# Patient Record
Sex: Female | Born: 1995 | Race: Black or African American | Hispanic: No | Smoking: Former smoker
Health system: Southern US, Community
[De-identification: ages and names within clinical notes are randomized; demographics above are authoritative.]

## PROBLEM LIST (undated history)

## (undated) DIAGNOSIS — Z8041 Family history of malignant neoplasm of ovary: Secondary | ICD-10-CM

## (undated) DIAGNOSIS — G8929 Other chronic pain: Secondary | ICD-10-CM

## (undated) DIAGNOSIS — F419 Anxiety disorder, unspecified: Secondary | ICD-10-CM

## (undated) DIAGNOSIS — R87619 Unspecified abnormal cytological findings in specimens from cervix uteri: Secondary | ICD-10-CM

## (undated) DIAGNOSIS — R1011 Right upper quadrant pain: Secondary | ICD-10-CM

## (undated) DIAGNOSIS — Z808 Family history of malignant neoplasm of other organs or systems: Secondary | ICD-10-CM

## (undated) DIAGNOSIS — Z803 Family history of malignant neoplasm of breast: Secondary | ICD-10-CM

## (undated) DIAGNOSIS — R51 Headache: Secondary | ICD-10-CM

## (undated) DIAGNOSIS — F329 Major depressive disorder, single episode, unspecified: Secondary | ICD-10-CM

## (undated) DIAGNOSIS — R112 Nausea with vomiting, unspecified: Secondary | ICD-10-CM

## (undated) DIAGNOSIS — D649 Anemia, unspecified: Secondary | ICD-10-CM

## (undated) DIAGNOSIS — I10 Essential (primary) hypertension: Secondary | ICD-10-CM

## (undated) DIAGNOSIS — F32A Depression, unspecified: Secondary | ICD-10-CM

## (undated) DIAGNOSIS — Z9889 Other specified postprocedural states: Secondary | ICD-10-CM

## (undated) HISTORY — DX: Family history of malignant neoplasm of other organs or systems: Z80.8

## (undated) HISTORY — DX: Essential (primary) hypertension: I10

## (undated) HISTORY — DX: Unspecified abnormal cytological findings in specimens from cervix uteri: R87.619

## (undated) HISTORY — PX: TUMOR EXCISION: SHX421

## (undated) HISTORY — DX: Family history of malignant neoplasm of breast: Z80.3

## (undated) HISTORY — PX: WISDOM TOOTH EXTRACTION: SHX21

## (undated) HISTORY — DX: Family history of malignant neoplasm of ovary: Z80.41

---
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
## 2013-06-22 ENCOUNTER — Other Ambulatory Visit (HOSPITAL_COMMUNITY): Payer: Self-pay | Admitting: Otolaryngology

## 2013-06-22 NOTE — H&P (Signed)
[place],  Jacqueline Alvarado 17 y.o., female [id number]     Chief Complaint:   Recurrent tonsillitis, snoring, nasal obstrution  HPI: 17-year-old black female has 2 or 3 episodes per year of tonsillitis. She is now 10 days into a course of sore throat and tonsillitis with severe pain, finally slowly better. She has been on amoxicillin for the past 8 days. She does not smoke. She does have chronic allergies. She has never had a peritonsillar abscess.  She does always had some degree of nasal congestion. No facial pain. No active anterior nasal drainage. She does not recall getting treated for sinusitis very often. The last couple of days, she is having some little bit of bleeding from her throat. No breathing difficulty. No change in voice. No neck masses.  3 months recheck for preoperative visit.  She has used her standing amoxicillin at least once since we saw her last.  She tried the Flonase for one or 2 months no apparent effect.   She is accompanied by her mother today.  Mother recognizes loud snoring, but not necessarily any apneic intervals.  She feels like she rests well at night, and awakens energetic in the morning.  She has never been evaluated for possible sleep apnea.   I discussed proposed surgery namely tonsillectomy, possible adenoidectomy.  I also recommend SMR inferior turbinates bilateral.  I discussed each of these surgeries in detail including risks and complications.  Questions were answered and informed consent was obtained.  Postoperative prescriptions for oxycodone liquid and hydrocodone liquid rigid and given.  I will have her see anesthesiology at the surgical center to see if she is appropriate to do as an outpatient, or perhaps needs 23 hour overnight observation for presumptive sleep apnea.   I discussed advancement of diet and activity including return to school and work.  I discussed postoperative nasal hygiene and gave her instructions.    PMH:No past medical history on  file.  Surg Hx:No past surgical history on file.  FHx:  No family history on file. SocHx:  has no tobacco, alcohol, and drug history on file.  ALLERGIES: No Known Allergies   (Not in a hospital admission)  No results found for this or any previous visit (from the past 48 hour(s)). No results found.  ROS:Systemic: Feeling tired (fatigue).  No fever, no night sweats, and no recent weight loss. Head: Headache. Eyes: No eye symptoms. Otolaryngeal: No hearing loss  and no earache.  Tinnitus.  No purulent nasal discharge.  Nasal passage blockage (stuffiness)  and snoring.  No sneezing  and no hoarseness.  Sore throat. Cardiovascular: No chest pain or discomfort  and no palpitations. Pulmonary: No dyspnea, no cough, and no wheezing. Gastrointestinal: Dysphagia.  No heartburn.  No nausea, no abdominal pain, and no melena.  No diarrhea. Genitourinary: No dysuria. Endocrine: No muscle weakness. Musculoskeletal: No calf muscle cramps, no arthralgias, and no soft tissue swelling. Neurological: No dizziness, no fainting, no tingling, and no numbness. Psychological: No anxiety  and no depression. Skin: No rash.  BP:126/85,  HR: 78 b/min,  Height: 67.5 in, Weight: 336 lb, BMI: 51.8 kg/m2,    PHYSICAL EXAM: She is very heavyset.  Mental status is sharp.  She hears well in conversational speech.  The head is atraumatic and neck supple.  Cranial nerves intact.  Ear canals are clear with normal drums.  Anterior nose shows bulky inferior turbinates with minor septal corrugation.  Following Afrin decongestion, the airway is improved on both sides.  Oral   cavity is moist with teeth in good repair and mandible and maxilla of normal configuration.  Oropharynx shows 2-3+ tonsils with a normal soft palate.  Neck unremarkable.   Lungs: Clear to auscultation Heart: Distant, regular rate and rhythm without murmurs. Abdomen: Obese, active Extremities: Obese, normal configuration Neurologic: Grossly intact  and symmetric.    Assessment/Plan Chronic tonsillitis (474.00) (J35.01). 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  We are definitely going to take out your tonsils, and any adenoids that might be left.  I also think we should surgically reduce your nasal inferior turbinates to help with nose breathing and snoring.   If you have sleep apnea, you should stay the night overnight after the surgery.  We will have you visit with the anesthesiologist to see if they think you are safe to do at the outpatient facility.     After the surgery I want you to take either oxycodone or hydrocodone liquid pain medication.  We will remove the nasal packing one day after surgery.  I will see you back at the one week mark, and again at the two-week mark.  No strenuous activities for 2 weeks.  OxyCODONE HCl - 5 MG/5ML Oral Solution;5-10 ml po q4h prn severe pain; Qty300; R0; Rx. Hydrocodone-Acetaminophen 7.5-325 MG/15ML Oral Solution;10-20 ml po q4-6h prn pain; Qty400; R0; Rx.  Gaspar Fowle 06/22/2013, 4:36 PM     

## 2013-06-23 ENCOUNTER — Encounter (HOSPITAL_COMMUNITY)
Admission: RE | Admit: 2013-06-23 | Discharge: 2013-06-23 | Disposition: A | Discharge status: Home / Self Care | Payer: 59 | Source: Ambulatory Visit | Attending: Otolaryngology | Admitting: Otolaryngology

## 2013-06-23 ENCOUNTER — Encounter (HOSPITAL_COMMUNITY): Payer: Self-pay

## 2013-06-23 HISTORY — DX: Nausea with vomiting, unspecified: R11.2

## 2013-06-23 HISTORY — DX: Headache: R51

## 2013-06-23 HISTORY — DX: Other specified postprocedural states: Z98.890

## 2013-06-23 HISTORY — DX: Other specified postprocedural states: R11.2

## 2013-06-23 LAB — CBC
HCT: 40.4 % (ref 36.0–46.0)
Hemoglobin: 13.1 g/dL (ref 12.0–15.0)
MCH: 22.9 pg — ABNORMAL LOW (ref 26.0–34.0)
MCHC: 32.4 g/dL (ref 30.0–36.0)
RBC: 5.71 MIL/uL — ABNORMAL HIGH (ref 3.87–5.11)
RDW: 15.7 % — ABNORMAL HIGH (ref 11.5–15.5)
WBC: 9.5 10*3/uL (ref 4.0–10.5)

## 2013-06-23 MED ORDER — OXYMETAZOLINE HCL 0.05 % NA SOLN
2.0000 | NASAL | Status: DC
  Administered 2013-06-24: 2 via NASAL
  Filled 2013-06-23 (×2): qty 15

## 2013-06-23 MED ORDER — DEXAMETHASONE SODIUM PHOSPHATE 10 MG/ML IJ SOLN
10.0000 mg | Freq: Once | INTRAMUSCULAR | Status: AC
  Administered 2013-06-24: 10 mg via INTRAVENOUS
  Filled 2013-06-23: qty 1

## 2013-06-23 MED ORDER — DEXTROSE 5 % IV SOLN
3.0000 g | Freq: Once | INTRAVENOUS | Status: AC
  Administered 2013-06-24: 3 g via INTRAVENOUS
  Filled 2013-06-23: qty 3000

## 2013-06-23 NOTE — Pre-Procedure Instructions (Signed)
Jacqueline Alvarado  06/23/2013   Your procedure is scheduled on:  Friday, June 24, 2013  Report to [facility] Short Stay (use Main Entrance "A'') at 5:30 AM.  Call this number if you have problems the morning of surgery: [phone]   Remember:   Do not eat food or drink liquids after midnight.   Take these medicines the morning of surgery with A SIP OF WATER: NONE Stop taking Aspirin, vitamins and herbal medications. Do not take any NSAIDs ie: Ibuprofen, Advil, Naproxen or any medication containing Aspirin.  Do not wear jewelry, make-up or nail polish.  Do not wear lotions, powders, or perfumes. You may wear deodorant.  Do not shave 48 hours prior to surgery.   Do not bring valuables to the hospital.  [facility] is not responsible  for any belongings or valuables.               Contacts, dentures or bridgework may not be worn into surgery.  Leave suitcase in the car. After surgery it may be brought to your room.  For patients admitted to the hospital, discharge time is determined by your treatment team.               Patients discharged the day of surgery will not be allowed to drive home.  Name and phone number of your driver:   Special Instructions: Shower using CHG 2 nights before surgery and the night before surgery.  If you shower the day of surgery use CHG.  Use special wash - you have one bottle of CHG for all showers.  You should use approximately 1/3 of the bottle for each shower.   Please read over the following fact sheets that you were given: Pain Booklet, Coughing and Deep Breathing and Surgical Site Infection Prevention

## 2013-06-24 ENCOUNTER — Encounter (HOSPITAL_COMMUNITY)
Admission: RE | Disposition: A | Discharge status: Home / Self Care | Payer: Self-pay | Source: Ambulatory Visit | Attending: Otolaryngology

## 2013-06-24 ENCOUNTER — Encounter (HOSPITAL_COMMUNITY): Payer: Self-pay | Admitting: *Deleted

## 2013-06-24 ENCOUNTER — Observation Stay (HOSPITAL_COMMUNITY)
Admission: RE | Admit: 2013-06-24 | Discharge: 2013-06-25 | Disposition: A | Discharge status: Home / Self Care | Payer: 59 | Source: Ambulatory Visit | Attending: Otolaryngology | Admitting: Otolaryngology

## 2013-06-24 ENCOUNTER — Encounter (HOSPITAL_COMMUNITY): Payer: 59 | Admitting: Anesthesiology

## 2013-06-24 ENCOUNTER — Ambulatory Visit (HOSPITAL_COMMUNITY): Payer: 59 | Admitting: Anesthesiology

## 2013-06-24 DIAGNOSIS — J351 Hypertrophy of tonsils: Secondary | ICD-10-CM | POA: Diagnosis present

## 2013-06-24 DIAGNOSIS — G4733 Obstructive sleep apnea (adult) (pediatric): Secondary | ICD-10-CM | POA: Insufficient documentation

## 2013-06-24 DIAGNOSIS — R0609 Other forms of dyspnea: Secondary | ICD-10-CM | POA: Insufficient documentation

## 2013-06-24 DIAGNOSIS — J3501 Chronic tonsillitis: Principal | ICD-10-CM | POA: Insufficient documentation

## 2013-06-24 DIAGNOSIS — R0989 Other specified symptoms and signs involving the circulatory and respiratory systems: Secondary | ICD-10-CM | POA: Insufficient documentation

## 2013-06-24 DIAGNOSIS — J343 Hypertrophy of nasal turbinates: Secondary | ICD-10-CM | POA: Diagnosis present

## 2013-06-24 HISTORY — PX: NASAL SEPTOPLASTY W/ TURBINOPLASTY: SHX2070

## 2013-06-24 HISTORY — PX: TONSILLECTOMY AND ADENOIDECTOMY: SHX28

## 2013-06-24 SURGERY — TONSILLECTOMY AND ADENOIDECTOMY
Anesthesia: General | Site: Throat | Laterality: Bilateral

## 2013-06-24 MED ORDER — ONDANSETRON HCL 4 MG/2ML IJ SOLN
4.0000 mg | INTRAMUSCULAR | Status: DC | PRN
  Administered 2013-06-24 (×2): 4 mg via INTRAVENOUS
  Filled 2013-06-24 (×2): qty 2

## 2013-06-24 MED ORDER — LIDOCAINE-EPINEPHRINE (PF) 1 %-1:200000 IJ SOLN
INTRAMUSCULAR | Status: AC
  Filled 2013-06-24: qty 10

## 2013-06-24 MED ORDER — GLYCOPYRROLATE 0.2 MG/ML IJ SOLN
INTRAMUSCULAR | Status: DC | PRN
  Administered 2013-06-24: .8 mg via INTRAVENOUS

## 2013-06-24 MED ORDER — LIDOCAINE-EPINEPHRINE 0.5 %-1:200000 IJ SOLN
INTRAMUSCULAR | Status: AC
  Filled 2013-06-24: qty 1

## 2013-06-24 MED ORDER — ARTIFICIAL TEARS OP OINT
TOPICAL_OINTMENT | OPHTHALMIC | Status: DC | PRN
  Administered 2013-06-24: 1 via OPHTHALMIC

## 2013-06-24 MED ORDER — LACTATED RINGERS IV SOLN
INTRAVENOUS | Status: DC | PRN
  Administered 2013-06-24 (×2): via INTRAVENOUS

## 2013-06-24 MED ORDER — CEPHALEXIN 250 MG/5ML PO SUSR
500.0000 mg | Freq: Four times a day (QID) | ORAL | Status: DC
  Administered 2013-06-24 – 2013-06-25 (×3): 500 mg via ORAL
  Filled 2013-06-24 (×7): qty 10

## 2013-06-24 MED ORDER — LIDOCAINE HCL (CARDIAC) 20 MG/ML IV SOLN
INTRAVENOUS | Status: DC | PRN
  Administered 2013-06-24: 50 mg via INTRAVENOUS

## 2013-06-24 MED ORDER — BACITRACIN ZINC 500 UNIT/GM EX OINT
TOPICAL_OINTMENT | CUTANEOUS | Status: AC
  Filled 2013-06-24: qty 15

## 2013-06-24 MED ORDER — ACETAMINOPHEN 160 MG/5ML PO SOLN: 325.0000 mg | ORAL | Status: DC | PRN

## 2013-06-24 MED ORDER — DIPHENHYDRAMINE HCL 50 MG/ML IJ SOLN
25.0000 mg | Freq: Four times a day (QID) | INTRAMUSCULAR | Status: DC | PRN
  Administered 2013-06-24: 25 mg via INTRAVENOUS
  Filled 2013-06-24: qty 1

## 2013-06-24 MED ORDER — OXYCODONE HCL 5 MG/5ML PO SOLN
ORAL | Status: AC
  Filled 2013-06-24: qty 5

## 2013-06-24 MED ORDER — MORPHINE SULFATE 2 MG/ML IJ SOLN
1.0000 mg | INTRAMUSCULAR | Status: DC | PRN
  Administered 2013-06-24 (×4): 2 mg via INTRAVENOUS
  Filled 2013-06-24 (×3): qty 1

## 2013-06-24 MED ORDER — LIDOCAINE-EPINEPHRINE 0.5 %-1:200000 IJ SOLN
INTRAMUSCULAR | Status: DC | PRN
  Administered 2013-06-24: 16 mL

## 2013-06-24 MED ORDER — 0.9 % SODIUM CHLORIDE (POUR BTL) OPTIME
TOPICAL | Status: DC | PRN
  Administered 2013-06-24: 1000 mL

## 2013-06-24 MED ORDER — HYDROCODONE-ACETAMINOPHEN 7.5-325 MG/15ML PO SOLN
10.0000 mL | ORAL | Status: DC | PRN
  Administered 2013-06-24 (×2): 15 mL via ORAL
  Administered 2013-06-25 (×2): 20 mL via ORAL
  Filled 2013-06-24: qty 15
  Filled 2013-06-24 (×2): qty 30
  Filled 2013-06-24: qty 15

## 2013-06-24 MED ORDER — OXYCODONE-ACETAMINOPHEN 5-325 MG/5ML PO SOLN: 5.0000 mL | ORAL | Status: DC | PRN

## 2013-06-24 MED ORDER — HYDROMORPHONE HCL PF 1 MG/ML IJ SOLN
INTRAMUSCULAR | Status: AC
  Administered 2013-06-24: 0.5 mg via INTRAVENOUS
  Filled 2013-06-24: qty 2

## 2013-06-24 MED ORDER — HYDROMORPHONE HCL PF 1 MG/ML IJ SOLN
0.2500 mg | INTRAMUSCULAR | Status: DC | PRN
  Administered 2013-06-24 (×4): 0.5 mg via INTRAVENOUS

## 2013-06-24 MED ORDER — ONDANSETRON HCL 4 MG/2ML IJ SOLN
INTRAMUSCULAR | Status: DC | PRN
  Administered 2013-06-24: 4 mg via INTRAVENOUS

## 2013-06-24 MED ORDER — MIDAZOLAM HCL 5 MG/5ML IJ SOLN
INTRAMUSCULAR | Status: DC | PRN
  Administered 2013-06-24 (×2): 1 mg via INTRAVENOUS

## 2013-06-24 MED ORDER — DEXTROSE-NACL 5-0.45 % IV SOLN
INTRAVENOUS | Status: DC
  Administered 2013-06-24: 15:00:00 via INTRAVENOUS

## 2013-06-24 MED ORDER — ONDANSETRON HCL 4 MG PO TABS: 4.0000 mg | ORAL_TABLET | ORAL | Status: DC | PRN

## 2013-06-24 MED ORDER — ONDANSETRON HCL 4 MG/2ML IJ SOLN
4.0000 mg | Freq: Once | INTRAMUSCULAR | Status: AC | PRN
  Administered 2013-06-24: 4 mg via INTRAVENOUS

## 2013-06-24 MED ORDER — ONDANSETRON HCL 4 MG/2ML IJ SOLN: 4.0000 mg | Freq: Four times a day (QID) | INTRAMUSCULAR | Status: DC | PRN

## 2013-06-24 MED ORDER — HYDROCODONE-ACETAMINOPHEN 7.5-325 MG/15ML PO SOLN
ORAL | Status: AC
  Filled 2013-06-24: qty 15

## 2013-06-24 MED ORDER — PHENYLEPHRINE HCL 10 MG/ML IJ SOLN
INTRAMUSCULAR | Status: DC | PRN
  Administered 2013-06-24: 40 ug via INTRAVENOUS

## 2013-06-24 MED ORDER — PROPOFOL 10 MG/ML IV BOLUS
INTRAVENOUS | Status: DC | PRN
  Administered 2013-06-24: 180 mg via INTRAVENOUS

## 2013-06-24 MED ORDER — NEOSTIGMINE METHYLSULFATE 1 MG/ML IJ SOLN
INTRAMUSCULAR | Status: DC | PRN
  Administered 2013-06-24: 5 mg via INTRAVENOUS

## 2013-06-24 MED ORDER — FENTANYL CITRATE 0.05 MG/ML IJ SOLN
INTRAMUSCULAR | Status: DC | PRN
  Administered 2013-06-24 (×3): 50 ug via INTRAVENOUS
  Administered 2013-06-24: 100 ug via INTRAVENOUS
  Administered 2013-06-24 (×3): 50 ug via INTRAVENOUS

## 2013-06-24 MED ORDER — ONDANSETRON HCL 4 MG/2ML IJ SOLN
INTRAMUSCULAR | Status: AC
  Filled 2013-06-24: qty 2

## 2013-06-24 MED ORDER — ROCURONIUM BROMIDE 100 MG/10ML IV SOLN
INTRAVENOUS | Status: DC | PRN
  Administered 2013-06-24: 10 mg via INTRAVENOUS
  Administered 2013-06-24: 15 mg via INTRAVENOUS
  Administered 2013-06-24: 50 mg via INTRAVENOUS

## 2013-06-24 MED ORDER — MORPHINE SULFATE 2 MG/ML IJ SOLN
INTRAMUSCULAR | Status: AC
  Filled 2013-06-24: qty 1

## 2013-06-24 MED ORDER — BACITRACIN ZINC 500 UNIT/GM EX OINT
TOPICAL_OINTMENT | CUTANEOUS | Status: DC | PRN
  Administered 2013-06-24: 1 via TOPICAL

## 2013-06-24 MED ORDER — LIDOCAINE HCL 4 % MT SOLN
OROMUCOSAL | Status: DC | PRN
  Administered 2013-06-24: 4 mL via TOPICAL

## 2013-06-24 MED ORDER — OXYMETAZOLINE HCL 0.05 % NA SOLN
NASAL | Status: DC | PRN
  Administered 2013-06-24: 1 via NASAL

## 2013-06-24 SURGICAL SUPPLY — 52 items
ATTRACTOMAT 16X20 MAGNETIC DRP (DRAPES) IMPLANT
BLADE TRICUT ROTATE M4 4 5PK (BLADE) IMPLANT
CANISTER SUCTION 2500CC (MISCELLANEOUS) ×6 IMPLANT
CATH ROBINSON RED A/P 10FR (CATHETERS) ×3 IMPLANT
CLEANER TIP ELECTROSURG 2X2 (MISCELLANEOUS) ×3 IMPLANT
CLOTH BEACON ORANGE TIMEOUT ST (SAFETY) ×3 IMPLANT
COAGULATOR SUCT SWTCH 10FR 6 (ELECTROSURGICAL) ×3 IMPLANT
COTTONBALL LRG STERILE PKG (GAUZE/BANDAGES/DRESSINGS) ×3 IMPLANT
CRADLE DONUT ADULT HEAD (MISCELLANEOUS) IMPLANT
DECANTER SPIKE VIAL GLASS SM (MISCELLANEOUS) ×3 IMPLANT
DRESSING TELFA 8X3 (GAUZE/BANDAGES/DRESSINGS) ×3 IMPLANT
DRSG NASOPORE 8CM (GAUZE/BANDAGES/DRESSINGS) IMPLANT
ELECT COATED BLADE 2.86 ST (ELECTRODE) ×3 IMPLANT
ELECT REM PT RETURN 9FT ADLT (ELECTROSURGICAL) ×3
ELECT REM PT RETURN 9FT PED (ELECTROSURGICAL)
ELECTRODE REM PT RETRN 9FT PED (ELECTROSURGICAL) IMPLANT
ELECTRODE REM PT RTRN 9FT ADLT (ELECTROSURGICAL) ×2 IMPLANT
FILTER ARTHROSCOPY CONVERTOR (FILTER) IMPLANT
GAUZE PACKING FOLDED 2  STR (GAUZE/BANDAGES/DRESSINGS) ×1
GAUZE PACKING FOLDED 2 STR (GAUZE/BANDAGES/DRESSINGS) ×2 IMPLANT
GAUZE SPONGE 2X2 8PLY STRL LF (GAUZE/BANDAGES/DRESSINGS) IMPLANT
GAUZE SPONGE 4X4 16PLY XRAY LF (GAUZE/BANDAGES/DRESSINGS) ×3 IMPLANT
GEL ULTRASOUND 20GR AQUASONIC (MISCELLANEOUS) ×3 IMPLANT
GLOVE ECLIPSE 8.0 STRL XLNG CF (GLOVE) ×6 IMPLANT
GOWN PREVENTION PLUS XLARGE (GOWN DISPOSABLE) ×6 IMPLANT
GOWN STRL NON-REIN LRG LVL3 (GOWN DISPOSABLE) ×6 IMPLANT
KIT BASIN OR (CUSTOM PROCEDURE TRAY) ×6 IMPLANT
KIT ROOM TURNOVER OR (KITS) ×6 IMPLANT
NEEDLE SPNL 22GX3.5 QUINCKE BK (NEEDLE) ×3 IMPLANT
NEEDLE SPNL 25GX3.5 QUINCKE BL (NEEDLE) ×3 IMPLANT
NS IRRIG 1000ML POUR BTL (IV SOLUTION) ×6 IMPLANT
PACK SURGICAL SETUP 50X90 (CUSTOM PROCEDURE TRAY) ×3 IMPLANT
PAD ARMBOARD 7.5X6 YLW CONV (MISCELLANEOUS) ×12 IMPLANT
PATTIES SURGICAL .5 X3 (DISPOSABLE) ×3 IMPLANT
PENCIL FOOT CONTROL (ELECTRODE) ×3 IMPLANT
SHEET SIL 040 (INSTRUMENTS) ×3 IMPLANT
SPECIMEN JAR SMALL (MISCELLANEOUS) ×6 IMPLANT
SPONGE GAUZE 2X2 STER 10/PKG (GAUZE/BANDAGES/DRESSINGS)
SPONGE TONSIL 1 RF SGL (DISPOSABLE) ×3 IMPLANT
SUT CHROMIC 4 0 P 3 18 (SUTURE) ×3 IMPLANT
SUT ETHILON 3 0 PS 1 (SUTURE) ×3 IMPLANT
SUT PDS AB 4-0 P3 18 (SUTURE) ×3 IMPLANT
SUT PLAIN 4 0 ~~LOC~~ 1 (SUTURE) IMPLANT
SYR BULB 3OZ (MISCELLANEOUS) ×3 IMPLANT
SYR CONTROL 10ML LL (SYRINGE) ×3 IMPLANT
TOWEL OR 17X24 6PK STRL BLUE (TOWEL DISPOSABLE) ×6 IMPLANT
TRAY ENT MC OR (CUSTOM PROCEDURE TRAY) ×3 IMPLANT
TUBE CONNECTING 12X1/4 (SUCTIONS) ×3 IMPLANT
TUBE SALEM SUMP 14F W/ARV (TUBING) ×3 IMPLANT
TUBE SALEM SUMP 16 FR W/ARV (TUBING) IMPLANT
WATER STERILE IRR 1000ML POUR (IV SOLUTION) ×6 IMPLANT
YANKAUER SUCT BULB TIP NO VENT (SUCTIONS) ×3 IMPLANT

## 2013-06-24 NOTE — Progress Notes (Signed)
Utilization review completed.  

## 2013-06-24 NOTE — H&P (View-Only) (Signed)
Jacqueline Alvarado, [place] 17 y.o., female [id number]     Chief Complaint:   Recurrent tonsillitis, snoring, nasal obstrution  HPI: 17 year old black female has 2 or 3 episodes per year of tonsillitis. She is now 10 days into a course of sore throat and tonsillitis with severe pain, finally slowly better. She has been on amoxicillin for the past 8 days. She does not smoke. She does have chronic allergies. She has never had a peritonsillar abscess.  She does always had some degree of nasal congestion. No facial pain. No active anterior nasal drainage. She does not recall getting treated for sinusitis very often. The last couple of days, she is having some little bit of bleeding from her throat. No breathing difficulty. No change in voice. No neck masses.  3 months recheck for preoperative visit.  She has used her standing amoxicillin at least once since we saw her last.  She tried the [facility] for one or 2 months no apparent effect.   She is accompanied by her mother today.  Mother recognizes loud snoring, but not necessarily any apneic intervals.  She feels like she rests well at night, and awakens energetic in the morning.  She has never been evaluated for possible sleep apnea.   I discussed proposed surgery namely tonsillectomy, possible adenoidectomy.  I also recommend SMR inferior turbinates bilateral.  I discussed each of these surgeries in detail including risks and complications.  Questions were answered and informed consent was obtained.  Postoperative prescriptions for oxycodone liquid and hydrocodone liquid rigid and given.  I will have her see anesthesiology at the surgical center to see if she is appropriate to do as an outpatient, or perhaps needs 23 hour overnight observation for presumptive sleep apnea.   I discussed advancement of diet and activity including return to school and work.  I discussed postoperative nasal hygiene and gave her instructions.    PMH:No past medical history on  file.  Surg Hx:No past surgical history on file.  FHx:  No family history on file. SocHx:  has no tobacco, alcohol, and drug history on file.  ALLERGIES: No Known Allergies   (Not in a hospital admission)  No results found for this or any previous visit (from the past 48 hour(s)). No results found.  WUJ:WJXBJYNW: Feeling tired (fatigue).  No fever, no night sweats, and no recent weight loss. Head: Headache. Eyes: No eye symptoms. Otolaryngeal: No hearing loss  and no earache.  Tinnitus.  No purulent nasal discharge.  Nasal passage blockage (stuffiness)  and snoring.  No sneezing  and no hoarseness.  Sore throat. Cardiovascular: No chest pain or discomfort  and no palpitations. Pulmonary: No dyspnea, no cough, and no wheezing. Gastrointestinal: Dysphagia.  No heartburn.  No nausea, no abdominal pain, and no melena.  No diarrhea. Genitourinary: No dysuria. Endocrine: No muscle weakness. Musculoskeletal: No calf muscle cramps, no arthralgias, and no soft tissue swelling. Neurological: No dizziness, no fainting, no tingling, and no numbness. Psychological: No anxiety  and no depression. Skin: No rash.  BP:126/85,  HR: 78 b/min,  Height: 67.5 in, Weight: 336 lb, BMI: 51.8 kg/m2,    PHYSICAL EXAM: She is very heavyset.  Mental status is sharp.  She hears well in conversational speech.  The head is atraumatic and neck supple.  Cranial nerves intact.  Ear canals are clear with normal drums.  Anterior nose shows bulky inferior turbinates with minor septal corrugation.  Following Afrin decongestion, the airway is improved on both sides.  Oral  cavity is moist with teeth in good repair and mandible and maxilla of normal configuration.  Oropharynx shows 2-3+ tonsils with a normal soft palate.  Neck unremarkable.   Lungs: Clear to auscultation Heart: Distant, regular rate and rhythm without murmurs. Abdomen: Obese, active Extremities: Obese, normal configuration Neurologic: Grossly intact  and symmetric.    Assessment/Plan Chronic tonsillitis (474.00) (J35.01). 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  We are definitely going to take out your tonsils, and any adenoids that might be left.  I also think we should surgically reduce your nasal inferior turbinates to help with nose breathing and snoring.   If you have sleep apnea, you should stay the night overnight after the surgery.  We will have you visit with the anesthesiologist to see if they think you are safe to do at the outpatient facility.     After the surgery I want you to take either oxycodone or hydrocodone liquid pain medication.  We will remove the nasal packing one day after surgery.  I will see you back at the one week mark, and again at the two-week mark.  No strenuous activities for 2 weeks.  OxyCODONE HCl - 5 MG/5ML Oral Solution;5-10 ml po q4h prn severe pain; Qty300; R0; Rx. Hydrocodone-Acetaminophen 7.5-325 MG/15ML Oral Solution;10-20 ml po q4-6h prn pain; Qty400; R0; Rx.  Flo Shanks 06/22/2013, 4:36 PM

## 2013-06-24 NOTE — Anesthesia Preprocedure Evaluation (Addendum)
Anesthesia Evaluation  Patient identified by MRN, date of birth, ID band Patient awake    Reviewed: Allergy & Precautions, H&P , NPO status , Patient's Chart, lab work & pertinent test results  History of Anesthesia Complications (+) PONV and history of anesthetic complications  Airway Mallampati: II TM Distance: >3 FB Neck ROM: Full    Dental  (+) Teeth Intact and Dental Advisory Given   Pulmonary  breath sounds clear to auscultation        Cardiovascular Rhythm:Regular Rate:Normal     Neuro/Psych  Headaches,    GI/Hepatic   Endo/Other  Morbid obesity  Renal/GU      Musculoskeletal   Abdominal   Peds  Hematology   Anesthesia Other Findings   Reproductive/Obstetrics                          Anesthesia Physical Anesthesia Plan  ASA: III  Anesthesia Plan: General   Post-op Pain Management:    Induction: Intravenous  Airway Management Planned: Oral ETT  Additional Equipment:   Intra-op Plan:   Post-operative Plan: Extubation in OR  Informed Consent: I have reviewed the patients History and Physical, chart, labs and discussed the procedure including the risks, benefits and alternatives for the proposed anesthesia with the patient or authorized representative who has indicated his/her understanding and acceptance.   Dental advisory given  Plan Discussed with: Anesthesiologist and Surgeon  Anesthesia Plan Comments: (Chronic tonsillitis Obesity Nasal septal deviation  Plan GA with oral ETT  Kipp Brood, MD)       Anesthesia Quick Evaluation

## 2013-06-24 NOTE — Interval H&P Note (Signed)
History and Physical Interval Note:  06/24/2013 7:35 AM  Jacqueline Alvarado  has presented today for surgery, with the diagnosis of chronic tonsilitis  The various methods of treatment have been discussed with the patient and family. After consideration of risks, benefits and other options for treatment, the patient has consented to  Procedure(s): TONSILLECTOMY AND ADENOIDECTOMY (Bilateral) TURBINATE REDUCTION (Bilateral) as a surgical intervention .  The patient's history has been re-reviewed, patient re-examined, no change in status, stable for surgery.  I have re-reviewed the patient's chart and labs.  Questions were answered to the patient's satisfaction.     Flo Shanks

## 2013-06-24 NOTE — Progress Notes (Signed)
Called on-call Dr to report skipped beats; more than rare; occasional. Mostly SR, but ectopy noticed. No new orders; will continue to monitor.

## 2013-06-24 NOTE — Anesthesia Procedure Notes (Signed)
Procedure Name: Intubation Date/Time: 06/24/2013 7:51 AM Performed by: Gayla Medicus Pre-anesthesia Checklist: Patient identified, Patient being monitored, Emergency Drugs available, Timeout performed and Suction available Patient Re-evaluated:Patient Re-evaluated prior to inductionOxygen Delivery Method: Circle system utilized Preoxygenation: Pre-oxygenation with 100% oxygen Intubation Type: IV induction Ventilation: Mask ventilation without difficulty, Oral airway inserted - appropriate to patient size and Two handed mask ventilation required Laryngoscope Size: Mac and 3 Grade View: Grade I Tube type: Oral Tube size: 7.0 mm Number of attempts: 1 Airway Equipment and Method: Stylet and LTA kit utilized Placement Confirmation: ETT inserted through vocal cords under direct vision,  positive ETCO2 and breath sounds checked- equal and bilateral Secured at: 22 cm Tube secured with: Tape Dental Injury: Teeth and Oropharynx as per pre-operative assessment

## 2013-06-24 NOTE — Transfer of Care (Signed)
Immediate Anesthesia Transfer of Care Note  Patient: Jacqueline Alvarado  Procedure(s) Performed: Procedure(s): TONSILLECTOMY  (Bilateral) TURBINATE REDUCTION (Bilateral)  Patient Location: PACU  Anesthesia Type:General  Level of Consciousness: awake, alert  and oriented  Airway & Oxygen Therapy: Patient Spontanous Breathing and Patient connected to face mask oxygen  Post-op Assessment: Report given to PACU RN, Post -op Vital signs reviewed and stable and Patient moving all extremities X 4  Post vital signs: Reviewed and stable  Complications: No apparent anesthesia complications

## 2013-06-24 NOTE — Preoperative (Signed)
Beta Blockers   Reason not to administer Beta Blockers:Not Applicable 

## 2013-06-24 NOTE — Op Note (Signed)
06/24/2013  9:37 AM    Harland Dingwall, Zigmund Daniel  [id number]   Pre-Op Dx:  Chronic tonsillitis with hypertrophy. Inferior turbinate hypertrophy with obstruction. Morbid obesity. Obstructive sleep apnea. Or or  Post-op Dx: same  Proc: Bilateral SMR inferior turbinates. Tonsillectomy   Surg:  Flo Shanks T MD  Anes:  GOT  EBL:  50  ml  Comp:  none  Findings:  Relatively straight nasal septum.  Bulky inferior turbinates.  2+ tonsils with extremely difficult access owing to obesity, small and short mandible, very bulky tongue.  No residual adenoids.  Procedure:   with the patient in a comfortable supine position, having received preoperative Afrin spray, general orotracheal anesthesia was induced with some difficulty owing to her anatomy including small short mandible, bulky tongue, and obesity.  At an adequate level, the table was turned 90 and  the patient placed in a semisitting position.  The nose was examined with the findings as scribed above. A saline moistened throat pack was placed. Afrin moistened pledgets were applied along the septum and turbinates on both sides for intraoperative hemostasis. 1/2% Xylocaine with 1-200,000 epinephrine, 6 cc total was infiltrated into the inferior turbinates using a 25-gauge spinal needle. Several minutes were allowed for this to take effect.  Beginning on the right side, the anterior hood of the inferior turbinate was sharply lysed just behind the nasal valve. The medial mucosa was sharply incised and a laterally based flap was developed. The turbinate was infractured. Using angled turbinate scissors, turbinate bone and lateral mucosa were resected in a posterior downsloping fashion leaving virtually all the posterior pole and taking virtually all the anterior pole. Bony spicules were removed submucosally. The cut mucosal edges were suction coagulated the flap was laid back down and the turbinate was outfractured. This completed the right inferior  turbinate submucosal resection.  The left side was performed in identical fashion.  A double thickness of Telfa packing was fashioned and impregnated with bacitracin ointment and applied against the turbinate on both sides. Nasal trumpets were placed after first shortening them to allow some postoperative airway.  This completed the nasal procedure. Hemostasis was observed.  The patient was flattened and then placed in a true Trendelenburg. the draping was adjusted. With a #3 slotted blade in the Crowe Davis mouth gag, introduction was possible but difficult but the tongue was still completely obliterating visualization of the entire pharynx. A #4, and then #5 flat blade were also attempted with poor visualization and difficulty controlling the tongue. Finally a #4 slotted blade was positioned. With the surgical nurse holding the gag, it was possible to see most of one tonsil at a time.  A palate retractor and mirror were used to visualize the nasopharynx with no obvious residual adenoids.  1/2% Xylocaine with 1-200,000 epinephrine, 8 cc total was infiltrated into the peritonsillar planes for intraoperative hemostasis.  Beginning on the right side, the tonsil was grasped and retracted medially. The mucosa overlying the anterior and superior poles was coagulated and then cut down to the capsule of the tonsil. Working on the capsule inferiorly through separate its muscular fossa and then brought upward and finally delivered from the superior pole. Several small bleeding sites were controlled with cautery. Pesky bleeding was encountered under the cut margin at the base of tongue and finally controlled with indirect vision using suction cautery.     The mouthgag was repositioned and the left tonsillectomy was performed. Minimal bleeding was controlled with cautery.  An orogastric tube was passed  and a fairly large quantity of pale green clear secretions was evacuated. The orogastric tube was  removed.  Hemostasis was observed in the pharynx. Once again the palate retractor and mirror were used to inspect the nasopharynx and again no significant adenoids noted under conditions of better visualization.    The mouthgag was relaxed for possibly 5 minutes. Upon reexpansion, hemostasis was observed. At this point the procedure was completed. The mouthgag was relaxed and removed. There may have been minor excoriations on the edges of the anterior upper central incisors.  Otherwise dental status was intact.  Patient was returned to anesthesia, awakened, extubated, and transferred to recovery in stable condition.  Dispo:   PACU to step down unit for 23 hr overnight observation  Plan:  Ice, elevation, analgesia.  Antibiosis x 5 days.  Nasal packs and tubes out 24 hrs.  Nasal hygiene measures.    Cephus Richer MD

## 2013-06-24 NOTE — Anesthesia Postprocedure Evaluation (Signed)
  Anesthesia Post-op Note  Patient: Jacqueline Alvarado  Procedure(s) Performed: Procedure(s): TONSILLECTOMY  (Bilateral) TURBINATE REDUCTION (Bilateral)  Patient Location: PACU  Anesthesia Type:General  Level of Consciousness: awake, alert  and oriented  Airway and Oxygen Therapy: Patient Spontanous Breathing  Post-op Pain: mild  Post-op Assessment: Post-op Vital signs reviewed, Patient's Cardiovascular Status Stable, Respiratory Function Stable, Patent Airway, No signs of Nausea or vomiting and Pain level controlled  Post-op Vital Signs: stable  Complications: No apparent anesthesia complications

## 2013-06-25 NOTE — Discharge Summary (Signed)
06/25/2013  8:15 AM  Date of Admission:06/24/2013 Date of Discharge:06/25/2013  Discharge ON:GEXB, Clovis Riley, MD  Admitting MW:[facility] MD  Reason for admission/final discharge diagnosis: sleep apnea/snoring tonsillar hypertrophy, turbinate hypertrophy  Labs:see EPIC  Procedure(s) performed: tonsillectomy and turbinate reduction.  Discharge Condition:good  Discharge Exam:removed nasal trumpets and telfa sponges, oral cavity and nasal cavity hemostatic with tonsils surgically absent.  Discharge Instructions: Post-tonsillectomy diet as tolerated, Rx on chart, follow up with Dr. Lazarus Salines in 1-2 weeks. No nose blowing, no lifting >25lbs until follow up.  Hospital Course: did well post-op, took good PO, stable vitals, discharged on POD#1 after nasal trumpets and telfa removed. RN reported some asymptomatic skipped beats on telemetry.  Melvenia Beam 8:15 AM 06/25/2013

## 2013-06-25 NOTE — Progress Notes (Signed)
Discharge instructions given to pt and family -- both verbalized and taught-back understanding of follow-up appts, appropriate pain management, use of drip-pad gauze, advancing diet, activity level, s/s of complications and when to call MD. Tonsillectomy information packet given. Renette Butters, Viona Gilmore

## 2013-06-28 ENCOUNTER — Encounter (HOSPITAL_COMMUNITY): Payer: Self-pay | Admitting: Otolaryngology

## 2013-08-15 ENCOUNTER — Telehealth: Payer: Self-pay | Admitting: *Deleted

## 2013-08-15 MED ORDER — METRONIDAZOLE 500 MG PO TABS: 500.0000 mg | ORAL_TABLET | Freq: Two times a day (BID) | ORAL | Status: DC

## 2013-08-15 NOTE — Telephone Encounter (Signed)
Complains of BV  wiil rx flagyl

## 2014-10-01 ENCOUNTER — Emergency Department (HOSPITAL_COMMUNITY)
Admission: EM | Admit: 2014-10-01 | Discharge: 2014-10-01 | Disposition: A | Discharge status: Home / Self Care | Payer: BLUE CROSS/BLUE SHIELD | Attending: Emergency Medicine | Admitting: Emergency Medicine

## 2014-10-01 ENCOUNTER — Encounter (HOSPITAL_COMMUNITY): Payer: Self-pay | Admitting: Nurse Practitioner

## 2014-10-01 DIAGNOSIS — S01511A Laceration without foreign body of lip, initial encounter: Secondary | ICD-10-CM

## 2014-10-01 DIAGNOSIS — T148XXA Other injury of unspecified body region, initial encounter: Secondary | ICD-10-CM

## 2014-10-01 DIAGNOSIS — W540XXA Bitten by dog, initial encounter: Secondary | ICD-10-CM | POA: Insufficient documentation

## 2014-10-01 DIAGNOSIS — Z862 Personal history of diseases of the blood and blood-forming organs and certain disorders involving the immune mechanism: Secondary | ICD-10-CM | POA: Diagnosis not present

## 2014-10-01 DIAGNOSIS — Y9289 Other specified places as the place of occurrence of the external cause: Secondary | ICD-10-CM | POA: Insufficient documentation

## 2014-10-01 DIAGNOSIS — Z792 Long term (current) use of antibiotics: Secondary | ICD-10-CM | POA: Insufficient documentation

## 2014-10-01 DIAGNOSIS — Y9389 Activity, other specified: Secondary | ICD-10-CM | POA: Diagnosis not present

## 2014-10-01 DIAGNOSIS — Z791 Long term (current) use of non-steroidal anti-inflammatories (NSAID): Secondary | ICD-10-CM | POA: Diagnosis not present

## 2014-10-01 DIAGNOSIS — S0993XA Unspecified injury of face, initial encounter: Secondary | ICD-10-CM | POA: Diagnosis present

## 2014-10-01 DIAGNOSIS — Y998 Other external cause status: Secondary | ICD-10-CM | POA: Insufficient documentation

## 2014-10-01 MED ORDER — HYDROCODONE-ACETAMINOPHEN 5-325 MG PO TABS: 2.0000 | ORAL_TABLET | ORAL | Status: DC | PRN

## 2014-10-01 MED ORDER — LIDOCAINE-EPINEPHRINE 2 %-1:100000 IJ SOLN
20.0000 mL | Freq: Once | INTRAMUSCULAR | Status: AC
  Administered 2014-10-01: 20 mL
  Filled 2014-10-01: qty 20

## 2014-10-01 MED ORDER — BACITRACIN ZINC 500 UNIT/GM EX OINT: 1.0000 "application " | TOPICAL_OINTMENT | Freq: Three times a day (TID) | CUTANEOUS | Status: DC

## 2014-10-01 MED ORDER — AMOXICILLIN-POT CLAVULANATE 875-125 MG PO TABS: 1.0000 | ORAL_TABLET | Freq: Two times a day (BID) | ORAL | Status: DC

## 2014-10-01 MED ORDER — OXYCODONE-ACETAMINOPHEN 5-325 MG PO TABS
2.0000 | ORAL_TABLET | Freq: Once | ORAL | Status: AC
  Administered 2014-10-01: 2 via ORAL
  Filled 2014-10-01: qty 2

## 2014-10-01 MED ORDER — CHLORHEXIDINE GLUCONATE 0.12 % MT SOLN: 15.0000 mL | Freq: Two times a day (BID) | OROMUCOSAL | Status: DC

## 2014-10-01 NOTE — ED Notes (Signed)
NAD at this time. Pt is stable and leaving with her mom.

## 2014-10-01 NOTE — ED Provider Notes (Signed)
Face-to-face evaluation   History: She was bitten, by a family friend's dog. She has injuries to upper and lower lip.  Physical exam: Irregular deep lidocaine, laceration, with retraction of the flap segment through the upper lip, vermilion border and tissue beneath the nose. Laceration is greater than 4 cm, in the on approximated state. Small laceration close to the areolar border of the left lower lip; measures approximately 1.5 cm in the unapproximated state.  15:10-Consult ENT, trauma surgeon- Dr. Jeanice Limurham- he will evaluate and treat the patient in the ED, and suture her lacerations.  Medical screening examination/treatment/procedure(s) were conducted as a shared visit with non-physician practitioner(s) and myself.  I personally evaluated the patient during the encounter  Mancel BaleElliott Peytin Dechert, MD 10/02/14 1024

## 2014-10-01 NOTE — ED Provider Notes (Signed)
CSN: [id number]     Arrival date & time 10/01/14  1342 History   First MD Initiated Contact with Patient 10/01/14 1409     Chief Complaint  Patient presents with  . Animal Bite     (Consider location/radiation/quality/duration/timing/severity/associated sxs/prior Treatment) HPI Jacqueline Alvarado is a 19 y.o. female who comes in for evaluation of lip laceration following a dog bite. Patient states at 1245 she was visiting a friend when the friend's dog, a chihuahua mix, became aggressive and bit her upper lip. Dog is known and is up-to-date on vaccinations. Patient's last tetanus shot was 2013. Reports numbness and tingling to the upper lip now also reports sharp pain as a 9.5/10. Denies fevers, chills, jaw pain, N/V. No other aggravating or modifying factors.  Past Medical History  Diagnosis Date  . PONV (postoperative nausea and vomiting)     Hx: of nausea only at age 19  . Headache(784.0)   . Low iron     Hx: of   Past Surgical History  Procedure Laterality Date  . Tumor excision      Hx: of right side of neck  . Tonsillectomy and adenoidectomy Bilateral 06/24/2013    Procedure: TONSILLECTOMY ;  Surgeon: Flo ShanksKarol Wolicki, MD;  [facility];  Service: ENT;  Laterality: Bilateral;  . Nasal septoplasty w/ turbinoplasty Bilateral 06/24/2013    Procedure: TURBINATE REDUCTION;  Surgeon: Flo ShanksKarol Wolicki, MD;  [facility];  Service: ENT;  Laterality: Bilateral;   Family History  Problem Relation Age of Onset  . Cancer - Other Mother   . Hypertension Father   . Cancer - Cervical Other   . Diabetes Other   . Stroke Other    History  Substance Use Topics  . Smoking status: Never Smoker   . Smokeless tobacco: Never Used  . Alcohol Use: No   OB History    No data available     Review of Systems A 10 point review of systems was completed and was negative except for pertinent positives and negatives as mentioned in the history of present illness     Allergies  Review of  patient's allergies indicates no known allergies.  Home Medications   Prior to Admission medications   Medication Sig Start Date End Date Taking? Authorizing Provider  naproxen (NAPROSYN) 500 MG tablet Take 500 mg by mouth 2 (two) times daily. 08/18/14  Yes Historical Provider, MD  metroNIDAZOLE (FLAGYL) 500 MG tablet Take 1 tablet (500 mg total) by mouth 2 (two) times daily. Patient not taking: Reported on 10/01/2014 08/15/13   Adline PotterJennifer A Griffin, NP   BP 147/98 mmHg  Pulse 95  Temp(Src) 97.8 F (36.6 C) (Oral)  Resp 22  SpO2 99% Physical Exam  Constitutional: She is oriented to person, place, and time. She appears well-developed and well-nourished.  HENT:  Head: Normocephalic and atraumatic.  Mouth/Throat: Oropharynx is clear and moist.  Jagged laceration to upper lip that crosses vermilion border multiple times. No obvious full thickness penetration of lip. Also smaller lac to lower left lip. No trismus. Tolerating secretions. Patent airway. Please see picture.  Eyes: Conjunctivae are normal. Pupils are equal, round, and reactive to light. Right eye exhibits no discharge. Left eye exhibits no discharge. No scleral icterus.  Neck: Neck supple.  Cardiovascular: Normal rate, regular rhythm and normal heart sounds.   Pulmonary/Chest: Effort normal and breath sounds normal. No respiratory distress. She has no wheezes. She has no rales.  Abdominal: Soft. There is no tenderness.  Musculoskeletal: She exhibits no tenderness.  Neurological: She is alert and oriented to person, place, and time.  Cranial Nerves II-XII grossly intact  Skin: Skin is warm and dry. No rash noted.  Psychiatric: She has a normal mood and affect.  Nursing note and vitals reviewed.     ED Course  Procedures (including critical care time) Labs Review Labs Reviewed - No data to display  Imaging Review No results found.   EKG Interpretation None     Meds given in ED:  Medications  lidocaine-EPINEPHrine  (XYLOCAINE W/EPI) 2 %-1:100000 (with pres) injection 20 mL (not administered)  oxyCODONE-acetaminophen (PERCOCET/ROXICET) 5-325 MG per tablet 2 tablet (2 tablets Oral Given 10/01/14 1443)    New Prescriptions   No medications on file   Filed Vitals:   10/01/14 1354  BP: 147/98  Pulse: 95  Temp: 97.8 F (36.6 C)  TempSrc: Oral  Resp: 22  SpO2: 99%    MDM  Vitals stable - WNL -afebrile Pt resting comfortably in ED. Discussed case and pt presentation with Dr. Effie Shy. Consult to Oral surgery. Dr Jeanice Lim to see in ED.  Pt care signed out to Ivar Drape, PA-C for recommendations per oral surgery.  Pt stable, in good condition and is appropriate for care sign out.  Final diagnoses:  Animal bite  Lip laceration, initial encounter        Joycie Peek, PA-C 10/01/14 1704  Mancel Bale, MD 10/02/14 1024

## 2014-10-01 NOTE — ED Notes (Addendum)
Pt was bitten on lips and scratched on L arm by dog. Avulsion wound and lacerations to upper lip/lower lip through vermilion border. Minimal bleeding now. Pt states the pain is tingling and numb. Pt reports she knows the dogs owner and the dog does have rabies vaccination up to date

## 2014-10-01 NOTE — ED Provider Notes (Signed)
4:48 PM Patient signed out to me by Cartner, PA-C.  Plan:  Patient to be seen and sutured by Dr. Jeanice Limurham of oral surgery.  Dispo per Dr. Jeanice Limurham.  5:19 PM Dr. Jeanice Limurham is bedside.  6:46 PM Appreciate Dr. Jeanice Limurham for repairing complex facial laceration.  Dr. Jeanice Limurham recommends discharge with augmentin x 10 days, bacitracin TID, peridex, and pain control. Discussed the plan with the patient who understands and agrees with the plan.  Roxy Horsemanobert Raiya Stainback, PA-C 10/01/14 1848  Rolland PorterMark James, MD 10/01/14 [phone]

## 2014-10-01 NOTE — Consult Note (Signed)
Oral & Maxillofacial Surgery   Reason for Consult: Dog Bite Referring Physician: Dr. Gray BernhardtElliot Alvarado  Jacqueline Alvarado is an 19 y.o. female.  HPI: The patient is a 19 year-old [place] female that was bitten in the face by a friends Jacqueline Alvarado today at the friend's home.  Her upper and lower lips were involved so I was consulted by Dr. Effie ShyWentz.  PMHx:  Past Medical History  Diagnosis Date  . PONV (postoperative nausea and vomiting)     Hx: of nausea only at age 19  . Headache(784.0)   . Low iron     Hx: of    PSx:  Past Surgical History  Procedure Laterality Date  . Tumor excision      Hx: of right side of neck  . Tonsillectomy and adenoidectomy Bilateral 06/24/2013    Procedure: TONSILLECTOMY ;  Surgeon: Jacqueline ShanksKarol Wolicki, MD;  [facility];  Service: ENT;  Laterality: Bilateral;  . Nasal septoplasty w/ turbinoplasty Bilateral 06/24/2013    Procedure: TURBINATE REDUCTION;  Surgeon: Jacqueline ShanksKarol Wolicki, MD;  [facility];  Service: ENT;  Laterality: Bilateral;    Family Hx:  Family History  Problem Relation Age of Onset  . Cancer - Other Mother   . Hypertension Father   . Cancer - Cervical Other   . Diabetes Other   . Stroke Other     Social Hx:  reports that she has never smoked. She has never used smokeless tobacco. She reports that she does not drink alcohol or use illicit drugs.  Allergies: No Known Allergies  Medications: I have reviewed the patient's current medications.  Labs: No results found for this or any previous visit (from the past 48 hour(s)).  Radiology: No results found.  ZOX:[id number]:Pertinent items are noted in HPI.  Vital Signs: BP 102/60 mmHg  Pulse 75  Temp(Src) 97.8 F (36.6 C) (Oral)  Resp 22  SpO2 100%  Physical Exam: General appearance: alert and cooperative Head: Normocephalic Eyes: conjunctivae/corneas clear. PERRL, EOM's intact. Fundi benign. Ears: normal TM's and external ear canals both ears Nose: Nares normal. Septum midline. Mucosa  normal. No drainage or sinus tenderness. Throat: abnormal findings: edema of lips and Lip lacerations  Three lacerations:  Upper lip - large 4 cm complex, stellate laceration through skin and orbicularis muscle Upper labial mucosa - 1 cm simple laceration just to the right of the frenum attachment Lower lip - 2 cm stellate laceration through skin and orbicularis muscle   Assessment/Plan: The patient is s/p dog bite to face with a laceration of her upper and lower lip and labial mucosa. 1. Debridement of the wound and copious irrigation with Normal Saline 2. Iodine preparation of the face and the face was draped.   3. 19 mL of Lidocaine with 1:100,000 epinephrine 4. Layered closure of all three lacerations with 4-0 Vicryl, 5-0 Prolene, and 3-0 Gut suture.   5. Follow up in one week in my office  6. I recommend: Augmentin 875 bid x 10 days, pain medication, peridex, and bacitracin to the wound tid at discharge.   Alvarado,Jacqueline Dombek L  10/01/2014, 7:07 PM

## 2014-10-01 NOTE — ED Notes (Signed)
Oral surgeon still at bedside.

## 2014-10-01 NOTE — ED Notes (Signed)
Oral surgeon at bedside.

## 2014-10-01 NOTE — Discharge Instructions (Signed)

## 2015-02-20 ENCOUNTER — Other Ambulatory Visit (HOSPITAL_COMMUNITY): Payer: Self-pay | Admitting: Family Medicine

## 2015-02-20 DIAGNOSIS — R109 Unspecified abdominal pain: Secondary | ICD-10-CM

## 2015-02-20 DIAGNOSIS — R110 Nausea: Secondary | ICD-10-CM

## 2015-02-23 ENCOUNTER — Other Ambulatory Visit (HOSPITAL_COMMUNITY): Payer: Self-pay | Admitting: Family Medicine

## 2015-02-23 ENCOUNTER — Ambulatory Visit (HOSPITAL_COMMUNITY)
Admission: RE | Admit: 2015-02-23 | Discharge: 2015-02-23 | Disposition: A | Discharge status: Home / Self Care | Payer: BLUE CROSS/BLUE SHIELD | Source: Ambulatory Visit | Attending: Family Medicine | Admitting: Family Medicine

## 2015-02-23 DIAGNOSIS — R110 Nausea: Secondary | ICD-10-CM

## 2015-02-23 DIAGNOSIS — R109 Unspecified abdominal pain: Secondary | ICD-10-CM

## 2015-02-23 DIAGNOSIS — R1011 Right upper quadrant pain: Secondary | ICD-10-CM | POA: Diagnosis not present

## 2015-04-13 ENCOUNTER — Encounter (HOSPITAL_COMMUNITY): Payer: Self-pay | Admitting: Emergency Medicine

## 2015-04-13 ENCOUNTER — Emergency Department (HOSPITAL_COMMUNITY)
Admission: EM | Admit: 2015-04-13 | Discharge: 2015-04-13 | Disposition: A | Discharge status: Home / Self Care | Payer: BLUE CROSS/BLUE SHIELD | Attending: Emergency Medicine | Admitting: Emergency Medicine

## 2015-04-13 DIAGNOSIS — Z862 Personal history of diseases of the blood and blood-forming organs and certain disorders involving the immune mechanism: Secondary | ICD-10-CM | POA: Insufficient documentation

## 2015-04-13 DIAGNOSIS — Z791 Long term (current) use of non-steroidal anti-inflammatories (NSAID): Secondary | ICD-10-CM | POA: Diagnosis not present

## 2015-04-13 DIAGNOSIS — Z792 Long term (current) use of antibiotics: Secondary | ICD-10-CM | POA: Insufficient documentation

## 2015-04-13 DIAGNOSIS — K644 Residual hemorrhoidal skin tags: Secondary | ICD-10-CM | POA: Diagnosis not present

## 2015-04-13 DIAGNOSIS — K625 Hemorrhage of anus and rectum: Secondary | ICD-10-CM | POA: Diagnosis not present

## 2015-04-13 DIAGNOSIS — R195 Other fecal abnormalities: Secondary | ICD-10-CM | POA: Insufficient documentation

## 2015-04-13 LAB — CBC WITH DIFFERENTIAL/PLATELET
BASOS ABS: 0 10*3/uL (ref 0.0–0.1)
Basophils Relative: 0 %
EOS PCT: 4 %
Eosinophils Absolute: 0.4 10*3/uL (ref 0.0–0.7)
HCT: 34.7 % — ABNORMAL LOW (ref 36.0–46.0)
Hemoglobin: 11 g/dL — ABNORMAL LOW (ref 12.0–15.0)
Lymphocytes Relative: 27 %
Lymphs Abs: 2.7 10*3/uL (ref 0.7–4.0)
MCH: 22.4 pg — ABNORMAL LOW (ref 26.0–34.0)
MCHC: 31.7 g/dL (ref 30.0–36.0)
MCV: 70.7 fL — ABNORMAL LOW (ref 78.0–100.0)
Monocytes Absolute: 0.9 10*3/uL (ref 0.1–1.0)
Monocytes Relative: 9 %
Neutro Abs: 6 10*3/uL (ref 1.7–7.7)
Neutrophils Relative %: 60 %
Platelets: 310 10*3/uL (ref 150–400)
RBC: 4.91 MIL/uL (ref 3.87–5.11)
RDW: 15.9 % — AB (ref 11.5–15.5)
WBC: 10 10*3/uL (ref 4.0–10.5)

## 2015-04-13 LAB — COMPREHENSIVE METABOLIC PANEL
ALT: 18 U/L (ref 14–54)
AST: 20 U/L (ref 15–41)
Albumin: 3.4 g/dL — ABNORMAL LOW (ref 3.5–5.0)
Alkaline Phosphatase: 33 U/L — ABNORMAL LOW (ref 38–126)
Anion gap: 5 (ref 5–15)
BUN: 14 mg/dL (ref 6–20)
CO2: 24 mmol/L (ref 22–32)
CREATININE: 0.75 mg/dL (ref 0.44–1.00)
Calcium: 8 mg/dL — ABNORMAL LOW (ref 8.9–10.3)
Chloride: 109 mmol/L (ref 101–111)
GFR calc Af Amer: >60 mL/min (ref >60)
GFR calc non Af Amer: >60 mL/min (ref >60)
Glucose, Bld: 91 mg/dL (ref 65–99)
POTASSIUM: 3.8 mmol/L (ref 3.5–5.1)
SODIUM: 138 mmol/L (ref 135–145)
Total Bilirubin: 0.3 mg/dL (ref 0.3–1.2)
Total Protein: 6.8 g/dL (ref 6.5–8.1)

## 2015-04-13 LAB — LIPASE, BLOOD: Lipase: 24 U/L (ref 22–51)

## 2015-04-13 LAB — POC OCCULT BLOOD, ED: FECAL OCCULT BLD: NEGATIVE

## 2015-04-13 MED ORDER — HYDROCORTISONE ACETATE 25 MG RE SUPP: 25.0000 mg | Freq: Two times a day (BID) | RECTAL | Status: DC

## 2015-04-13 NOTE — Discharge Instructions (Signed)
Rectal Bleeding °Rectal bleeding is when blood passes out of the anus. It is usually a sign that something is wrong. It may not be serious, but it should always be evaluated. Rectal bleeding may present as bright red blood or extremely dark stools. The color may range from dark red or maroon to black (like tar). It is important that the cause of rectal bleeding be identified so treatment can be started and the problem corrected. °CAUSES  °· Hemorrhoids. These are enlarged (dilated) blood vessels or veins in the anal or rectal area. °· Fistulas. These are abnormal, burrowing channels that usually run from inside the rectum to the skin around the anus. They can bleed. °· Anal fissures. This is a tear in the tissue of the anus. Bleeding occurs with bowel movements. °· Diverticulosis. This is a condition in which pockets or sacs project from the bowel wall. Occasionally, the sacs can bleed. °· Diverticulitis. This is an infection involving diverticulosis of the colon. °· Proctitis and colitis. These are conditions in which the rectum, colon, or both, can become inflamed and pitted (ulcerated). °· Polyps and cancer. Polyps are non-cancerous (benign) growths in the colon that may bleed. Certain types of polyps turn into cancer. °· Protrusion of the rectum. Part of the rectum can project from the anus and bleed. °· Certain medicines. °· Intestinal infections. °· Blood vessel abnormalities. °HOME CARE INSTRUCTIONS °· Eat a high-fiber diet to keep your stool soft. °· Limit activity. °· Drink enough fluids to keep your urine clear or pale yellow. °· Warm baths may be useful to soothe rectal pain. °· Follow up with your caregiver as directed. °SEEK IMMEDIATE MEDICAL CARE IF: °· You develop increased bleeding. °· You have black or dark red stools. °· You vomit blood or material that looks like coffee grounds. °· You have abdominal pain or tenderness. °· You have a fever. °· You feel weak, nauseous, or you faint. °· You have  severe rectal pain or you are unable to have a bowel movement. °MAKE SURE YOU: °· Understand these instructions. °· Will watch your condition. °· Will get help right away if you are not doing well or get worse. °Document Released: 12/20/2001 Document Revised: 09/22/2011 Document Reviewed: 12/15/2010 °ExitCare® Patient Information ©2015 [employer]. This information is not intended to replace advice given to you by your health care provider. Make sure you discuss any questions you have with your health care provider. ° °

## 2015-04-13 NOTE — ED Notes (Signed)
Pt c/o bright red bloody stool x 7 since Wednesday. Right side abd pain. Some nausea. Denies vomiting.

## 2015-04-13 NOTE — ED Provider Notes (Signed)
CSN: [id number]     Arrival date & time 04/13/15  1716 History   First MD Initiated Contact with Patient 04/13/15 1726     Chief Complaint  Patient presents with  . Rectal Bleeding     (Consider location/radiation/quality/duration/timing/severity/associated sxs/prior Treatment) HPI Comments: Patient presents to the ER for evaluation of rectal bleeding. Patient reports that symptoms began 2 days ago. She reports that she has noticed bright red blood from her rectum 7 times since then. She has not had any associated fever. There is no rectal pain. She does report a history of hemorrhoids, but there is no swelling or pain in her hemorrhoid area. Patient noted. See any shortness of breath, or palpitations. She is experiencing right upper quadrant pain but this has been ongoing for months. She has seen her primary doctor for this, had an ultrasound which was negative.  Patient is a 19 y.o. female presenting with hematochezia.  Rectal Bleeding Associated symptoms: abdominal pain     Past Medical History  Diagnosis Date  . PONV (postoperative nausea and vomiting)     Hx: of nausea only at age 57  . Headache(784.0)   . Low iron     Hx: of   Past Surgical History  Procedure Laterality Date  . Tumor excision      Hx: of right side of neck  . Tonsillectomy and adenoidectomy Bilateral 06/24/2013    Procedure: TONSILLECTOMY ;  Surgeon: Flo Shanks, MD;  Location: [facility] OR;  Service: ENT;  Laterality: Bilateral;  . Nasal septoplasty w/ turbinoplasty Bilateral 06/24/2013    Procedure: TURBINATE REDUCTION;  Surgeon: Flo Shanks, MD;  Location: [facility] OR;  Service: ENT;  Laterality: Bilateral;   Family History  Problem Relation Age of Onset  . Cancer - Other Mother   . Hypertension Father   . Cancer - Cervical Other   . Diabetes Other   . Stroke Other    Social History  Substance Use Topics  . Smoking status: Never Smoker   . Smokeless tobacco: Never Used  . Alcohol Use: Yes     Comment:  occasional   OB History    No data available     Review of Systems  Gastrointestinal: Positive for abdominal pain, blood in stool and hematochezia.  All other systems reviewed and are negative.     Allergies  Review of patient's allergies indicates no known allergies.  Home Medications   Prior to Admission medications   Medication Sig Start Date End Date Taking? Authorizing Provider  amoxicillin-clavulanate (AUGMENTIN) 875-125 MG per tablet Take 1 tablet by mouth every 12 (twelve) hours. 10/01/14   Roxy Horseman, PA-C  bacitracin ointment Apply 1 application topically 3 (three) times daily. 10/01/14   Roxy Horseman, PA-C  chlorhexidine (PERIDEX) 0.12 % solution Use as directed 15 mLs in the mouth or throat 2 (two) times daily. 10/01/14   Roxy Horseman, PA-C  HYDROcodone-acetaminophen (NORCO/VICODIN) 5-325 MG per tablet Take 2 tablets by mouth every 4 (four) hours as needed. 10/01/14   Roxy Horseman, PA-C  metroNIDAZOLE (FLAGYL) 500 MG tablet Take 1 tablet (500 mg total) by mouth 2 (two) times daily. Patient not taking: Reported on 10/01/2014 08/15/13   Adline Potter, NP  naproxen (NAPROSYN) 500 MG tablet Take 500 mg by mouth 2 (two) times daily. 08/18/14   Historical Provider, MD   BP 124/87 mmHg  Pulse 76  Temp(Src) 98.7 F (37.1 C) (Oral)  Resp 16  Ht 5' 8.5" (1.74 m)  Wt 345 lb (  156.491 kg)  BMI 51.69 kg/m2  SpO2 99%  LMP 04/02/2015 Physical Exam  Constitutional: She is oriented to person, place, and time. She appears well-developed and well-nourished. No distress.  HENT:  Head: Normocephalic and atraumatic.  Right Ear: Hearing normal.  Left Ear: Hearing normal.  Nose: Nose normal.  Mouth/Throat: Oropharynx is clear and moist and mucous membranes are normal.  Eyes: Conjunctivae and EOM are normal. Pupils are equal, round, and reactive to light.  Neck: Normal range of motion. Neck supple.  Cardiovascular: Regular rhythm, S1 normal and S2 normal.  Exam reveals no  gallop and no friction rub.   No murmur heard. Pulmonary/Chest: Effort normal and breath sounds normal. No respiratory distress. She exhibits no tenderness.  Abdominal: Soft. Normal appearance and bowel sounds are normal. There is no hepatosplenomegaly. There is tenderness in the right upper quadrant. There is no rebound, no guarding, no tenderness at McBurney's point and negative Murphy's sign. No hernia.  Genitourinary: Rectal exam shows external hemorrhoid (non-swollen, non-thrombosed, no bleeding). Rectal exam shows no fissure. Guaiac negative stool.  Musculoskeletal: Normal range of motion.  Neurological: She is alert and oriented to person, place, and time. She has normal strength. No cranial nerve deficit or sensory deficit. Coordination normal. GCS eye subscore is 4. GCS verbal subscore is 5. GCS motor subscore is 6.  Skin: Skin is warm, dry and intact. No rash noted. No cyanosis.  Psychiatric: She has a normal mood and affect. Her speech is normal and behavior is normal. Thought content normal.  Nursing note and vitals reviewed.   ED Course  Procedures (including critical care time) Labs Review Labs Reviewed  CBC WITH DIFFERENTIAL/PLATELET  COMPREHENSIVE METABOLIC PANEL  LIPASE, BLOOD  POC OCCULT BLOOD, ED    Imaging Review No results found. I have personally reviewed and evaluated these images and lab results as part of my medical decision-making.   EKG Interpretation None      MDM   Final diagnoses:  None   rectal bleeding  Presents to the ER for evaluation of rectal bleeding. Symptoms have been ongoing for 2 days. Patient does report that she had a similar episode in the past, but it went away after one day and she did not seek medical attention. Rectal examination today was heme-negative. There is no evidence of ongoing bleeding. Hemoglobin is 11. White count is normal, she is afebrile. Abdominal exam revealed mild tenderness in the right upper quadrant. This has  been chronically present and evaluated by her doctor with ultrasound in the past. There is no lower abdominal or pelvic symptoms. She did not have tenderness in the lower abdomen. Rectal examination revealed an external hemorrhoid that is not bleeding. There were no masses. I believe the patient is appropriate for continued outpatient workup. She'll be treated with Anusol HC, follow-up with gastroenterology. Return to the ER if she has increased bleeding.    Gilda Crease, MD 04/13/15 [phone]

## 2015-04-20 ENCOUNTER — Encounter: Payer: Self-pay | Admitting: Gastroenterology

## 2015-05-11 ENCOUNTER — Ambulatory Visit: Payer: BLUE CROSS/BLUE SHIELD | Admitting: Gastroenterology

## 2015-05-11 ENCOUNTER — Telehealth: Payer: Self-pay | Admitting: Gastroenterology

## 2015-05-11 ENCOUNTER — Encounter: Payer: Self-pay | Admitting: Gastroenterology

## 2015-05-11 NOTE — Telephone Encounter (Signed)
PATIENT WAS A NO SHOW AND LETTER SENT  °

## 2015-05-30 ENCOUNTER — Ambulatory Visit: Payer: BLUE CROSS/BLUE SHIELD | Admitting: Gastroenterology

## 2015-09-10 ENCOUNTER — Ambulatory Visit: Payer: Self-pay | Admitting: Adult Health

## 2015-09-10 ENCOUNTER — Ambulatory Visit (INDEPENDENT_AMBULATORY_CARE_PROVIDER_SITE_OTHER): Payer: BLUE CROSS/BLUE SHIELD | Admitting: Obstetrics and Gynecology

## 2015-09-10 ENCOUNTER — Telehealth: Payer: Self-pay | Admitting: *Deleted

## 2015-09-10 ENCOUNTER — Encounter: Payer: Self-pay | Admitting: Obstetrics and Gynecology

## 2015-09-10 VITALS — BP 130/80 | Ht 68.0 in | Wt 327.0 lb

## 2015-09-10 DIAGNOSIS — Z30011 Encounter for initial prescription of contraceptive pills: Secondary | ICD-10-CM

## 2015-09-10 DIAGNOSIS — B9689 Other specified bacterial agents as the cause of diseases classified elsewhere: Secondary | ICD-10-CM | POA: Insufficient documentation

## 2015-09-10 DIAGNOSIS — N760 Acute vaginitis: Secondary | ICD-10-CM | POA: Insufficient documentation

## 2015-09-10 DIAGNOSIS — R102 Pelvic and perineal pain: Secondary | ICD-10-CM

## 2015-09-10 DIAGNOSIS — A5901 Trichomonal vulvovaginitis: Secondary | ICD-10-CM | POA: Insufficient documentation

## 2015-09-10 DIAGNOSIS — Z3009 Encounter for other general counseling and advice on contraception: Secondary | ICD-10-CM | POA: Diagnosis not present

## 2015-09-10 DIAGNOSIS — R300 Dysuria: Secondary | ICD-10-CM

## 2015-09-10 DIAGNOSIS — Z309 Encounter for contraceptive management, unspecified: Secondary | ICD-10-CM | POA: Insufficient documentation

## 2015-09-10 DIAGNOSIS — N939 Abnormal uterine and vaginal bleeding, unspecified: Secondary | ICD-10-CM

## 2015-09-10 DIAGNOSIS — A499 Bacterial infection, unspecified: Secondary | ICD-10-CM | POA: Diagnosis not present

## 2015-09-10 LAB — POCT URINALYSIS DIPSTICK
Blood, UA: NEGATIVE
Glucose, UA: NEGATIVE
Ketones, UA: NEGATIVE
LEUKOCYTES UA: NEGATIVE
NITRITE UA: NEGATIVE
PROTEIN UA: NEGATIVE

## 2015-09-10 MED ORDER — DOXYCYCLINE HYCLATE 100 MG PO TBEC: 100.0000 mg | DELAYED_RELEASE_TABLET | Freq: Two times a day (BID) | ORAL | Status: DC

## 2015-09-10 MED ORDER — METRONIDAZOLE 500 MG PO TABS: 500.0000 mg | ORAL_TABLET | Freq: Two times a day (BID) | ORAL | Status: AC

## 2015-09-10 MED ORDER — NORGESTIMATE-ETH ESTRADIOL 0.25-35 MG-MCG PO TABS: 1.0000 | ORAL_TABLET | Freq: Every day | ORAL | Status: DC

## 2015-09-10 NOTE — Progress Notes (Signed)
[facility] Visit  Patient name: Jacqueline Alvarado MRN [id number]  Date of birth: May 06, 1996  CC & HPI:  Jacqueline Alvarado is a 20 y.o. female presenting today for light vaginal bleeding and burning vaginal pain. She states her LMP was 08/20/15. Patient states her periods have been regular since starting BCP's about 8 years ago in order to regulate her periods, but she stopped using BCP's 4 years ago. She state she is currently sexually active. She states she is interested in starting contraception, but her mother is philosophically opposed to her using contraception. Patient states she last had a pap smear in August, which she states was "abnormal," and she was advised a 1-year follow-up.   ROS:  A complete 10 system review of systems was obtained and all systems are negative except as noted in the HPI and PMH.    Pertinent History Reviewed:   Reviewed: Significant for abnormal pap smear of cervix Medical         Past Medical History  Diagnosis Date  . PONV (postoperative nausea and vomiting)     Hx: of nausea only at age 49  . Headache(784.0)   . Low iron     Hx: of  . Abnormal Pap smear of cervix                               Surgical Hx:    Past Surgical History  Procedure Laterality Date  . Tumor excision      Hx: of right side of neck  . Tonsillectomy and adenoidectomy Bilateral 06/24/2013    Procedure: TONSILLECTOMY ;  Surgeon: Flo Shanks, MD;  Location: [facility] OR;  Service: ENT;  Laterality: Bilateral;  . Nasal septoplasty w/ turbinoplasty Bilateral 06/24/2013    Procedure: TURBINATE REDUCTION;  Surgeon: Flo Shanks, MD;  Location: [facility] OR;  Service: ENT;  Laterality: Bilateral;   Medications: Reviewed & Updated - see associated section                       Current outpatient prescriptions:  .  hydrocortisone (ANUSOL-HC) 2.5 % rectal cream, Place 1 application rectally as needed for hemorrhoids or itching., Disp: , Rfl:    Social History: Reviewed -  reports that she  has been smoking Cigarettes.  She has never used smokeless tobacco.  Objective Findings:  Vitals: Blood pressure 130/80, height  (1.727 m), weight 327 lb (148.326 kg), last menstrual period 08/20/2015.  Physical Examination: General appearance - alert, well appearing, and in no distress, oriented to person, place, and time and morbidly obese Mental status - alert, oriented to person, place, and time, normal mood, behavior, speech, dress, motor activity, and thought processes, affect appropriate to mood Pelvic - VULVA: normal appearing vulva with 2cm x 2cm x 2cm very hard area that is mildly tender   VAGINA: normal appearing vagina with thick, mucoid discharge with malodor  Wet Prep: Positive for trichomonas and BV   Assessment & Plan:   A:  1. Contr management 2. STd trich vaginitis 3. BV 4 std screen     P:  1. Rx sprintec 2. Rx metronidazole bid x 7d refil x 1 for partner 2. F/u in 2 weeks for POC    By signing my name below, I, Ronney Lion, attest that this documentation has been prepared under the direction and in the presence of Tilda Burrow, MD. Electronically Signed:  Ronney Lion, ED Scribe. 09/10/2015. 12:14 PM.  I personally performed the services described in this documentation, which was SCRIBED in my presence. The recorded information has been reviewed and considered accurate. It has been edited as necessary during review. Tilda Burrow, MD

## 2015-09-10 NOTE — Telephone Encounter (Signed)
Spoke with Dr. Emelda Fear and he gave a verbal order for Doxycycline  BID for 10 days with 3 refills.  Pt aware that Rx was sent to her pharmacy.

## 2015-09-10 NOTE — Telephone Encounter (Signed)
Pt states Dr. Emelda Fear told her she had an infection on her thigh and he was going to prescribed an antibiotic but was not at the pharmacy. Please advise.

## 2015-09-10 NOTE — Progress Notes (Signed)
Patient ID: Jacqueline Alvarado, female   DOB: [birth year], 20 y.o.   MRN: [id number] Pt here today for bleeding and vaginal pain. Pt states that she has had the pain for about a week. Pt states that pain is worse at night. Pt states that nothing really helps the pain. Pt states that she has had some light pink spotting for the last week. Pt denies any cramping and states that it burns some when she voids.

## 2015-09-11 LAB — GC/CHLAMYDIA PROBE AMP
Chlamydia trachomatis, NAA: NEGATIVE
NEISSERIA GONORRHOEAE BY PCR: NEGATIVE

## 2015-09-24 ENCOUNTER — Ambulatory Visit: Payer: BLUE CROSS/BLUE SHIELD | Admitting: Obstetrics and Gynecology

## 2015-09-24 ENCOUNTER — Encounter: Payer: Self-pay | Admitting: Obstetrics and Gynecology

## 2015-12-20 ENCOUNTER — Other Ambulatory Visit: Payer: Self-pay

## 2015-12-20 ENCOUNTER — Encounter: Payer: Self-pay | Admitting: Nurse Practitioner

## 2015-12-20 ENCOUNTER — Ambulatory Visit (INDEPENDENT_AMBULATORY_CARE_PROVIDER_SITE_OTHER): Payer: BLUE CROSS/BLUE SHIELD | Admitting: Nurse Practitioner

## 2015-12-20 VITALS — BP 136/76 | HR 59 | Temp 98.7°F | Ht 68.0 in | Wt 321.8 lb

## 2015-12-20 DIAGNOSIS — K649 Unspecified hemorrhoids: Secondary | ICD-10-CM | POA: Diagnosis not present

## 2015-12-20 DIAGNOSIS — R1011 Right upper quadrant pain: Secondary | ICD-10-CM | POA: Diagnosis not present

## 2015-12-20 DIAGNOSIS — K625 Hemorrhage of anus and rectum: Secondary | ICD-10-CM | POA: Diagnosis not present

## 2015-12-20 MED ORDER — PEG 3350-KCL-NA BICARB-NACL 420 G PO SOLR: 4000.0000 mL | ORAL | Status: DC

## 2015-12-20 NOTE — Progress Notes (Signed)
Primary Care Physician:  Milana Obey, MD Primary Gastroenterologist:  Dr. Darrick Penna  Chief Complaint  Patient presents with  . Rectal Bleeding  . Abdominal Pain    HPI:   Jacqueline Alvarado is a 20 y.o. female who presents on referral from he ED for evaluation of rectal bleeding. She was in the ER on 04/13/15. At that time she complained of 2 day history of rectal bleeding of at least 7 episodes. Had rectal pain. Reports history of hemorrhoids but no swelling or pain on rectal exam was noted. Chronic right upper quadrant pain which is being evaluated by primary care, had an ultrasound which was negative. Rectal exam with heme test in stool negative. Hemoglobin was 11. White count normal, afebrile. She was discharged home for further outpatient evaluation. She is given a prescription for Anusol suppositories. Follow-up appointment scheduled for 05/11/2015 the patient was a no-show.  Today she states she has had more rectal bleeding, about once a month or more, brbpr, on/in stool and on the toilet tissue. Sometimes had hemorrhoid symptoms with bleeding episodes, other times not. Still with RUQ abdominal pain. U/S has been done which showed no gallstones or CBD dilation, liver normal parenchymal echogenicity. RUQ pain is intermittent, sharp vs dull at other times. Worse with her period, sometimes worse after eating. Denies other abdominal pain, vomiting. Has persistent low-level nausea. Did have 1-2 episodes of emesis with abdominal pain last week. Has regular bowel movements but varies from 4 to 7. Worsening loose stools typically during her period. Has had more migraines, has not been evaluated by PCP yet. Denies chest pain, dyspnea, dizziness, lightheadedness, syncope, near syncope. Denies any other upper or lower GI symptoms.  Past Medical History  Diagnosis Date  . PONV (postoperative nausea and vomiting)     Hx: of nausea only at age 56  . Headache(784.0)   . Low iron     Hx: of  .  Abnormal Pap smear of cervix     Past Surgical History  Procedure Laterality Date  . Tumor excision      Hx: of right side of neck  . Tonsillectomy and adenoidectomy Bilateral 06/24/2013    Procedure: TONSILLECTOMY ;  Surgeon: Flo Shanks, MD;  Location: [facility] OR;  Service: ENT;  Laterality: Bilateral;  . Nasal septoplasty w/ turbinoplasty Bilateral 06/24/2013    Procedure: TURBINATE REDUCTION;  Surgeon: Flo Shanks, MD;  Location: [facility] OR;  Service: ENT;  Laterality: Bilateral;    Current Outpatient Prescriptions  Medication Sig Dispense Refill  . hydrocortisone (ANUSOL-HC) 2.5 % rectal cream Place 1 application rectally as needed for hemorrhoids or itching. Reported on 12/20/2015    . norgestimate-ethinyl estradiol (ORTHO-CYCLEN,SPRINTEC,PREVIFEM) 0.25-35 MG-MCG tablet Take 1 tablet by mouth daily. 1 Package 11  . polyethylene glycol-electrolytes (TRILYTE) 420 g solution Take 4,000 mLs by mouth as directed. 4000 mL 0   No current facility-administered medications for this visit.    Allergies as of 12/20/2015  . (No Known Allergies)    Family History  Problem Relation Age of Onset  . Cancer - Other Mother   . Arthritis Mother   . Hypertension Father   . COPD Father   . Cancer - Cervical Other   . Diabetes Other   . Stroke Other     Social History   Social History  . Marital Status: Single    Spouse Name: N/A  . Number of Children: N/A  . Years of Education: N/A   Occupational History  .  Not on file.   Social History Main Topics  . Smoking status: Former Smoker    Types: Cigarettes  . Smokeless tobacco: Never Used     Comment: pt smokes socially.  . Alcohol Use: 0.0 oz/week    0 Standard drinks or equivalent per week     Comment: occasional  . Drug Use: No  . Sexual Activity: Yes    Birth Control/ Protection: None, Condom   Other Topics Concern  . Not on file   Social History Narrative    Review of Systems: 10-point ROS negative except as per  HPI.    Physical Exam: BP 136/76 mmHg  Pulse 59  Temp(Src) 98.7 F (37.1 C)  Ht 5\' 8"  (1.727 m)  Wt 321 lb 12.8 oz (145.968 kg)  BMI 48.94 kg/m2  LMP 12/12/2015 General:   Alert and oriented. Pleasant and cooperative. Well-nourished and well-developed.  Head:  Normocephalic and atraumatic. Eyes:  Without icterus, sclera clear and conjunctiva pink.  Ears:  Normal auditory acuity. Cardiovascular:  S1, S2 present without murmurs appreciated. Extremities without clubbing or edema. Respiratory:  Clear to auscultation bilaterally. No wheezes, rales, or rhonchi. No distress.  Gastrointestinal:  +BS, soft, and non-distended. RUQ TTP. No HSM noted. No guarding or rebound. No masses appreciated.  Rectal:  Deferred  Musculoskalatal:  Symmetrical without gross deformities. Neurologic:  Alert and oriented x4;  grossly normal neurologically. Psych:  Alert and cooperative. Normal mood and affect. Heme/Lymph/Immune: No excessive bruising noted.    12/20/2015 3:24 PM   Disclaimer: This note was dictated with voice recognition software. Similar sounding words can inadvertently be transcribed and may not be corrected upon review.

## 2015-12-20 NOTE — Patient Instructions (Signed)
1. We will schedule your HIDA scan for you. 2. We will schedule your procedure for you. 3. Continue to use Anusol rectal cream as needed. 4. Return follow-up in 3 months.

## 2015-12-20 NOTE — Assessment & Plan Note (Signed)
Right upper quadrant pain continues. Is intermittent, usually worse after eating. RUQ U/S without gallstones or CBD dilation. At this point to wrap up gallbladder workup we will send her for a HIDA scan for functional GB disease. Return for follow-up in 3 months.

## 2015-12-20 NOTE — Assessment & Plan Note (Signed)
Patient with a history of hemorrhoids which is likely the explanation for her rectal bleeding. However, colonoscopy as noted below to further evaluate. She is interested hemorrhoid banding if appropriate. We will do her colonoscopy with a possible banding. If her hemorrhoids are not amenable to banding, we can discuss further options including topical medications versus surgical resection. Return for follow-up in 3 months.

## 2015-12-20 NOTE — Assessment & Plan Note (Signed)
Patient with intermittent yet persistent rectal bleeding. She has hemorrhoids and sometimes when she has bleeding she notes her hemorrhoids if flared, other times her hemorrhoids are not flaring and she still has bleeding. She uses Anusol for hemorrhoid flares which helped the hemorrhoid irritation/pain. At this point given her abdominal pain, rectal bleeding we'll proceed with a colonoscopy to further evaluate. Likely benign anorectal source, but cannot rule out polyp, cancer, or other more insidious pathology. Less likely IBD.  Proceed with colonoscopy +/- banding with Dr. Darrick PennaFields in the near future. The risks, benefits, and alternatives have been discussed in detail with the patient. They state understanding and desire to proceed.   Patient is not on any anticoagulants, anxiolytics, chronic pain medications, or antidepressants. Conscious sedation should be adequate for her procedure.

## 2015-12-21 ENCOUNTER — Encounter (HOSPITAL_COMMUNITY)
Admission: RE | Admit: 2015-12-21 | Discharge: 2015-12-21 | Disposition: A | Discharge status: Home / Self Care | Payer: BLUE CROSS/BLUE SHIELD | Source: Ambulatory Visit | Attending: Nurse Practitioner | Admitting: Nurse Practitioner

## 2015-12-21 DIAGNOSIS — K625 Hemorrhage of anus and rectum: Secondary | ICD-10-CM | POA: Insufficient documentation

## 2015-12-21 DIAGNOSIS — K649 Unspecified hemorrhoids: Secondary | ICD-10-CM | POA: Insufficient documentation

## 2015-12-21 DIAGNOSIS — R1011 Right upper quadrant pain: Secondary | ICD-10-CM | POA: Diagnosis present

## 2015-12-21 LAB — HCG, SERUM, QUALITATIVE: Preg, Serum: NEGATIVE

## 2015-12-21 MED ORDER — TECHNETIUM TC 99M MEBROFENIN IV KIT
5.0000 | PACK | Freq: Once | INTRAVENOUS | Status: AC | PRN
  Administered 2015-12-21: 5 via INTRAVENOUS

## 2015-12-21 NOTE — Progress Notes (Signed)
cc'ed to pcp °

## 2016-01-07 ENCOUNTER — Ambulatory Visit (HOSPITAL_COMMUNITY)
Admission: RE | Admit: 2016-01-07 | Discharge: 2016-01-07 | Disposition: A | Discharge status: Home / Self Care | Payer: BLUE CROSS/BLUE SHIELD | Source: Ambulatory Visit | Attending: Gastroenterology | Admitting: Gastroenterology

## 2016-01-07 ENCOUNTER — Encounter (HOSPITAL_COMMUNITY): Payer: Self-pay | Admitting: *Deleted

## 2016-01-07 ENCOUNTER — Encounter (HOSPITAL_COMMUNITY)
Admission: RE | Disposition: A | Discharge status: Home / Self Care | Payer: Self-pay | Source: Ambulatory Visit | Attending: Gastroenterology

## 2016-01-07 DIAGNOSIS — K529 Noninfective gastroenteritis and colitis, unspecified: Secondary | ICD-10-CM | POA: Diagnosis not present

## 2016-01-07 DIAGNOSIS — K633 Ulcer of intestine: Secondary | ICD-10-CM

## 2016-01-07 DIAGNOSIS — K649 Unspecified hemorrhoids: Secondary | ICD-10-CM | POA: Diagnosis not present

## 2016-01-07 DIAGNOSIS — K625 Hemorrhage of anus and rectum: Secondary | ICD-10-CM

## 2016-01-07 DIAGNOSIS — K921 Melena: Secondary | ICD-10-CM

## 2016-01-07 DIAGNOSIS — K648 Other hemorrhoids: Secondary | ICD-10-CM | POA: Insufficient documentation

## 2016-01-07 DIAGNOSIS — Z809 Family history of malignant neoplasm, unspecified: Secondary | ICD-10-CM | POA: Diagnosis not present

## 2016-01-07 HISTORY — PX: HEMORRHOID BANDING: SHX5850

## 2016-01-07 HISTORY — PX: COLONOSCOPY: SHX5424

## 2016-01-07 SURGERY — COLONOSCOPY
Anesthesia: Moderate Sedation

## 2016-01-07 MED ORDER — MEPERIDINE HCL 100 MG/ML IJ SOLN
INTRAMUSCULAR | Status: DC | PRN
  Administered 2016-01-07: 50 mg via INTRAVENOUS
  Administered 2016-01-07 (×2): 25 mg via INTRAVENOUS

## 2016-01-07 MED ORDER — PROMETHAZINE HCL 25 MG/ML IJ SOLN
INTRAMUSCULAR | Status: DC | PRN
Start: 2016-01-07 — End: 2016-01-07
  Administered 2016-01-07: 12.5 mg via INTRAVENOUS

## 2016-01-07 MED ORDER — MIDAZOLAM HCL 5 MG/5ML IJ SOLN
INTRAMUSCULAR | Status: AC
  Filled 2016-01-07: qty 10

## 2016-01-07 MED ORDER — SIMETHICONE 40 MG/0.6ML PO SUSP
ORAL | Status: DC | PRN
  Administered 2016-01-07: 14:00:00

## 2016-01-07 MED ORDER — MEPERIDINE HCL 100 MG/ML IJ SOLN
INTRAMUSCULAR | Status: AC
  Filled 2016-01-07: qty 2

## 2016-01-07 MED ORDER — MIDAZOLAM HCL 5 MG/5ML IJ SOLN
INTRAMUSCULAR | Status: DC | PRN
  Administered 2016-01-07 (×3): 2 mg via INTRAVENOUS

## 2016-01-07 MED ORDER — SODIUM CHLORIDE 0.9 % IV SOLN
INTRAVENOUS | Status: DC
  Administered 2016-01-07: 13:00:00 via INTRAVENOUS

## 2016-01-07 MED ORDER — PROMETHAZINE HCL 25 MG/ML IJ SOLN
INTRAMUSCULAR | Status: AC
  Filled 2016-01-07: qty 1

## 2016-01-07 NOTE — Discharge Instructions (Signed)
YOU HAVE ULCERS IN YOUR SMALL BOWEL DUE TO ibuprofenUSE. You have internal hemorrhoids. YOU DID NOT HAVE ANY POLYPS.   I PLACED three bands to treat your hemorrhoids.  DRINK WATER TO KEEP YOUR URINE LIGHT YELLOW.  FOLLOW A HIGH FIBER DIET. AVOID ITEMS THAT CAUSE BLOATING. SEE INFO BELOW.  YOUR BIOPSY RESULTS WILL BE AVAILABLE IN MY CHART JUN 28 AND MY OFFICE WILL CONTACT YOU IN 14 DAYS WITH YOUR RESULTS.   FOLLOW UP IN 3 MOS.   Colonoscopy Care After Read the instructions outlined below and refer to this sheet in the next week. These discharge instructions provide you with general information on caring for yourself after you leave the hospital. While your treatment has been planned according to the most current medical practices available, unavoidable complications occasionally occur. If you have any problems or questions after discharge, call DR. Delilah Mulgrew, [phone].  ACTIVITY  You may resume your regular activity, but move at a slower pace for the next 24 hours.   Take frequent rest periods for the next 24 hours.   Walking will help get rid of the air and reduce the bloated feeling in your belly (abdomen).   No driving for 24 hours (because of the medicine (anesthesia) used during the test).   You may shower.   Do not sign any important legal documents or operate any machinery for 24 hours (because of the anesthesia used during the test).    NUTRITION  Drink plenty of fluids.   You may resume your normal diet as instructed by your doctor.   Begin with a light meal and progress to your normal diet. Heavy or fried foods are harder to digest and may make you feel sick to your stomach (nauseated).   Avoid alcoholic beverages for 24 hours or as instructed.    MEDICATIONS  You may resume your normal medications.   WHAT YOU CAN EXPECT TODAY  Some feelings of bloating in the abdomen.   Passage of more gas than usual.   Spotting of blood in your stool or on the toilet  paper  .  IF YOU HAD POLYPS REMOVED DURING THE COLONOSCOPY:  Eat a soft diet IF YOU HAVE NAUSEA, BLOATING, ABDOMINAL PAIN, OR VOMITING.    FINDING OUT THE RESULTS OF YOUR TEST Not all test results are available during your visit. DR. Darrick PennaFIELDS WILL CALL YOU WITHIN 7 DAYS OF YOUR PROCEDUE WITH YOUR RESULTS. Do not assume everything is normal if you have not heard from DR. Kathy Wahid IN ONE WEEK, CALL HER OFFICE AT [phone].  SEEK IMMEDIATE MEDICAL ATTENTION AND CALL THE OFFICE: [phone] IF:  You have more than a spotting of blood in your stool.   Your belly is swollen (abdominal distention).   You are nauseated or vomiting.   You have a temperature over 101F.   You have abdominal pain or discomfort that is severe or gets worse throughout the day.  High-Fiber Diet A high-fiber diet changes your normal diet to include more whole grains, legumes, fruits, and vegetables. Changes in the diet involve replacing refined carbohydrates with unrefined foods. The calorie level of the diet is essentially unchanged. The Dietary Reference Intake (recommended amount) for adult males is 38 grams per day. For adult females, it is 25 grams per day. Pregnant and lactating women should consume 28 grams of fiber per day. Fiber is the intact part of a plant that is not broken down during digestion. Functional fiber is fiber that has been isolated from the  plant to provide a beneficial effect in the body. PURPOSE  Increase stool bulk.   Ease and regulate bowel movements.   Lower cholesterol.  REDUCE RISK OF COLON CANCER  INDICATIONS THAT YOU NEED MORE FIBER  Constipation and hemorrhoids.   Uncomplicated diverticulosis (intestine condition) and irritable bowel syndrome.   Weight management.   As a protective measure against hardening of the arteries (atherosclerosis), diabetes, and cancer.   GUIDELINES FOR INCREASING FIBER IN THE DIET  Start adding fiber to the diet slowly. A gradual increase  of about 5 more grams (2 slices of whole-wheat bread, 2 servings of most fruits or vegetables, or 1 bowl of high-fiber cereal) per day is best. Too rapid an increase in fiber may result in constipation, flatulence, and bloating.   Drink enough water and fluids to keep your urine clear or pale yellow. Water, juice, or caffeine-free drinks are recommended. Not drinking enough fluid may cause constipation.   Eat a variety of high-fiber foods rather than one type of fiber.   Try to increase your intake of fiber through using high-fiber foods rather than fiber pills or supplements that contain small amounts of fiber.   The goal is to change the types of food eaten. Do not supplement your present diet with high-fiber foods, but replace foods in your present diet.   INCLUDE A VARIETY OF FIBER SOURCES  Replace refined and processed grains with whole grains, canned fruits with fresh fruits, and incorporate other fiber sources. White rice, white breads, and most bakery goods contain little or no fiber.   Brown whole-grain rice, buckwheat oats, and many fruits and vegetables are all good sources of fiber. These include: broccoli, Brussels sprouts, cabbage, cauliflower, beets, sweet potatoes, white potatoes (skin on), carrots, tomatoes, eggplant, squash, berries, fresh fruits, and dried fruits.   Cereals appear to be the richest source of fiber. Cereal fiber is found in whole grains and bran. Bran is the fiber-rich outer coat of cereal grain, which is largely removed in refining. In whole-grain cereals, the bran remains. In breakfast cereals, the largest amount of fiber is found in those with "bran" in their names. The fiber content is sometimes indicated on the label.   You may need to include additional fruits and vegetables each day.   In baking, for 1 cup white flour, you may use the following substitutions:   1 cup whole-wheat flour minus 2 tablespoons.   1/2 cup white flour plus 1/2 cup whole-wheat  flour.   Hemorrhoids Hemorrhoids are dilated (enlarged) veins around the rectum. Sometimes clots will form in the veins. This makes them swollen and painful. These are called thrombosed hemorrhoids. Causes of hemorrhoids include:  Constipation.   Straining to have a bowel movement.   HEAVY LIFTING  HOME CARE INSTRUCTIONS  Eat a well balanced diet and drink 6 to 8 glasses of water every day to avoid constipation. You may also use a bulk laxative.   Avoid straining to have bowel movements.   Keep anal area dry and clean.   Do not use a donut shaped pillow or sit on the toilet for long periods. This increases blood pooling and pain.   Move your bowels when your body has the urge; this will require less straining and will decrease pain and pressure.    HEMORRHOIDAL BANDING COMPLICATIONS:  COMMON: 1. MINOR PAIN  UNCOMMON: 1. ABSCESS 2. BAND FALLS OFF 3. PROLAPSE OF HEMORRHOIDS AND PAIN 4. ULCER BLEEDING  A. USUALLY SELF-LIMITED: MAY LAST 3-5 DAYS  B. MAY REQUIRE INTERVENTION: 1-2 WEEKS AFTER INTERACTIONS 5. NECROTIZING PELVIC SEPSIS  A. SYMPTOMS: FEVER, PAIN, DIFFICULTY URINATING

## 2016-01-07 NOTE — H&P (Signed)
  Primary Care Physician:  Milana ObeyStephen D Knowlton, MD Primary Gastroenterologist:  Dr. Darrick PennaFields  Pre-Procedure History & Physical: HPI:  Jacqueline Alvarado is a 20 y.o. female here for  RECTAL BLEEDING/pain.  Past Medical History  Diagnosis Date  . PONV (postoperative nausea and vomiting)     Hx: of nausea only at age 20  . Headache(784.0)   . Low iron     Hx: of  . Abnormal Pap smear of cervix     Past Surgical History  Procedure Laterality Date  . Tumor excision      Hx: of right side of neck  . Tonsillectomy and adenoidectomy Bilateral 06/24/2013    Procedure: TONSILLECTOMY ;  Surgeon: Flo ShanksKarol Wolicki, MD;  [facility];  Service: ENT;  Laterality: Bilateral;  . Nasal septoplasty w/ turbinoplasty Bilateral 06/24/2013    Procedure: TURBINATE REDUCTION;  Surgeon: Flo ShanksKarol Wolicki, MD;  [facility];  Service: ENT;  Laterality: Bilateral;    Prior to Admission medications   Medication Sig Start Date End Date Taking? Authorizing Provider  norgestimate-ethinyl estradiol (ORTHO-CYCLEN,SPRINTEC,PREVIFEM) 0.25-35 MG-MCG tablet Take 1 tablet by mouth daily. 09/10/15  Yes Tilda BurrowJohn V Ferguson, MD  polyethylene glycol-electrolytes (TRILYTE) 420 g solution Take 4,000 mLs by mouth as directed. 12/20/15  Yes West BaliSandi L Meron Bocchino, MD  hydrocortisone (ANUSOL-HC) 2.5 % rectal cream Place 1 application rectally as needed for hemorrhoids or itching. Reported on 12/20/2015    Historical Provider, MD    Allergies as of 12/20/2015  . (No Known Allergies)    Family History  Problem Relation Age of Onset  . Cancer - Other Mother   . Arthritis Mother   . Hypertension Father   . COPD Father   . Cancer - Cervical Other   . Diabetes Other   . Stroke Other     Social History   Social History  . Marital Status: Single    Spouse Name: N/A  . Number of Children: N/A  . Years of Education: N/A   Occupational History  . Not on file.   Social History Main Topics  . Smoking status: Light Tobacco Smoker    Types:  Cigarettes  . Smokeless tobacco: Never Used     Comment: pt smokes socially.  . Alcohol Use: 0.0 oz/week    0 Standard drinks or equivalent per week     Comment: occasional  . Drug Use: Yes    Special: Marijuana  . Sexual Activity: Yes    Birth Control/ Protection: None, Condom   Other Topics Concern  . Not on file   Social History Narrative    Review of Systems: See HPI, otherwise negative ROS   Physical Exam: BP 145/100 mmHg  Pulse 60  Temp(Src) 98.4 F (36.9 C) (Oral)  Resp 15  Ht 5\' 8"  (1.727 m)  Wt 321 lb (145.605 kg)  BMI 48.82 kg/m2  SpO2 100%  LMP 12/06/2015 General:   Alert,  pleasant and cooperative in NAD Head:  Normocephalic and atraumatic. Neck:  Supple; Lungs:  Clear throughout to auscultation.    Heart:  Regular rate and rhythm. Abdomen:  Soft, nontender and nondistended. Normal bowel sounds, without guarding, and without rebound.   Neurologic:  Alert and  oriented x4;  grossly normal neurologically.  Impression/Plan:     RECTAL BLEEDING  PLAN:  1. TCS/? Hemorrhoid banding TODAY

## 2016-01-07 NOTE — Op Note (Signed)
[facility] Patient Name: Jacqueline Alvarado Procedure Date: 01/07/2016 1:47 PM MRN: [id number] Date of Birth: 19-Apr-1996 Attending MD: Jonette Eva , MD CSN: [id number] Age: 20 Admit Type: Outpatient Procedure:                Colonoscopy WITH HEMORRHOID BANDING AND ILEAL COLD                            BIOPSY Indications:              Hematochezia Providers:                Jonette Eva, MD, Edrick Kins, RN, Calton Dach,                            Technician Referring MD:             Katharine Look, MD Medicines:                Promethazine 12.5 mg IV, Meperidine 100 mg IV,                            Midazolam 6 mg IV Complications:            No immediate complications. Estimated Blood Loss:     Estimated blood loss was minimal. Procedure:                Pre-Anesthesia Assessment:                           - Prior to the procedure, a History and Physical                            was performed, and patient medications and                            allergies were reviewed. The patient's tolerance of                            previous anesthesia was also reviewed. The risks                            and benefits of the procedure and the sedation                            options and risks were discussed with the patient.                            All questions were answered, and informed consent                            was obtained. Prior Anticoagulants: The patient has                            taken no previous anticoagulant or antiplatelet  agents. ASA Grade Assessment: II - A patient with                            mild systemic disease. After reviewing the risks                            and benefits, the patient was deemed in                            satisfactory condition to undergo the procedure.                           After obtaining informed consent, the colonoscope                            was passed under direct vision.  Throughout the                            procedure, the patient's blood pressure, pulse, and                            oxygen saturations were monitored continuously. The                            Colonoscope was introduced through the anus and                            advanced to the 10 cm into the ileum. The                            colonoscopy was technically difficult and complex                            due to significant looping. Successful completion                            of the procedure was aided by COLOWRAP. The                            terminal ileum, ileocecal valve, appendiceal                            orifice, and rectum were photographed. The quality                            of the bowel preparation was excellent. Scope In: 2:08:47 PM Scope Out: 2:30:10 PM Scope Withdrawal Time: 0 hours 18 minutes 57 seconds  Total Procedure Duration: 0 hours 21 minutes 23 seconds  Findings:      Non-bleeding internal hemorrhoids were found. The hemorrhoids were       large. Three bands were successfully placed. A slow ooze remained at the       end of the procedure. Estimated blood loss was minimal.      The distal ileum contained multiple ulcers.  No bleeding was present. No       stigmata of recent bleeding were seen. Biopsies were taken with a cold       forceps for histology.      The exam was otherwise without abnormality. Impression:               - RECTAL BLEEDING DUE TO due to Non-bleeding                            internal hemorrhoids.                           - Multiple ulcers in the distal ileum. Biopsied.                           - The COLON examination was otherwise normal. Moderate Sedation:      Moderate (conscious) sedation was administered by the endoscopy nurse       and supervised by the endoscopist. The following parameters were       monitored: oxygen saturation, heart rate, blood pressure, and response       to care. Total physician  intraservice time was 42 minutes. Recommendation:           - High fiber diet.                           - Continue present medications.                           - Await pathology results.                           - Return to my office in 4 months.                           DRINK WATER TO KEEP YOUR URINE LIGHT YELLOW.                           FOLLOW A HIGH FIBER DIET. AVOID ITEMS THAT CAUSE                            BLOATING.                           - Patient has a contact number available for                            emergencies. The signs and symptoms of potential                            delayed complications were discussed with the                            patient. Return to normal activities tomorrow.                            Written discharge instructions were provided to the  patient.                           - No repeat colonoscopy due to age. Procedure Code(s):        --- Professional ---                           [id number], Colonoscopy, flexible; with band ligation(s)                            (eg, hemorrhoids)                           45380, Colonoscopy, flexible; with biopsy, single                            or multiple                           99152, Moderate sedation services provided by the                            same physician or other qualified health care                            professional performing the diagnostic or                            therapeutic service that the sedation supports,                            requiring the presence of an independent trained                            observer to assist in the monitoring of the                            patient's level of consciousness and physiological                            status; initial 15 minutes of intraservice time,                            patient age 80 years or older                           [phone], Moderate sedation services; each additional                             15 minutes intraservice time                           99153, Moderate sedation services; each additional                            15 minutes intraservice time Diagnosis Code(s):        ---  Professional ---                           K64.8, Other hemorrhoids                           K63.3, Ulcer of intestine                           K92.1, Melena (includes Hematochezia) CPT copyright 2016 [employer]. All rights reserved. The codes documented in this report are preliminary and upon coder review may  be revised to meet current compliance requirements. Jonette Eva, MD Jonette Eva, MD 01/07/2016 9:39:50 PM This report has been signed electronically. Number of Addenda: 0

## 2016-01-07 NOTE — Progress Notes (Signed)
REVIEWED-NO ADDITIONAL RECOMMENDATIONS. 

## 2016-01-09 NOTE — Progress Notes (Signed)
Postoperative phone call to patient.  She "has to sit on toilet a few minutes before her urine starts to flow". Denies pain , fever.  She will drink plenty of fluids.  If painful urination, cannot pass urine, or fever, she will call MD.

## 2016-01-11 ENCOUNTER — Encounter (HOSPITAL_COMMUNITY): Payer: Self-pay | Admitting: Gastroenterology

## 2016-01-29 ENCOUNTER — Telehealth: Payer: Self-pay | Admitting: Gastroenterology

## 2016-01-29 NOTE — Telephone Encounter (Signed)
PLEASE CALL PT. THE ULCERS IN HER SMALL BOWEL IS DUE TO ibuprofen USE.    SHE SHOULD AVOID IBUPROFEN AND NAPROXEN. SHE MAY ASK HER PCP FOR A MED IN A DIFFERENT CLASS.  DRINK WATER TO KEEP YOUR URINE LIGHT YELLOW.  FOLLOW A HIGH FIBER DIET. AVOID ITEMS THAT CAUSE BLOATING.   FOLLOW UP IN 3 MOS E30 NSAID ILEITIS/RECTAL BLEEDING.

## 2016-01-30 NOTE — Telephone Encounter (Signed)
PATIENT SCHEDULED FOR FOLLOW UP 

## 2016-01-30 NOTE — Telephone Encounter (Signed)
Pt is aware of results. 

## 2016-02-19 ENCOUNTER — Telehealth: Payer: Self-pay | Admitting: Obstetrics and Gynecology

## 2016-02-19 MED ORDER — NORGESTIMATE-ETH ESTRADIOL 0.25-35 MG-MCG PO TABS: 1.0000 | ORAL_TABLET | Freq: Every day | ORAL | 11 refills | Status: DC

## 2016-02-19 NOTE — Telephone Encounter (Signed)
Pt aware that Rx has been sent to her pharmacy [facility] in [place].

## 2016-03-26 ENCOUNTER — Ambulatory Visit: Payer: BLUE CROSS/BLUE SHIELD | Admitting: Nurse Practitioner

## 2016-10-21 ENCOUNTER — Encounter (HOSPITAL_COMMUNITY): Payer: Self-pay | Admitting: Cardiology

## 2016-10-21 ENCOUNTER — Emergency Department (HOSPITAL_COMMUNITY)
Admission: EM | Admit: 2016-10-21 | Discharge: 2016-10-22 | Disposition: A | Discharge status: Home / Self Care | Payer: BLUE CROSS/BLUE SHIELD | Attending: Emergency Medicine | Admitting: Emergency Medicine

## 2016-10-21 DIAGNOSIS — F17210 Nicotine dependence, cigarettes, uncomplicated: Secondary | ICD-10-CM | POA: Diagnosis not present

## 2016-10-21 DIAGNOSIS — E860 Dehydration: Secondary | ICD-10-CM | POA: Insufficient documentation

## 2016-10-21 DIAGNOSIS — Z79899 Other long term (current) drug therapy: Secondary | ICD-10-CM | POA: Insufficient documentation

## 2016-10-21 DIAGNOSIS — L03311 Cellulitis of abdominal wall: Secondary | ICD-10-CM

## 2016-10-21 DIAGNOSIS — R197 Diarrhea, unspecified: Secondary | ICD-10-CM

## 2016-10-21 DIAGNOSIS — R112 Nausea with vomiting, unspecified: Secondary | ICD-10-CM | POA: Diagnosis present

## 2016-10-21 LAB — COMPREHENSIVE METABOLIC PANEL
ALT: 14 U/L (ref 14–54)
AST: 20 U/L (ref 15–41)
Albumin: 3.7 g/dL (ref 3.5–5.0)
Alkaline Phosphatase: 28 U/L — ABNORMAL LOW (ref 38–126)
Anion gap: 9 (ref 5–15)
BUN: 7 mg/dL (ref 6–20)
CHLORIDE: 101 mmol/L (ref 101–111)
CO2: 24 mmol/L (ref 22–32)
Calcium: 9 mg/dL (ref 8.9–10.3)
Creatinine, Ser: 0.76 mg/dL (ref 0.44–1.00)
Glucose, Bld: 89 mg/dL (ref 65–99)
POTASSIUM: 3.3 mmol/L — AB (ref 3.5–5.1)
Sodium: 134 mmol/L — ABNORMAL LOW (ref 135–145)
TOTAL PROTEIN: 7.8 g/dL (ref 6.5–8.1)
Total Bilirubin: 0.7 mg/dL (ref 0.3–1.2)

## 2016-10-21 LAB — URINALYSIS, ROUTINE W REFLEX MICROSCOPIC
BILIRUBIN URINE: NEGATIVE
Bacteria, UA: NONE SEEN
Glucose, UA: NEGATIVE mg/dL
HGB URINE DIPSTICK: NEGATIVE
KETONES UR: 5 mg/dL — AB
NITRITE: NEGATIVE
PH: 7 (ref 5.0–8.0)
Protein, ur: NEGATIVE mg/dL
Specific Gravity, Urine: 1.018 (ref 1.005–1.030)

## 2016-10-21 LAB — CBC
HEMATOCRIT: 38.2 % (ref 36.0–46.0)
Hemoglobin: 12.3 g/dL (ref 12.0–15.0)
MCH: 22.8 pg — ABNORMAL LOW (ref 26.0–34.0)
MCHC: 32.2 g/dL (ref 30.0–36.0)
MCV: 70.7 fL — AB (ref 78.0–100.0)
PLATELETS: 369 10*3/uL (ref 150–400)
RBC: 5.4 MIL/uL — AB (ref 3.87–5.11)
RDW: 14.9 % (ref 11.5–15.5)
WBC: 21.8 10*3/uL — AB (ref 4.0–10.5)

## 2016-10-21 LAB — LIPASE, BLOOD: LIPASE: 16 U/L (ref 11–51)

## 2016-10-21 NOTE — ED Triage Notes (Signed)
Abscess to epigastric area since Friday.  Area drained since Sunday.  Now has epigastric pain.  Vomiting and fever today.

## 2016-10-21 NOTE — ED Provider Notes (Signed)
[facility] Provider Note   CSN: [id number] Arrival date & time: 10/21/16  1730    By signing my name below, I, Valentino Saxon, attest that this documentation has been prepared under the direction and in the presence of Devoria Albe, MD. Electronically Signed: Valentino Saxon, ED Scribe. 10/21/16. 1:09 AM.   Time Seen: 00:03  History   Chief Complaint No chief complaint on file.  The history is provided by the patient. No language interpreter was used.    HPI Comments: Jacqueline Alvarado is a 21 y.o. female who presents to the Emergency Department complaining of a moderate, gradually worsening area of pain and swelling to the epigastric region of her abdomen onset April 8. Pt states pain was exacerbated with palpation and direct pressure. Pt reports the area looked like a little pimple and had drainage and is now followed by an area of increased area of redness, that occurred two days ago. Pt reports associated episodic vomiting accompanied by nausea and diarrhea that began today. She notes having ~7 episodes of vomiting with an "egg-yolk" appearance. Pt also notes having ~4 episodes of watery yellow diarrhea. She reports decreased urine output.  Pt has temp at home of 100.6. Pt's temperature in the ED today was 100.3. 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 No alleviating factors noted. Pt denies fever, chills. She states her sister gets boils/abscesses. Pt is a current marijuana smoker. She reports smoking four days throughout the week.   PCP- Dr. Gareth Morgan   Past Medical History:  Diagnosis Date  . Abnormal Pap smear of cervix   . Headache(784.0)   . Low iron    Hx: of  . PONV (postoperative nausea and vomiting)    Hx: of nausea only at age 51    Patient Active Problem List   Diagnosis Date Noted  . Hemorrhoid   . Rectal bleeding 12/20/2015  . RUQ pain 12/20/2015  . Hemorrhoids 12/20/2015    . Contraceptive management 09/10/2015  . Trichomonal vaginitis 09/10/2015  . BV (bacterial vaginosis) 09/10/2015  . Hypertrophy of nasal turbinates 06/24/2013  . Tonsillar hypertrophy 06/24/2013  . Obstructive sleep apnea (adult) (pediatric) 06/24/2013  . Morbid obesity (HCC) 06/24/2013    Past Surgical History:  Procedure Laterality Date  . COLONOSCOPY N/A 01/07/2016   Procedure: COLONOSCOPY;  Surgeon: West Bali, MD;  Location: [facility];  Service: Endoscopy;  Laterality: N/A;  12:15  . HEMORRHOID BANDING N/A 01/07/2016   Procedure: HEMORRHOID BANDING;  Surgeon: West Bali, MD;  Location: [facility];  Service: Endoscopy;  Laterality: N/A;  . NASAL SEPTOPLASTY W/ TURBINOPLASTY Bilateral 06/24/2013   Procedure: TURBINATE REDUCTION;  Surgeon: Flo Shanks, MD;  Location: [facility];  Service: ENT;  Laterality: Bilateral;  . TONSILLECTOMY AND ADENOIDECTOMY Bilateral 06/24/2013   Procedure: TONSILLECTOMY ;  Surgeon: Flo Shanks, MD;  Location[facility];  Service: ENT;  Laterality: Bilateral;  . TUMOR EXCISION     Hx: of right side of neck  . WISDOM TOOTH EXTRACTION      OB History    No data available       Home Medications    Prior to Admission medications   Medication Sig Start Date End Date Taking? Authorizing Provider  clonazePAM (KLONOPIN) 1 MG tablet Take 1 mg by mouth daily as needed for anxiety.  10/01/16  Yes Historical Provider, MD  escitalopram (LEXAPRO) 20 MG tablet Take  20 mg by mouth daily.  10/01/16  Yes Historical Provider, MD  norgestimate-ethinyl estradiol (MONONESSA) 0.25-35 MG-MCG tablet Take 1 tablet by mouth daily.   Yes Historical Provider, MD  ondansetron (ZOFRAN) 4 MG tablet Take 1 tablet (4 mg total) by mouth every 8 (eight) hours as needed. 10/22/16   Devoria Albe, MD  sulfamethoxazole-trimethoprim (BACTRIM DS,SEPTRA DS) 800-160 MG tablet Take 1 tablet by mouth 2 (two) times daily. 10/22/16   Devoria Albe, MD    Family History Family History  Problem  Relation Age of Onset  . Cancer - Other Mother   . Arthritis Mother   . Hypertension Father   . COPD Father   . Cancer - Cervical Other   . Diabetes Other   . Stroke Other     Social History Social History  Substance Use Topics  . Smoking status: Light Tobacco Smoker    Types: Cigarettes  . Smokeless tobacco: Never Used     Comment: pt smokes socially.  . Alcohol use 0.0 oz/week     Comment: occasional  employed Denies smoking cigarettes Smokes THC   Allergies   Patient has no known allergies.   Review of Systems Review of Systems  Constitutional: Negative for chills and fever.  Gastrointestinal: Positive for diarrhea and vomiting.  Skin: Positive for color change (abdominal area).  All other systems reviewed and are negative.    Physical Exam Updated Vital Signs BP (S) (!) 143/105 (BP Location: Right Arm)   Pulse 94   Temp 100.3 F (37.9 C) (Oral)   Resp 18   Ht 5' 7.5" (1.715 m)   Wt (!) 343 lb (155.6 kg)   SpO2 96%   BMI 52.93 kg/m   Physical Exam  Constitutional: She is oriented to person, place, and time. She appears well-developed and well-nourished.  Non-toxic appearance. She does not appear ill. No distress.  obese  HENT:  Head: Normocephalic and atraumatic.  Right Ear: External ear normal.  Left Ear: External ear normal.  Nose: Nose normal. No mucosal edema or rhinorrhea.  Mouth/Throat: Oropharynx is clear and moist. Mucous membranes are dry. No dental abscesses or uvula swelling.  Eyes: Conjunctivae and EOM are normal. Pupils are equal, round, and reactive to light.  Neck: Normal range of motion and full passive range of motion without pain. Neck supple.  Cardiovascular: Normal rate, regular rhythm and normal heart sounds.  Exam reveals no gallop and no friction rub.   No murmur heard. Pulmonary/Chest: Effort normal and breath sounds normal. No respiratory distress. She has no wheezes. She has no rhonchi. She has no rales. She exhibits no  tenderness and no crepitus.  Abdominal: Soft. Normal appearance and bowel sounds are normal. She exhibits no distension. There is no tenderness. There is no rebound and no guarding.  She's only tender over the redened area of upper abdomen.   Musculoskeletal: Normal range of motion. She exhibits no edema or tenderness.  Moves all extremities well.   Neurological: She is alert and oriented to person, place, and time. She has normal strength. No cranial nerve deficit.  Skin: Skin is warm, dry and intact. No rash noted. No erythema. No pallor.  Area of redness and warmth. 15 x 6cm   Psychiatric: She has a normal mood and affect. Her speech is normal and behavior is normal. Her mood appears not anxious.  Nursing note and vitals reviewed.         ED Treatments / Results   DIAGNOSTIC STUDIES: Oxygen Saturation  is 96% on RA, normal by my interpretation.    Labs (all labs ordered are listed, but only abnormal results are displayed) Results for orders placed or performed during the hospital encounter of 10/21/16  Lipase, blood  Result Value Ref Range   Lipase 16 11 - 51 U/L  Comprehensive metabolic panel  Result Value Ref Range   Sodium 134 (L) 135 - 145 mmol/L   Potassium 3.3 (L) 3.5 - 5.1 mmol/L   Chloride 101 101 - 111 mmol/L   CO2 24 22 - 32 mmol/L   Glucose, Bld 89 65 - 99 mg/dL   BUN 7 6 - 20 mg/dL   Creatinine, Ser 4.54 0.44 - 1.00 mg/dL   Calcium 9.0 8.9 - 09.8 mg/dL   Total Protein 7.8 6.5 - 8.1 g/dL   Albumin 3.7 3.5 - 5.0 g/dL   AST 20 15 - 41 U/L   ALT 14 14 - 54 U/L   Alkaline Phosphatase 28 (L) 38 - 126 U/L   Total Bilirubin 0.7 0.3 - 1.2 mg/dL   GFR calc non Af Amer >60 >60 mL/min   GFR calc Af Amer >60 >60 mL/min   Anion gap 9 5 - 15  CBC  Result Value Ref Range   WBC 21.8 (H) 4.0 - 10.5 K/uL   RBC 5.40 (H) 3.87 - 5.11 MIL/uL   Hemoglobin 12.3 12.0 - 15.0 g/dL   HCT 11.9 14.7 - 82.9 %   MCV 70.7 (L) 78.0 - 100.0 fL   MCH 22.8 (L) 26.0 - 34.0 pg   MCHC  32.2 30.0 - 36.0 g/dL   RDW 56.2 13.0 - 86.5 %   Platelets 369 150 - 400 K/uL  Urinalysis, Routine w reflex microscopic  Result Value Ref Range   Color, Urine YELLOW YELLOW   APPearance HAZY (A) CLEAR   Specific Gravity, Urine 1.018 1.005 - 1.030   pH 7.0 5.0 - 8.0   Glucose, UA NEGATIVE NEGATIVE mg/dL   Hgb urine dipstick NEGATIVE NEGATIVE   Bilirubin Urine NEGATIVE NEGATIVE   Ketones, ur 5 (A) NEGATIVE mg/dL   Protein, ur NEGATIVE NEGATIVE mg/dL   Nitrite NEGATIVE NEGATIVE   Leukocytes, UA TRACE (A) NEGATIVE   RBC / HPF 0-5 0 - 5 RBC/hpf   WBC, UA 0-5 0 - 5 WBC/hpf   Bacteria, UA NONE SEEN NONE SEEN   Squamous Epithelial / LPF 0-5 (A) NONE SEEN   Mucous PRESENT    Laboratory interpretation all normal except Leukocytosis and mild hypokalemia    EKG  EKG Interpretation None       Radiology No results found.  Procedures Procedures (including critical care time)  EMERGENCY DEPARTMENT US SOFT TISSUE INTERPRETATION "Study: Limited Soft Tissue Ultrasound"  INDICATIONS: Pain and Soft tissue infection Multiple views of the body part were obtained in real-time with a multi-frequency linear probe  PERFORMED BY: Myself IMAGES ARCHIVED?: Yes SIDE:Midline BODY PART:Abdominal wall INTERPRETATION:  No abcess noted + cellulitis noted    Medications Ordered in ED Medications  cefTRIAXone (ROCEPHIN) 1 g in dextrose 5 % 50 mL IVPB (0 g Intravenous Stopped 10/22/16 0214)  sodium chloride 0.9 % bolus 1,000 mL (1,000 mLs Intravenous New Bag/Given 10/22/16 0356)  sodium chloride 0.9 % bolus 1,000 mL (0 mLs Intravenous Stopped 10/22/16 0353)  ondansetron (ZOFRAN) injection 4 mg (4 mg Intravenous Given 10/22/16 0353)  acetaminophen (TYLENOL) tablet 1,000 mg (1,000 mg Oral Given 10/22/16 0411)     Initial Impression / Assessment and Plan / ED Course  I have reviewed the triage vital signs and the nursing notes.  Pertinent labs & imaging results that were available during my care  of the patient were reviewed by me and considered in my medical decision making (see chart for details).  COORDINATION OF CARE: 11:04 PM Discussed treatment plan with pt at bedside and pt agreed to plan.   Patient was given IV fluids and IV nausea medication. She was given IV Rocephin for her presumed cellulitis.  When I rechecked the patient at 4 AM she reports she's feeling better, she's had no more diarrhea or vomiting however she's not had a urinary output. She was given another liter of IV fluids.  Recheck it 5:30 AM patient is laying flat on her abdomen in no distress. Bedside ultrasound was done to look for abscess however there is no abscess seen.  Patient's IVs were slowed because she keeps moving her arms or IV doesn't run. After her third liter she was ready for discharge. She has been drinking oral fluids without any difficulty.  Final Clinical Impressions(s) / ED Diagnoses   Final diagnoses:  Nausea vomiting and diarrhea  Cellulitis, abdominal wall  Dehydration    New Prescriptions New Prescriptions   ONDANSETRON (ZOFRAN) 4 MG TABLET    Take 1 tablet (4 mg total) by mouth every 8 (eight) hours as needed.   SULFAMETHOXAZOLE-TRIMETHOPRIM (BACTRIM DS,SEPTRA DS) 800-160 MG TABLET    Take 1 tablet by mouth 2 (two) times daily.    Plan discharge  Devoria Albe, MD, Concha Pyo, MD 10/22/16 [phone]

## 2016-10-22 MED ORDER — ONDANSETRON HCL 4 MG/2ML IJ SOLN
4.0000 mg | Freq: Once | INTRAMUSCULAR | Status: AC
  Administered 2016-10-22: 4 mg via INTRAVENOUS

## 2016-10-22 MED ORDER — SODIUM CHLORIDE 0.9 % IV BOLUS (SEPSIS)
1000.0000 mL | Freq: Once | INTRAVENOUS | Status: AC
  Administered 2016-10-22: 1000 mL via INTRAVENOUS

## 2016-10-22 MED ORDER — SULFAMETHOXAZOLE-TRIMETHOPRIM 800-160 MG PO TABS: 1.0000 | ORAL_TABLET | Freq: Two times a day (BID) | ORAL | 0 refills | Status: DC

## 2016-10-22 MED ORDER — ONDANSETRON HCL 4 MG/2ML IJ SOLN
INTRAMUSCULAR | Status: AC
  Filled 2016-10-22: qty 2

## 2016-10-22 MED ORDER — ACETAMINOPHEN 500 MG PO TABS
1000.0000 mg | ORAL_TABLET | Freq: Once | ORAL | Status: AC
Start: 2016-10-22 — End: 2016-10-22
  Administered 2016-10-22: 1000 mg via ORAL
  Filled 2016-10-22: qty 2

## 2016-10-22 MED ORDER — DEXTROSE 5 % IV SOLN
1.0000 g | Freq: Once | INTRAVENOUS | Status: AC
  Administered 2016-10-22: 1 g via INTRAVENOUS
  Filled 2016-10-22: qty 10

## 2016-10-22 MED ORDER — ONDANSETRON HCL 4 MG PO TABS: 4.0000 mg | ORAL_TABLET | Freq: Three times a day (TID) | ORAL | 0 refills | Status: DC | PRN

## 2016-10-22 NOTE — Discharge Instructions (Signed)
Drink plenty of fluids (clear liquids) then start a bland diet later this morning such as toast, crackers, jello, [employer] chicken noodle soup. Use the zofran for nausea or vomiting. Take imodium OTC for diarrhea. Avoid milk products until the diarrhea is gone. Take the antibiotics until gone. Use heat/ice on your abdomen for comfort. Take ibuprfoen 600 mg + acetaminophen 1000 mg 4 times a day for pain.   Recheck if the area on your abdomen continues to get bigger or seems worse.

## 2016-11-04 ENCOUNTER — Ambulatory Visit (INDEPENDENT_AMBULATORY_CARE_PROVIDER_SITE_OTHER): Payer: BLUE CROSS/BLUE SHIELD | Admitting: General Surgery

## 2016-11-04 ENCOUNTER — Encounter: Payer: Self-pay | Admitting: General Surgery

## 2016-11-04 VITALS — BP 162/97 | HR 76 | Temp 97.7°F | Resp 18 | Ht 68.0 in | Wt 339.0 lb

## 2016-11-04 DIAGNOSIS — L02219 Cutaneous abscess of trunk, unspecified: Secondary | ICD-10-CM | POA: Diagnosis not present

## 2016-11-04 DIAGNOSIS — L03319 Cellulitis of trunk, unspecified: Secondary | ICD-10-CM | POA: Diagnosis not present

## 2016-11-04 NOTE — Patient Instructions (Signed)
Apply triple antibiotic twice a day to wound.  Keep clean with soap and water daily.

## 2016-11-04 NOTE — Progress Notes (Signed)
Jacqueline Alvarado; [id number]; 1996-01-04   HPI Patient is a 21 year old black female who was referred to my care by Dr. Sudie Bailey for evaluation treatment of an abscess on the abdominal wall. She underwent incision and drainage in his office and has been on antibiotics since. She presents today for wound care evaluation. She states this is the first time she has had an abscess on her abdominal wall. She denies any fever or chills. She does have a 4 out of 10 pain at the site. Past Medical History:  Diagnosis Date  . Abnormal Pap smear of cervix   . Headache(784.0)   . Low iron    Hx: of  . PONV (postoperative nausea and vomiting)    Hx: of nausea only at age 40    Past Surgical History:  Procedure Laterality Date  . COLONOSCOPY N/A 01/07/2016   Procedure: COLONOSCOPY;  Surgeon: West Bali, MD;  Location: [facility];  Service: Endoscopy;  Laterality: N/A;  12:15  . HEMORRHOID BANDING N/A 01/07/2016   Procedure: HEMORRHOID BANDING;  Surgeon: West Bali, MD;  Location: [facility];  Service: Endoscopy;  Laterality: N/A;  . NASAL SEPTOPLASTY W/ TURBINOPLASTY Bilateral 06/24/2013   Procedure: TURBINATE REDUCTION;  Surgeon: Flo Shanks, MD;  Location: [facility] OR;  Service: ENT;  Laterality: Bilateral;  . TONSILLECTOMY AND ADENOIDECTOMY Bilateral 06/24/2013   Procedure: TONSILLECTOMY ;  Surgeon: Flo Shanks, MD;  Location: [facility] OR;  Service: ENT;  Laterality: Bilateral;  . TUMOR EXCISION     Hx: of right side of neck  . WISDOM TOOTH EXTRACTION      Family History  Problem Relation Age of Onset  . Cancer - Other Mother   . Arthritis Mother   . Hypertension Father   . COPD Father   . Cancer - Cervical Other   . Diabetes Other   . Stroke Other     Current Outpatient Prescriptions on File Prior to Visit  Medication Sig Dispense Refill  . clonazePAM (KLONOPIN) 1 MG tablet Take 1 mg by mouth daily as needed for anxiety.     Marland Kitchen escitalopram (LEXAPRO) 20 MG tablet Take 20 mg by mouth  daily.     . norgestimate-ethinyl estradiol (MONONESSA) 0.25-35 MG-MCG tablet Take 1 tablet by mouth daily.    . ondansetron (ZOFRAN) 4 MG tablet Take 1 tablet (4 mg total) by mouth every 8 (eight) hours as needed. 6 tablet 0  . sulfamethoxazole-trimethoprim (BACTRIM DS,SEPTRA DS) 800-160 MG tablet Take 1 tablet by mouth 2 (two) times daily. 20 tablet 0   No current facility-administered medications on file prior to visit.     No Known Allergies  History  Alcohol Use  . 0.0 oz/week    Comment: occasional    History  Smoking Status  . Light Tobacco Smoker  . Types: Cigarettes  Smokeless Tobacco  . Never Used    Comment: pt smokes socially.    Review of Systems  Constitutional: Positive for malaise/fatigue.  HENT: Positive for sinus pain.   Eyes: Negative.   Respiratory: Negative.   Cardiovascular: Negative.   Gastrointestinal: Positive for nausea.  Genitourinary: Negative.   Musculoskeletal: Positive for back pain.  Skin: Positive for rash.  Neurological: Negative.   Endo/Heme/Allergies: Negative.   Psychiatric/Behavioral: Negative.     Objective   Vitals:   11/04/16 1034  BP: (!) 162/97  Pulse: 76  Resp: 18  Temp: 97.7 F (36.5 C)    Physical Exam  Constitutional: She is oriented to  person, place, and time and well-developed, well-nourished, and in no distress.  HENT:  Head: Normocephalic and atraumatic.  Neck: Normal range of motion. Neck supple.  Cardiovascular: Normal rate, regular rhythm and normal heart sounds.   No murmur heard. Pulmonary/Chest: Effort normal and breath sounds normal. She has no wheezes.  Abdominal: Soft. She exhibits no distension. There is tenderness.  Small indurated well-healing wound in the upper mid abdomen at the skin level. No purulent drainage noted. No erythema noted. There is induration in a linear pattern emanating from the wound. It is slightly tender to touch.  Neurological: She is alert and oriented to person, place,  and time.  Skin: Skin is warm and dry.  Vitals reviewed.   Assessment  Abscess, abdominal wall, resolving Plan   Keep wound clean daily with soap and water. Apply triple antibiotic ointment to wound twice a day. We'll reevaluate wound in 1 week. No need for further surgical intervention at this time.

## 2016-11-11 ENCOUNTER — Ambulatory Visit: Payer: BLUE CROSS/BLUE SHIELD | Admitting: General Surgery

## 2016-11-13 ENCOUNTER — Ambulatory Visit (INDEPENDENT_AMBULATORY_CARE_PROVIDER_SITE_OTHER): Payer: BLUE CROSS/BLUE SHIELD | Admitting: General Surgery

## 2016-11-13 ENCOUNTER — Encounter: Payer: Self-pay | Admitting: General Surgery

## 2016-11-13 VITALS — BP 144/77 | HR 66 | Temp 97.8°F | Resp 18 | Ht 68.0 in | Wt 341.0 lb

## 2016-11-13 DIAGNOSIS — L02219 Cutaneous abscess of trunk, unspecified: Secondary | ICD-10-CM | POA: Diagnosis not present

## 2016-11-13 DIAGNOSIS — L03319 Cellulitis of trunk, unspecified: Secondary | ICD-10-CM

## 2016-11-13 NOTE — Progress Notes (Signed)
Subjective:     Jacqueline Alvarado  Has had no drainage since I last saw her. No fevers. She has finished her antibiotic course. Objective:    BP (!) 144/77   Pulse 66   Temp 97.8 F (36.6 C)   Resp 18   Ht 5\' 8"  (1.727 m)   Wt (!) 341 lb (154.7 kg)   BMI 51.85 kg/m   General:  alert, cooperative and no distress  Abdominal wound approximately 4 mm in diameter. No drainage noted. She still has induration without fluctuance in the subcutaneous tissue and a linear-like fashion. It is nontender.     Assessment:    Abscess with cellulitis, abdominal wall, resolved    Plan:  Patient states she has a history of recurrence of this wound. I told her that if she desired, this could be excised. She once to think about it and will call with her decision.

## 2016-11-18 NOTE — Patient Instructions (Signed)
Jacqueline Alvarado  11/18/2016     @PREFPERIOPPHARMACY @   Your procedure is scheduled on  11/26/2016  Report to [facility] at  715  A.M.  Call this number if you have problems the morning of surgery:  [phone]   Remember:  Do not eat food or drink liquids after midnight.  Take these medicines the morning of surgery with A SIP OF WATER  Klonopin,lexapro, zofran.   Do not wear jewelry, make-up or nail polish.  Do not wear lotions, powders, or perfumes, or deoderant.  Do not shave 48 hours prior to surgery.  Men may shave face and neck.  Do not bring valuables to the hospital.  [facility] is not responsible for any belongings or valuables.  Contacts, dentures or bridgework may not be worn into surgery.  Leave your suitcase in the car.  After surgery it may be brought to your room.  For patients admitted to the hospital, discharge time will be determined by your treatment team.  Patients discharged the day of surgery will not be allowed to drive home.   Name and phone number of your driver:   family Special instructions:  None  Please read over the following fact sheets that you were given. Anesthesia Post-op Instructions and Care and Recovery After Surgery      Excision of Skin Lesions Excision of a skin lesion refers to the removal of a section of skin by making small cuts (incisions) in the skin. This procedure may be done to remove a cancerous (malignant) or noncancerous (benign) growth on the skin. It is typically done to treat or prevent cancer or infection. It may also be done to improve cosmetic appearance. The procedure may be done to remove:  Cancerous growths, such as basal cell carcinoma, squamous cell carcinoma, or melanoma.  Noncancerous growths, such as a cyst or lipoma.  Growths, such as moles or skin tags, which may be removed for cosmetic reasons. Various excision or surgical techniques may be used depending on your condition, the location of the  lesion, and your overall health. Tell a health care provider about:  Any allergies you have.  All medicines you are taking, including vitamins, herbs, eye drops, creams, and over-the-counter medicines.  Any problems you or family members have had with anesthetic medicines.  Any blood disorders you have.  Any surgeries you have had.  Any medical conditions you have.  Whether you are pregnant or may be pregnant. What are the risks? Generally, this is a safe procedure. However, problems may occur, including:  Bleeding.  Infection.  Scarring.  Recurrence of the cyst, lipoma, or cancer.  Changes in skin sensation or appearance, such as discoloration or swelling.  Reaction to the anesthetics.  Allergic reaction to surgical materials or ointments.  Damage to nerves, blood vessels, muscles, or other structures.  Continued pain. What happens before the procedure?  Ask your health care provider about:  Changing or stopping your regular medicines. This is especially important if you are taking diabetes medicines or blood thinners.  Taking medicines such as aspirin and ibuprofen. These medicines can thin your blood. Do not take these medicines before your procedure if your health care provider instructs you not to.  You may be asked to take certain medicines.  You may be asked to stop smoking.  You may have an exam or testing.  Plan to have someone take you home after the procedure.  Plan to have someone  help you with activities during recovery. What happens during the procedure?  To reduce your risk of infection:  Your health care team will wash or sanitize their hands.  Your skin will be washed with soap.  You will be given a medicine to numb the area (local anesthetic).  One of the following excision techniques will be performed.  At the end of any of these procedures, antibiotic ointment will be applied as needed. Each of the following techniques may vary  among health care providers and hospitals. Complete Surgical Excision  The area of skin that needs to be removed will be marked with a pen. Using a small scalpel or scissors, the surgeon will gently cut around and under the lesion until it is completely removed. The lesion will be placed in a fluid and sent to the lab for examination. If necessary, bleeding will be controlled with a device that delivers heat (electrocautery). The edges of the wound may be stitched (sutured) together, and a bandage (dressing) will be applied. This procedure may be performed to treat a cancerous growth or a noncancerous cyst or lesion. Excision of a Cyst  The surgeon will make an incision on the cyst. The entire cyst will be removed through the incision. The incision may be closed with sutures. Shave Excision  During shave excision, the surgeon will use a small blade or an electrically heated loop instrument to shave off the lesion. This may be done to remove a mole or a skin tag. The wound will usually be left to heal on its own without sutures. Punch Excision  During punch excision, the surgeon will use a small tool that is like a cookie cutter or a hole punch to cut a circle shape out of the skin. The outer edges of the skin will be sutured together. This may be done to remove a mole or a scar or to perform a biopsy of the lesion. Mohs Micrographic Surgery  During Mohs micrographic surgery, layers of the lesion will be removed with a scalpel or a loop instrument and will be examined right away under a microscope. Layers will be removed until all of the abnormal or cancerous tissue has been removed. This procedure is minimally invasive, and it ensures the best cosmetic outcome. It involves the removal of as little normal tissue as possible. Mohs is usually done to treat skin cancer, such as basal cell carcinoma or squamous cell carcinoma, particularly on the face and ears. Depending on the size of the surgical wound, it  may be sutured closed. What happens after the procedure?  Return to your normal activities as told by your health care provider.  Talk with your health care provider to discuss any test results, treatment options, and if necessary, the need for more tests. This information is not intended to replace advice given to you by your health care provider. Make sure you discuss any questions you have with your health care provider. Document Released: 09/24/2009 Document Revised: 12/06/2015 Document Reviewed: 08/16/2014 Elsevier Interactive Patient Education  2017 [employer] Excision of Skin Lesions, Care After Refer to this sheet in the next few weeks. These instructions provide you with information about caring for yourself after your procedure. Your health care provider may also give you more specific instructions. Your treatment has been planned according to current medical practices, but problems sometimes occur. Call your health care provider if you have any problems or questions after your procedure. What can I expect after the procedure? After your  procedure, it is common to have pain or discomfort at the excision site. Follow these instructions at home:  Take over-the-counter and prescription medicines only as told by your health care provider.  Follow instructions from your health care provider about:  How to take care of your excision site. You should keep the site clean, dry, and protected for at least 48 hours.  When and how you should change your bandage (dressing).  When you should remove your dressing.  Removing whatever was used to close your excision site.  Check the excision area every day for signs of infection. Watch for:  Redness, swelling, or pain.  Fluid, blood, or pus.  For bleeding, apply gentle but firm pressure to the area using a folded towel for 20 minutes.  Avoid high-impact exercise and activities until the stitches (sutures) are removed or the area  heals.  Follow instructions from your health care provider about how to minimize scarring. Avoid sun exposure until the area has healed. Scarring should lessen over time.  Keep all follow-up visits as told by your health care provider. This is important. Contact a health care provider if:  You have a fever.  You have redness, swelling, or pain at the excision site.  You have fluid, blood, or pus coming from the excision site.  You have ongoing bleeding at the excision site.  You have pain that does not improve in 2-3 days after your procedure.  You notice skin irregularities or changes in sensation. This information is not intended to replace advice given to you by your health care provider. Make sure you discuss any questions you have with your health care provider. Document Released: 11/14/2014 Document Revised: 12/06/2015 Document Reviewed: 08/16/2014 Elsevier Interactive Patient Education  2017 [employer]  General Anesthesia, Adult General anesthesia is the use of medicines to make a person "go to sleep" (be unconscious) for a medical procedure. General anesthesia is often recommended when a procedure:  Is long.  Requires you to be still or in an unusual position.  Is major and can cause you to lose blood.  Is impossible to do without general anesthesia. The medicines used for general anesthesia are called general anesthetics. In addition to making you sleep, the medicines:  Prevent pain.  Control your blood pressure.  Relax your muscles. Tell a health care provider about:  Any allergies you have.  All medicines you are taking, including vitamins, herbs, eye drops, creams, and over-the-counter medicines.  Any problems you or family members have had with anesthetic medicines.  Types of anesthetics you have had in the past.  Any bleeding disorders you have.  Any surgeries you have had.  Any medical conditions you have.  Any history of heart or lung  conditions, such as heart failure, sleep apnea, or chronic obstructive pulmonary disease (COPD).  Whether you are pregnant or may be pregnant.  Whether you use tobacco, alcohol, marijuana, or street drugs.  Any history of Financial planner.  Any history of depression or anxiety. What are the risks? Generally, this is a safe procedure. However, problems may occur, including:  Allergic reaction to anesthetics.  Lung and heart problems.  Inhaling food or liquids from your stomach into your lungs (aspiration).  Injury to nerves.  Waking up during your procedure and being unable to move (rare).  Extreme agitation or a state of mental confusion (delirium) when you wake up from the anesthetic.  Air in the bloodstream, which can lead to stroke. These problems are more likely  to develop if you are having a major surgery or if you have an advanced medical condition. You can prevent some of these complications by answering all of your health care provider's questions thoroughly and by following all pre-procedure instructions. General anesthesia can cause side effects, including:  Nausea or vomiting  A sore throat from the breathing tube.  Feeling cold or shivery.  Feeling tired, washed out, or achy.  Sleepiness or drowsiness.  Confusion or agitation. What happens before the procedure? Staying hydrated  Follow instructions from your health care provider about hydration, which may include:  Up to 2 hours before the procedure - you may continue to drink clear liquids, such as water, clear fruit juice, black coffee, and plain tea. Eating and drinking restrictions  Follow instructions from your health care provider about eating and drinking, which may include:  8 hours before the procedure - stop eating heavy meals or foods such as meat, fried foods, or fatty foods.  6 hours before the procedure - stop eating light meals or foods, such as toast or cereal.  6 hours before the  procedure - stop drinking milk or drinks that contain milk.  2 hours before the procedure - stop drinking clear liquids. Medicines   Ask your health care provider about:  Changing or stopping your regular medicines. This is especially important if you are taking diabetes medicines or blood thinners.  Taking medicines such as aspirin and ibuprofen. These medicines can thin your blood. Do not take these medicines before your procedure if your health care provider instructs you not to.  Taking new dietary supplements or medicines. Do not take these during the week before your procedure unless your health care provider approves them.  If you are told to take a medicine or to continue taking a medicine on the day of the procedure, take the medicine with sips of water. General instructions    Ask if you will be going home the same day, the following day, or after a longer hospital stay.  Plan to have someone take you home.  Plan to have someone stay with you for the first 24 hours after you leave the hospital or clinic.  For 3-6 weeks before the procedure, try not to use any tobacco products, such as cigarettes, chewing tobacco, and e-cigarettes.  You may brush your teeth on the morning of the procedure, but make sure to spit out the toothpaste. What happens during the procedure?  You will be given anesthetics through a mask and through an IV tube in one of your veins.  You may receive medicine to help you relax (sedative).  As soon as you are asleep, a breathing tube may be used to help you breathe.  An anesthesia specialist will stay with you throughout the procedure. He or she will help keep you comfortable and safe by continuing to give you medicines and adjusting the amount of medicine that you get. He or she will also watch your blood pressure, pulse, and oxygen levels to make sure that the anesthetics do not cause any problems.  If a breathing tube was used to help you breathe,  it will be removed before you wake up. The procedure may vary among health care providers and hospitals. What happens after the procedure?  You will wake up, often slowly, after the procedure is complete, usually in a recovery area.  Your blood pressure, heart rate, breathing rate, and blood oxygen level will be monitored until the medicines you were given  have worn off.  You may be given medicine to help you calm down if you feel anxious or agitated.  If you will be going home the same day, your health care provider may check to make sure you can stand, drink, and urinate.  Your health care providers will treat your pain and side effects before you go home.  Do not drive for 24 hours if you received a sedative.  You may:  Feel nauseous and vomit.  Have a sore throat.  Have mental slowness.  Feel cold or shivery.  Feel sleepy.  Feel tired.  Feel sore or achy, even in parts of your body where you did not have surgery. This information is not intended to replace advice given to you by your health care provider. Make sure you discuss any questions you have with your health care provider. Document Released: 10/07/2007 Document Revised: 12/11/2015 Document Reviewed: 06/14/2015 Elsevier Interactive Patient Education  2017 Elsevier Inc. General Anesthesia, Adult, Care After These instructions provide you with information about caring for yourself after your procedure. Your health care provider may also give you more specific instructions. Your treatment has been planned according to current medical practices, but problems sometimes occur. Call your health care provider if you have any problems or questions after your procedure. What can I expect after the procedure? After the procedure, it is common to have:  Vomiting.  A sore throat.  Mental slowness. It is common to feel:  Nauseous.  Cold or shivery.  Sleepy.  Tired.  Sore or achy, even in parts of your body where  you did not have surgery. Follow these instructions at home: For at least 24 hours after the procedure:   Do not:  Participate in activities where you could fall or become injured.  Drive.  Use heavy machinery.  Drink alcohol.  Take sleeping pills or medicines that cause drowsiness.  Make important decisions or sign legal documents.  Take care of children on your own.  Rest. Eating and drinking   If you vomit, drink water, juice, or soup when you can drink without vomiting.  Drink enough fluid to keep your urine clear or pale yellow.  Make sure you have little or no nausea before eating solid foods.  Follow the diet recommended by your health care provider. General instructions   Have a responsible adult stay with you until you are awake and alert.  Return to your normal activities as told by your health care provider. Ask your health care provider what activities are safe for you.  Take over-the-counter and prescription medicines only as told by your health care provider.  If you smoke, do not smoke without supervision.  Keep all follow-up visits as told by your health care provider. This is important. Contact a health care provider if:  You continue to have nausea or vomiting at home, and medicines are not helpful.  You cannot drink fluids or start eating again.  You cannot urinate after 8-12 hours.  You develop a skin rash.  You have fever.  You have increasing redness at the site of your procedure. Get help right away if:  You have difficulty breathing.  You have chest pain.  You have unexpected bleeding.  You feel that you are having a life-threatening or urgent problem. This information is not intended to replace advice given to you by your health care provider. Make sure you discuss any questions you have with your health care provider. Document Released: 10/06/2000 Document Revised: 12/03/2015  Document Reviewed: 06/14/2015 Elsevier Interactive  Patient Education  2017 [employer].

## 2016-11-21 ENCOUNTER — Encounter (HOSPITAL_COMMUNITY)
Admission: RE | Admit: 2016-11-21 | Discharge: 2016-11-21 | Disposition: A | Discharge status: Home / Self Care | Payer: BLUE CROSS/BLUE SHIELD | Source: Ambulatory Visit | Attending: General Surgery | Admitting: General Surgery

## 2016-11-21 ENCOUNTER — Encounter (HOSPITAL_COMMUNITY): Payer: Self-pay

## 2016-11-21 DIAGNOSIS — K649 Unspecified hemorrhoids: Secondary | ICD-10-CM | POA: Diagnosis not present

## 2016-11-21 DIAGNOSIS — Z01812 Encounter for preprocedural laboratory examination: Secondary | ICD-10-CM | POA: Diagnosis not present

## 2016-11-21 DIAGNOSIS — R1011 Right upper quadrant pain: Secondary | ICD-10-CM | POA: Diagnosis not present

## 2016-11-21 DIAGNOSIS — B9689 Other specified bacterial agents as the cause of diseases classified elsewhere: Secondary | ICD-10-CM | POA: Insufficient documentation

## 2016-11-21 DIAGNOSIS — J351 Hypertrophy of tonsils: Secondary | ICD-10-CM | POA: Diagnosis not present

## 2016-11-21 DIAGNOSIS — A5901 Trichomonal vulvovaginitis: Secondary | ICD-10-CM | POA: Diagnosis not present

## 2016-11-21 DIAGNOSIS — G4733 Obstructive sleep apnea (adult) (pediatric): Secondary | ICD-10-CM | POA: Diagnosis not present

## 2016-11-21 DIAGNOSIS — J343 Hypertrophy of nasal turbinates: Secondary | ICD-10-CM | POA: Diagnosis not present

## 2016-11-21 DIAGNOSIS — N760 Acute vaginitis: Secondary | ICD-10-CM | POA: Insufficient documentation

## 2016-11-21 DIAGNOSIS — K625 Hemorrhage of anus and rectum: Secondary | ICD-10-CM | POA: Diagnosis not present

## 2016-11-21 HISTORY — DX: Major depressive disorder, single episode, unspecified: F32.9

## 2016-11-21 HISTORY — DX: Anxiety disorder, unspecified: F41.9

## 2016-11-21 HISTORY — DX: Anemia, unspecified: D64.9

## 2016-11-21 HISTORY — DX: Depression, unspecified: F32.A

## 2016-11-21 LAB — BASIC METABOLIC PANEL
Anion gap: 6 (ref 5–15)
BUN: 20 mg/dL (ref 6–20)
CALCIUM: 8.3 mg/dL — AB (ref 8.9–10.3)
CO2: 23 mmol/L (ref 22–32)
CREATININE: 0.77 mg/dL (ref 0.44–1.00)
Chloride: 108 mmol/L (ref 101–111)
Glucose, Bld: 106 mg/dL — ABNORMAL HIGH (ref 65–99)
Potassium: 3.7 mmol/L (ref 3.5–5.1)
SODIUM: 137 mmol/L (ref 135–145)

## 2016-11-21 LAB — CBC WITH DIFFERENTIAL/PLATELET
BASOS ABS: 0.1 10*3/uL (ref 0.0–0.1)
BASOS PCT: 1 %
EOS ABS: 0.6 10*3/uL (ref 0.0–0.7)
Eosinophils Relative: 7 %
HCT: 33.9 % — ABNORMAL LOW (ref 36.0–46.0)
HEMOGLOBIN: 10.8 g/dL — AB (ref 12.0–15.0)
Lymphocytes Relative: 33 %
Lymphs Abs: 3 10*3/uL (ref 0.7–4.0)
MCH: 22.9 pg — ABNORMAL LOW (ref 26.0–34.0)
MCHC: 31.9 g/dL (ref 30.0–36.0)
MCV: 71.8 fL — ABNORMAL LOW (ref 78.0–100.0)
Monocytes Absolute: 0.9 10*3/uL (ref 0.1–1.0)
Monocytes Relative: 10 %
NEUTROS PCT: 49 %
Neutro Abs: 4.7 10*3/uL (ref 1.7–7.7)
Platelets: 263 10*3/uL (ref 150–400)
RBC: 4.72 MIL/uL (ref 3.87–5.11)
RDW: 16 % — ABNORMAL HIGH (ref 11.5–15.5)
WBC: 9.3 10*3/uL (ref 4.0–10.5)

## 2016-11-21 LAB — HCG, SERUM, QUALITATIVE: Preg, Serum: NEGATIVE

## 2016-11-21 NOTE — Progress Notes (Signed)
   11/21/16 0810  OBSTRUCTIVE SLEEP APNEA  Have you ever been diagnosed with sleep apnea through a sleep study? No  Do you snore loudly (loud enough to be heard through closed doors)?  1  Do you often feel tired, fatigued, or sleepy during the daytime (such as falling asleep during driving or talking to someone)? 1  Has anyone observed you stop breathing during your sleep? 1  Do you have, or are you being treated for high blood pressure? 0  BMI more than 35 kg/m2? 1  Age > 50 (1-yes) 0  Neck circumference greater than:Female 16 inches or larger, Female 17inches or larger? 1  Female Gender (Yes=1) 0  Obstructive Sleep Apnea Score 5  Score 5 or greater  Results sent to PCP

## 2016-11-21 NOTE — H&P (Signed)
Jacqueline Alvarado; [id number]; 01-05-1996   HPI Patient is a 21 year old black female who was referred to my care by Dr. Sudie Bailey for evaluation treatment of an abscess on the abdominal wall. She underwent incision and drainage in his office and has been on antibiotics since. She presents today for wound care evaluation. She states this is the first time she has had an abscess on her abdominal wall. She denies any fever or chills. She does have a 4 out of 10 pain at the site.     Past Medical History:  Diagnosis Date  . Abnormal Pap smear of cervix   . Headache(784.0)   . Low iron    Hx: of  . PONV (postoperative nausea and vomiting)    Hx: of nausea only at age 8         Past Surgical History:  Procedure Laterality Date  . COLONOSCOPY N/A 01/07/2016   Procedure: COLONOSCOPY;  Surgeon: West Bali, MD;  Location: [facility];  Service: Endoscopy;  Laterality: N/A;  12:15  . HEMORRHOID BANDING N/A 01/07/2016   Procedure: HEMORRHOID BANDING;  Surgeon: West Bali, MD;  Location: [facility];  Service: Endoscopy;  Laterality: N/A;  . NASAL SEPTOPLASTY W/ TURBINOPLASTY Bilateral 06/24/2013   Procedure: TURBINATE REDUCTION;  Surgeon: Flo Shanks, MD;  Location: [facility] OR;  Service: ENT;  Laterality: Bilateral;  . TONSILLECTOMY AND ADENOIDECTOMY Bilateral 06/24/2013   Procedure: TONSILLECTOMY ;  Surgeon: Flo Shanks, MD;  Location: [facility] OR;  Service: ENT;  Laterality: Bilateral;  . TUMOR EXCISION     Hx: of right side of neck  . WISDOM TOOTH EXTRACTION           Family History  Problem Relation Age of Onset  . Cancer - Other Mother   . Arthritis Mother   . Hypertension Father   . COPD Father   . Cancer - Cervical Other   . Diabetes Other   . Stroke Other           Current Outpatient Prescriptions on File Prior to Visit  Medication Sig Dispense Refill  . clonazePAM (KLONOPIN) 1 MG tablet Take 1 mg by mouth daily as needed for anxiety.     Marland Kitchen  escitalopram (LEXAPRO) 20 MG tablet Take 20 mg by mouth daily.     . norgestimate-ethinyl estradiol (MONONESSA) 0.25-35 MG-MCG tablet Take 1 tablet by mouth daily.    . ondansetron (ZOFRAN) 4 MG tablet Take 1 tablet (4 mg total) by mouth every 8 (eight) hours as needed. 6 tablet 0  . sulfamethoxazole-trimethoprim (BACTRIM DS,SEPTRA DS) 800-160 MG tablet Take 1 tablet by mouth 2 (two) times daily. 20 tablet 0   No current facility-administered medications on file prior to visit.     No Known Allergies      History  Alcohol Use  . 0.0 oz/week    Comment: occasional    History  Smoking Status  . Light Tobacco Smoker  . Types: Cigarettes  Smokeless Tobacco  . Never Used    Comment: pt smokes socially.    Review of Systems  Constitutional: Positive for malaise/fatigue.  HENT: Positive for sinus pain.   Eyes: Negative.   Respiratory: Negative.   Cardiovascular: Negative.   Gastrointestinal: Positive for nausea.  Genitourinary: Negative.   Musculoskeletal: Positive for back pain.  Skin: Positive for rash.  Neurological: Negative.   Endo/Heme/Allergies: Negative.   Psychiatric/Behavioral: Negative.     Objective      Vitals:   11/04/16 1034  BP: (!) 162/97  Pulse: 76  Resp: 18  Temp: 97.7 F (36.5 C)    Physical Exam  Constitutional: She is oriented to person, place, and time and well-developed, well-nourished, and in no distress.  HENT:  Head: Normocephalic and atraumatic.  Neck: Normal range of motion. Neck supple.  Cardiovascular: Normal rate, regular rhythm and normal heart sounds.   No murmur heard. Pulmonary/Chest: Effort normal and breath sounds normal. She has no wheezes.  Abdominal: Soft. She exhibits no distension. There is tenderness.  Small indurated well-healing wound in the upper mid abdomen at the skin level. No purulent drainage noted. No erythema noted. There is induration in a linear pattern emanating from the wound. It is  slightly tender to touch.  Neurological: She is alert and oriented to person, place, and time.  Skin: Skin is warm and dry.  Vitals reviewed.   Assessment  Abscess, abdominal wall, resolving Plan   Scheduled for excision of cicatrix, abdominal wall.  Risks and benefits of procedure were fully explained to the patient, who gives informed consent.

## 2016-11-24 NOTE — Pre-Procedure Instructions (Signed)
Hgb and Hct 10.8/ 33.9, shown to Dr Lovell SheehanJenkins. No orders given.

## 2016-11-26 ENCOUNTER — Encounter (HOSPITAL_COMMUNITY)
Admission: RE | Disposition: A | Discharge status: Home / Self Care | Payer: Self-pay | Source: Ambulatory Visit | Attending: General Surgery

## 2016-11-26 ENCOUNTER — Ambulatory Visit (HOSPITAL_COMMUNITY): Payer: BLUE CROSS/BLUE SHIELD | Admitting: Anesthesiology

## 2016-11-26 ENCOUNTER — Ambulatory Visit (HOSPITAL_COMMUNITY)
Admission: RE | Admit: 2016-11-26 | Discharge: 2016-11-26 | Disposition: A | Discharge status: Home / Self Care | Payer: BLUE CROSS/BLUE SHIELD | Source: Ambulatory Visit | Attending: General Surgery | Admitting: General Surgery

## 2016-11-26 ENCOUNTER — Encounter (HOSPITAL_COMMUNITY): Payer: Self-pay | Admitting: *Deleted

## 2016-11-26 DIAGNOSIS — Z793 Long term (current) use of hormonal contraceptives: Secondary | ICD-10-CM | POA: Insufficient documentation

## 2016-11-26 DIAGNOSIS — L905 Scar conditions and fibrosis of skin: Secondary | ICD-10-CM

## 2016-11-26 DIAGNOSIS — Z6841 Body Mass Index (BMI) 40.0 and over, adult: Secondary | ICD-10-CM | POA: Insufficient documentation

## 2016-11-26 DIAGNOSIS — F17210 Nicotine dependence, cigarettes, uncomplicated: Secondary | ICD-10-CM | POA: Diagnosis not present

## 2016-11-26 DIAGNOSIS — L03311 Cellulitis of abdominal wall: Secondary | ICD-10-CM | POA: Diagnosis not present

## 2016-11-26 DIAGNOSIS — F418 Other specified anxiety disorders: Secondary | ICD-10-CM | POA: Diagnosis not present

## 2016-11-26 DIAGNOSIS — Z79899 Other long term (current) drug therapy: Secondary | ICD-10-CM | POA: Diagnosis not present

## 2016-11-26 HISTORY — PX: MASS EXCISION: SHX2000

## 2016-11-26 SURGERY — EXCISION MASS
Anesthesia: General

## 2016-11-26 MED ORDER — PROPOFOL 10 MG/ML IV BOLUS
INTRAVENOUS | Status: AC
  Filled 2016-11-26: qty 20

## 2016-11-26 MED ORDER — GLYCOPYRROLATE 0.2 MG/ML IJ SOLN
0.2000 mg | Freq: Once | INTRAMUSCULAR | Status: AC
  Administered 2016-11-26: 0.2 mg via INTRAVENOUS
  Filled 2016-11-26: qty 1

## 2016-11-26 MED ORDER — ONDANSETRON HCL 4 MG/2ML IJ SOLN
4.0000 mg | Freq: Once | INTRAMUSCULAR | Status: AC
  Administered 2016-11-26: 4 mg via INTRAVENOUS

## 2016-11-26 MED ORDER — FENTANYL CITRATE (PF) 100 MCG/2ML IJ SOLN
INTRAMUSCULAR | Status: DC | PRN
  Administered 2016-11-26 (×2): 50 ug via INTRAVENOUS

## 2016-11-26 MED ORDER — ROCURONIUM BROMIDE 50 MG/5ML IV SOLN
INTRAVENOUS | Status: AC
  Filled 2016-11-26: qty 1

## 2016-11-26 MED ORDER — CEFAZOLIN SODIUM-DEXTROSE 1-4 GM/50ML-% IV SOLN
1.0000 g | Freq: Once | INTRAVENOUS | Status: DC
  Filled 2016-11-26: qty 50

## 2016-11-26 MED ORDER — MIDAZOLAM HCL 2 MG/2ML IJ SOLN
1.0000 mg | INTRAMUSCULAR | Status: AC
  Administered 2016-11-26 (×2): 2 mg via INTRAVENOUS
  Filled 2016-11-26 (×2): qty 2

## 2016-11-26 MED ORDER — KETOROLAC TROMETHAMINE 30 MG/ML IJ SOLN
INTRAMUSCULAR | Status: AC
  Filled 2016-11-26: qty 1

## 2016-11-26 MED ORDER — HYDROCODONE-ACETAMINOPHEN 5-325 MG PO TABS: 1.0000 | ORAL_TABLET | ORAL | 0 refills | Status: DC | PRN

## 2016-11-26 MED ORDER — BUPIVACAINE LIPOSOME 1.3 % IJ SUSP
INTRAMUSCULAR | Status: AC
  Filled 2016-11-26: qty 20

## 2016-11-26 MED ORDER — KETOROLAC TROMETHAMINE 30 MG/ML IJ SOLN
30.0000 mg | Freq: Once | INTRAMUSCULAR | Status: AC
  Administered 2016-11-26: 30 mg via INTRAVENOUS

## 2016-11-26 MED ORDER — MIDAZOLAM HCL 5 MG/5ML IJ SOLN
INTRAMUSCULAR | Status: DC | PRN
  Administered 2016-11-26: 2 mg via INTRAVENOUS

## 2016-11-26 MED ORDER — FENTANYL CITRATE (PF) 100 MCG/2ML IJ SOLN
INTRAMUSCULAR | Status: AC
  Filled 2016-11-26: qty 2

## 2016-11-26 MED ORDER — BUPIVACAINE HCL (PF) 0.5 % IJ SOLN
INTRAMUSCULAR | Status: AC
  Filled 2016-11-26: qty 30

## 2016-11-26 MED ORDER — ONDANSETRON HCL 4 MG/2ML IJ SOLN
4.0000 mg | Freq: Once | INTRAMUSCULAR | Status: AC
  Administered 2016-11-26: 4 mg via INTRAVENOUS
  Filled 2016-11-26: qty 2

## 2016-11-26 MED ORDER — 0.9 % SODIUM CHLORIDE (POUR BTL) OPTIME
TOPICAL | Status: DC | PRN
  Administered 2016-11-26: 200 mL

## 2016-11-26 MED ORDER — CEFAZOLIN SODIUM-DEXTROSE 2-4 GM/100ML-% IV SOLN
2.0000 g | Freq: Once | INTRAVENOUS | Status: AC
  Administered 2016-11-26: 3 g via INTRAVENOUS
  Filled 2016-11-26: qty 100

## 2016-11-26 MED ORDER — ROCURONIUM BROMIDE 100 MG/10ML IV SOLN
INTRAVENOUS | Status: DC | PRN
  Administered 2016-11-26: 5 mg via INTRAVENOUS
  Administered 2016-11-26: 15 mg via INTRAVENOUS

## 2016-11-26 MED ORDER — POVIDONE-IODINE 10 % EX OINT
TOPICAL_OINTMENT | CUTANEOUS | Status: AC
  Filled 2016-11-26: qty 1

## 2016-11-26 MED ORDER — ONDANSETRON HCL 4 MG PO TABS: 4.0000 mg | ORAL_TABLET | Freq: Three times a day (TID) | ORAL | 0 refills | Status: DC | PRN

## 2016-11-26 MED ORDER — CHLORHEXIDINE GLUCONATE CLOTH 2 % EX PADS: 6.0000 | MEDICATED_PAD | Freq: Once | CUTANEOUS | Status: DC

## 2016-11-26 MED ORDER — NEOSTIGMINE METHYLSULFATE 10 MG/10ML IV SOLN
INTRAVENOUS | Status: AC
  Filled 2016-11-26: qty 1

## 2016-11-26 MED ORDER — MIDAZOLAM HCL 2 MG/2ML IJ SOLN
INTRAMUSCULAR | Status: AC
  Filled 2016-11-26: qty 2

## 2016-11-26 MED ORDER — PROPOFOL 10 MG/ML IV BOLUS
INTRAVENOUS | Status: DC | PRN
  Administered 2016-11-26: 50 mg via INTRAVENOUS
  Administered 2016-11-26: 200 mg via INTRAVENOUS
  Administered 2016-11-26: 50 mg via INTRAVENOUS

## 2016-11-26 MED ORDER — BUPIVACAINE LIPOSOME 1.3 % IJ SUSP
INTRAMUSCULAR | Status: DC | PRN
  Administered 2016-11-26: 20 mL

## 2016-11-26 MED ORDER — LIDOCAINE-EPINEPHRINE (PF) 1 %-1:200000 IJ SOLN
INTRAMUSCULAR | Status: AC
  Filled 2016-11-26: qty 30

## 2016-11-26 MED ORDER — POVIDONE-IODINE 10 % OINT PACKET
TOPICAL_OINTMENT | CUTANEOUS | Status: DC | PRN
  Administered 2016-11-26: 1 via TOPICAL

## 2016-11-26 MED ORDER — CEFAZOLIN SODIUM 10 G IJ SOLR: 3.0000 g | INTRAMUSCULAR | Status: DC

## 2016-11-26 MED ORDER — LIDOCAINE HCL 1 % IJ SOLN
INTRAMUSCULAR | Status: DC | PRN
  Administered 2016-11-26: 35 mg via INTRADERMAL

## 2016-11-26 MED ORDER — ONDANSETRON HCL 4 MG/2ML IJ SOLN
INTRAMUSCULAR | Status: AC
  Filled 2016-11-26: qty 2

## 2016-11-26 MED ORDER — GLYCOPYRROLATE 0.2 MG/ML IJ SOLN
INTRAMUSCULAR | Status: AC
  Filled 2016-11-26: qty 3

## 2016-11-26 MED ORDER — FENTANYL CITRATE (PF) 100 MCG/2ML IJ SOLN
25.0000 ug | INTRAMUSCULAR | Status: DC | PRN
  Administered 2016-11-26: 25 ug via INTRAVENOUS
  Administered 2016-11-26: 50 ug via INTRAVENOUS

## 2016-11-26 MED ORDER — NEOSTIGMINE METHYLSULFATE 10 MG/10ML IV SOLN
INTRAVENOUS | Status: DC | PRN
  Administered 2016-11-26 (×2): 2 mg via INTRAVENOUS

## 2016-11-26 MED ORDER — SUCCINYLCHOLINE CHLORIDE 20 MG/ML IJ SOLN
INTRAMUSCULAR | Status: DC | PRN
  Administered 2016-11-26: 180 mg via INTRAVENOUS

## 2016-11-26 MED ORDER — SUCCINYLCHOLINE CHLORIDE 20 MG/ML IJ SOLN
INTRAMUSCULAR | Status: AC
  Filled 2016-11-26: qty 1

## 2016-11-26 MED ORDER — LACTATED RINGERS IV SOLN
INTRAVENOUS | Status: DC
  Administered 2016-11-26: 08:00:00 via INTRAVENOUS

## 2016-11-26 MED ORDER — LIDOCAINE HCL (PF) 1 % IJ SOLN
INTRAMUSCULAR | Status: AC
  Filled 2016-11-26: qty 5

## 2016-11-26 MED ORDER — GLYCOPYRROLATE 0.2 MG/ML IJ SOLN
INTRAMUSCULAR | Status: DC | PRN
  Administered 2016-11-26: 0.6 mg via INTRAVENOUS

## 2016-11-26 MED ORDER — FENTANYL CITRATE (PF) 100 MCG/2ML IJ SOLN
INTRAMUSCULAR | Status: AC
  Filled 2016-11-26: qty 4

## 2016-11-26 SURGICAL SUPPLY — 39 items
BAG HAMPER (MISCELLANEOUS) ×3 IMPLANT
BLADE SURG SZ11 CARB STEEL (BLADE) IMPLANT
CHLORAPREP W/TINT 10.5 ML (MISCELLANEOUS) ×3 IMPLANT
CLOTH BEACON ORANGE TIMEOUT ST (SAFETY) ×3 IMPLANT
COVER LIGHT HANDLE STERIS (MISCELLANEOUS) ×6 IMPLANT
DECANTER SPIKE VIAL GLASS SM (MISCELLANEOUS) IMPLANT
DERMABOND ADVANCED (GAUZE/BANDAGES/DRESSINGS)
DERMABOND ADVANCED .7 DNX12 (GAUZE/BANDAGES/DRESSINGS) IMPLANT
DRAPE EENT ADH APERT 31X51 STR (DRAPES) IMPLANT
ELECT NEEDLE TIP 2.8 STRL (NEEDLE) IMPLANT
ELECT REM PT RETURN 9FT ADLT (ELECTROSURGICAL) ×3
ELECTRODE REM PT RTRN 9FT ADLT (ELECTROSURGICAL) ×1 IMPLANT
FORMALIN 10 PREFIL 120ML (MISCELLANEOUS) ×3 IMPLANT
GAUZE SPONGE 4X4 12PLY STRL LF (GAUZE/BANDAGES/DRESSINGS) ×3 IMPLANT
GLOVE BIOGEL PI IND STRL 7.0 (GLOVE) ×1 IMPLANT
GLOVE BIOGEL PI INDICATOR 7.0 (GLOVE) ×2
GLOVE SURG SS PI 7.5 STRL IVOR (GLOVE) ×3 IMPLANT
GOWN STRL REUS W/ TWL XL LVL3 (GOWN DISPOSABLE) ×1 IMPLANT
GOWN STRL REUS W/TWL LRG LVL3 (GOWN DISPOSABLE) ×3 IMPLANT
GOWN STRL REUS W/TWL XL LVL3 (GOWN DISPOSABLE) ×2
KIT ROOM TURNOVER APOR (KITS) ×3 IMPLANT
MANIFOLD NEPTUNE II (INSTRUMENTS) ×3 IMPLANT
NEEDLE 22X1 1/2 OR ONLY (MISCELLANEOUS)
NEEDLE 22X1.5 STRL (OR ONLY) (MISCELLANEOUS) IMPLANT
NEEDLE HYPO 25X1 1.5 SAFETY (NEEDLE) ×3 IMPLANT
NS IRRIG 1000ML POUR BTL (IV SOLUTION) ×3 IMPLANT
PACK MINOR (CUSTOM PROCEDURE TRAY) ×3 IMPLANT
PAD ARMBOARD 7.5X6 YLW CONV (MISCELLANEOUS) ×3 IMPLANT
SET BASIN LINEN APH (SET/KITS/TRAYS/PACK) ×3 IMPLANT
STAPLER VISISTAT (STAPLE) ×3 IMPLANT
SUT ETHILON 3 0 FSL (SUTURE) IMPLANT
SUT PROLENE 4 0 PS 2 18 (SUTURE) IMPLANT
SUT VIC AB 2-0 CT1 27 (SUTURE) ×3
SUT VIC AB 2-0 CT1 TAPERPNT 27 (SUTURE) ×1 IMPLANT
SUT VIC AB 3-0 SH 27 (SUTURE)
SUT VIC AB 3-0 SH 27X BRD (SUTURE) IMPLANT
SUT VIC AB 4-0 PS2 27 (SUTURE) IMPLANT
SYR CONTROL 10ML LL (SYRINGE) ×3 IMPLANT
TAPE CLOTH SURG 4X10 WHT LF (GAUZE/BANDAGES/DRESSINGS) ×3 IMPLANT

## 2016-11-26 NOTE — Transfer of Care (Signed)
Immediate Anesthesia Transfer of Care Note  Patient: Jacqueline Alvarado  Procedure(s) Performed: Procedure(s) with comments: EXCISION OF ABDOMINAL CICATRIX 4CM (N/A) - p knows to arrive at 7:50  Patient Location: PACU  Anesthesia Type:General  Level of Consciousness: awake and patient cooperative  Airway & Oxygen Therapy: Patient Spontanous Breathing and Patient connected to face mask oxygen  Post-op Assessment: Report given to RN, Post -op Vital signs reviewed and stable and Patient moving all extremities  Post vital signs: Reviewed and stable  Last Vitals:  Vitals:   11/26/16 0801 11/26/16 0804  BP:  (!) 104/59  Pulse: 65   Resp: 18   Temp: 36.8 C     Last Pain:  Vitals:   11/26/16 0801  TempSrc: Oral         Complications: No apparent anesthesia complications

## 2016-11-26 NOTE — Anesthesia Preprocedure Evaluation (Addendum)
Anesthesia Evaluation  Patient identified by MRN, date of birth, ID band Patient awake    Reviewed: Allergy & Precautions, H&P , NPO status , Patient's Chart, lab work & pertinent test results  History of Anesthesia Complications (+) PONV and history of anesthetic complications  Airway Mallampati: II  TM Distance: >3 FB Neck ROM: Full    Dental  (+) Teeth Intact, Dental Advisory Given   Pulmonary sleep apnea (not diagnosed yet) , former smoker,    breath sounds clear to auscultation       Cardiovascular negative cardio ROS   Rhythm:Regular Rate:Normal     Neuro/Psych  Headaches, PSYCHIATRIC DISORDERS Anxiety Depression    GI/Hepatic negative GI ROS,   Endo/Other  Morbid obesity  Renal/GU      Musculoskeletal   Abdominal (+) + obese,   Peds  Hematology  (+) anemia ,   Anesthesia Other Findings N&V this morning. Neg beta HCG.  Reproductive/Obstetrics                             Anesthesia Physical Anesthesia Plan  ASA: II  Anesthesia Plan: General   Post-op Pain Management:    Induction: Intravenous, Rapid sequence and Cricoid pressure planned  Airway Management Planned: Oral ETT  Additional Equipment:   Intra-op Plan:   Post-operative Plan: Extubation in OR  Informed Consent: I have reviewed the patients History and Physical, chart, labs and discussed the procedure including the risks, benefits and alternatives for the proposed anesthesia with the patient or authorized representative who has indicated his/her understanding and acceptance.     Plan Discussed with:   Anesthesia Plan Comments:        Anesthesia Quick Evaluation

## 2016-11-26 NOTE — Addendum Note (Signed)
Addendum  created 11/26/16 1043 by Despina HiddenIdacavage, Katelynd Blauvelt J, CRNA   Charge Capture section accepted, Sign clinical note

## 2016-11-26 NOTE — Op Note (Addendum)
Patient:  Jacqueline Alvarado  DOB:  10/10/1995  MRN:  [id number]   Preop Diagnosis:  Cicatrix, abdominal wall  Postop Diagnosis:  Same  Procedure:  Excision of cicatrix, abdominal wall, 8 cm  Surgeon:  Franky MachoMark Ahri Olson, M.D.  Anes:  Gen. endotracheal  Indications:  Patient is a 21 year old morbidly obese black female who presents with recurrent cellulitis and granulomatous formation in the upper mid abdominal wall. The patient now presents for excision of the cicatrix. The risks and benefits of the procedure including bleeding, infection, and recurrence of the scarring were fully explained to the patient, who gave informed consent.  Procedure note:  The patient was placed in the supine position. After induction of general endotracheal anesthesia, the abdomen was prepped and draped using the usual sterile technique with DuraPrep. Surgical site confirmation was performed.  An elliptical incision was made around the granulomatous tissue in the upper abdomen. Full-thickness skin and subcutaneous tissue down to the abdominal wall was excised. The total excision amount was 8 x 3 x 5 cm. The was disposed of. No purulent fluid was noted. The wound was irrigated with normal saline. The subcutaneous layer was reapproximated using a 2-0 Vicryl interrupted suture. Exparel was instilled into the surrounding wound. The skin was closed using staples. Betadine ointment and dry sterile dressings were applied.  All tape and needle counts were correct at the end of the procedure. The patient was extubated in the operating room and transferred to PACU in stable condition.  Complications:  None  EBL:  Minimal  Specimen:  Skin and subcutaneous tissue

## 2016-11-26 NOTE — Discharge Instructions (Signed)
May take dressing off in two days and shower.  Keep clean with soap and water.

## 2016-11-26 NOTE — Anesthesia Procedure Notes (Signed)
Procedure Name: Intubation Date/Time: 11/26/2016 9:49 AM Performed by: Charmaine Downs Pre-anesthesia Checklist: Patient identified, Patient being monitored, Timeout performed, Emergency Drugs available and Suction available Patient Re-evaluated:Patient Re-evaluated prior to inductionOxygen Delivery Method: Circle System Utilized Preoxygenation: Pre-oxygenation with 100% oxygen Intubation Type: IV induction, Rapid sequence and Cricoid Pressure applied Ventilation: Mask ventilation without difficulty and Oral airway inserted - appropriate to patient size Laryngoscope Size: Mac and 3 Grade View: Grade II Tube type: Oral Tube size: 7.0 mm Number of attempts: 1 Airway Equipment and Method: stylet Placement Confirmation: ETT inserted through vocal cords under direct vision,  positive ETCO2 and breath sounds checked- equal and bilateral Secured at: 22 cm Tube secured with: Tape Dental Injury: Teeth and Oropharynx as per pre-operative assessment

## 2016-11-26 NOTE — Interval H&P Note (Signed)
History and Physical Interval Note:  11/26/2016 8:56 AM  Jacqueline Alvarado  has presented today for surgery, with the diagnosis of cicatrix of skin  The various methods of treatment have been discussed with the patient and family. After consideration of risks, benefits and other options for treatment, the patient has consented to  Procedure(s) with comments: EXCISION OF ABDOMINAL CICATRIX 4CM (N/A) - p knows to arrive at 7:50 as a surgical intervention .  The patient's history has been reviewed, patient examined, no change in status, stable for surgery.  I have reviewed the patient's chart and labs.  Questions were answered to the patient's satisfaction.     Franky MachoMark Stepen Prins

## 2016-11-26 NOTE — Anesthesia Postprocedure Evaluation (Signed)
Anesthesia Post Note  Patient: Jacqueline Alvarado  Procedure(s) Performed: Procedure(s) (LRB): EXCISION OF ABDOMINAL CICATRIX 4CM (N/A)  Patient location during evaluation: PACU Anesthesia Type: General Level of consciousness: awake and patient cooperative Pain management: pain level controlled Vital Signs Assessment: post-procedure vital signs reviewed and stable Respiratory status: spontaneous breathing, nonlabored ventilation and respiratory function stable Cardiovascular status: blood pressure returned to baseline Postop Assessment: no signs of nausea or vomiting Anesthetic complications: no     Last Vitals:  Vitals:   11/26/16 0801 11/26/16 0804  BP:  (!) 104/59  Pulse: 65   Resp: 18   Temp: 36.8 C     Last Pain:  Vitals:   11/26/16 0801  TempSrc: Oral                 Giovanie Lefebre J

## 2016-11-28 ENCOUNTER — Encounter (HOSPITAL_COMMUNITY): Payer: Self-pay | Admitting: General Surgery

## 2016-12-11 ENCOUNTER — Ambulatory Visit (INDEPENDENT_AMBULATORY_CARE_PROVIDER_SITE_OTHER): Payer: Self-pay | Admitting: General Surgery

## 2016-12-11 ENCOUNTER — Encounter: Payer: Self-pay | Admitting: General Surgery

## 2016-12-11 ENCOUNTER — Ambulatory Visit: Payer: BLUE CROSS/BLUE SHIELD | Admitting: General Surgery

## 2016-12-11 VITALS — BP 164/85 | HR 80 | Temp 98.4°F | Resp 18 | Ht 68.0 in | Wt 338.0 lb

## 2016-12-11 DIAGNOSIS — Z09 Encounter for follow-up examination after completed treatment for conditions other than malignant neoplasm: Secondary | ICD-10-CM

## 2016-12-11 NOTE — Progress Notes (Signed)
Subjective:     Jacqueline Alvarado  Status post excision of cicatrix of the abdominal wall. Doing well. Some yellowish drainage noted from lateral aspect of the wound. No fevers noted. Objective:    BP (!) 164/85   Pulse 80   Temp 98.4 F (36.9 C)   Resp 18   Ht 5\' 8"  (1.727 m)   Wt (!) 338 lb (153.3 kg)   BMI 51.39 kg/m   General:  alert, cooperative and no distress  Abdomen soft, incision healing well. Small amount of serous drainage from one of the staple sites. Staples removed, Steri-Strips applied. No erythema or induration noted.     Assessment:    Doing well postoperatively.    Plan:  Keep wound clean and dry. May return to work on 12/15/2016 without restrictions.

## 2017-01-16 ENCOUNTER — Other Ambulatory Visit (HOSPITAL_COMMUNITY): Payer: Self-pay | Admitting: Family Medicine

## 2017-01-16 ENCOUNTER — Ambulatory Visit (HOSPITAL_COMMUNITY)
Admission: RE | Admit: 2017-01-16 | Discharge: 2017-01-16 | Disposition: A | Discharge status: Home / Self Care | Payer: BLUE CROSS/BLUE SHIELD | Source: Ambulatory Visit | Attending: Family Medicine | Admitting: Family Medicine

## 2017-01-16 DIAGNOSIS — M79672 Pain in left foot: Secondary | ICD-10-CM

## 2017-01-28 ENCOUNTER — Encounter: Payer: Self-pay | Admitting: Gastroenterology

## 2017-01-29 ENCOUNTER — Emergency Department (HOSPITAL_COMMUNITY): Payer: BLUE CROSS/BLUE SHIELD

## 2017-01-29 ENCOUNTER — Inpatient Hospital Stay (HOSPITAL_COMMUNITY)
Admission: EM | Admit: 2017-01-29 | Discharge: 2017-02-03 | DRG: 418 | Disposition: A | Discharge status: Home / Self Care | Payer: BLUE CROSS/BLUE SHIELD | Attending: General Surgery | Admitting: General Surgery

## 2017-01-29 ENCOUNTER — Encounter (HOSPITAL_COMMUNITY): Payer: Self-pay | Admitting: Emergency Medicine

## 2017-01-29 DIAGNOSIS — K279 Peptic ulcer, site unspecified, unspecified as acute or chronic, without hemorrhage or perforation: Secondary | ICD-10-CM | POA: Diagnosis present

## 2017-01-29 DIAGNOSIS — R109 Unspecified abdominal pain: Secondary | ICD-10-CM | POA: Insufficient documentation

## 2017-01-29 DIAGNOSIS — N135 Crossing vessel and stricture of ureter without hydronephrosis: Secondary | ICD-10-CM | POA: Insufficient documentation

## 2017-01-29 DIAGNOSIS — Z6841 Body Mass Index (BMI) 40.0 and over, adult: Secondary | ICD-10-CM

## 2017-01-29 DIAGNOSIS — K8000 Calculus of gallbladder with acute cholecystitis without obstruction: Secondary | ICD-10-CM | POA: Diagnosis not present

## 2017-01-29 DIAGNOSIS — R1011 Right upper quadrant pain: Secondary | ICD-10-CM | POA: Diagnosis not present

## 2017-01-29 DIAGNOSIS — K8050 Calculus of bile duct without cholangitis or cholecystitis without obstruction: Secondary | ICD-10-CM

## 2017-01-29 DIAGNOSIS — Z79899 Other long term (current) drug therapy: Secondary | ICD-10-CM

## 2017-01-29 DIAGNOSIS — K8020 Calculus of gallbladder without cholecystitis without obstruction: Secondary | ICD-10-CM | POA: Diagnosis not present

## 2017-01-29 DIAGNOSIS — K810 Acute cholecystitis: Secondary | ICD-10-CM | POA: Diagnosis present

## 2017-01-29 DIAGNOSIS — Z87891 Personal history of nicotine dependence: Secondary | ICD-10-CM

## 2017-01-29 DIAGNOSIS — K821 Hydrops of gallbladder: Secondary | ICD-10-CM | POA: Diagnosis present

## 2017-01-29 DIAGNOSIS — K625 Hemorrhage of anus and rectum: Secondary | ICD-10-CM | POA: Diagnosis present

## 2017-01-29 HISTORY — DX: Other chronic pain: G89.29

## 2017-01-29 HISTORY — DX: Right upper quadrant pain: R10.11

## 2017-01-29 LAB — URINALYSIS, ROUTINE W REFLEX MICROSCOPIC
Bilirubin Urine: NEGATIVE
Glucose, UA: NEGATIVE mg/dL
Hgb urine dipstick: NEGATIVE
KETONES UR: NEGATIVE mg/dL
LEUKOCYTES UA: NEGATIVE
NITRITE: NEGATIVE
PROTEIN: NEGATIVE mg/dL
Specific Gravity, Urine: 1.015 (ref 1.005–1.030)
pH: 8 (ref 5.0–8.0)

## 2017-01-29 LAB — COMPREHENSIVE METABOLIC PANEL
ALBUMIN: 3.9 g/dL (ref 3.5–5.0)
ALK PHOS: 36 U/L — AB (ref 38–126)
ALT: 21 U/L (ref 14–54)
ANION GAP: 11 (ref 5–15)
AST: 26 U/L (ref 15–41)
BILIRUBIN TOTAL: 0.9 mg/dL (ref 0.3–1.2)
BUN: 10 mg/dL (ref 6–20)
CALCIUM: 9 mg/dL (ref 8.9–10.3)
CO2: 21 mmol/L — ABNORMAL LOW (ref 22–32)
Chloride: 103 mmol/L (ref 101–111)
Creatinine, Ser: 0.71 mg/dL (ref 0.44–1.00)
GFR calc non Af Amer: >60 mL/min (ref >60)
GLUCOSE: 96 mg/dL (ref 65–99)
POTASSIUM: 3.8 mmol/L (ref 3.5–5.1)
SODIUM: 135 mmol/L (ref 135–145)
TOTAL PROTEIN: 7.7 g/dL (ref 6.5–8.1)

## 2017-01-29 LAB — CBC WITH DIFFERENTIAL/PLATELET
BASOS ABS: 0 10*3/uL (ref 0.0–0.1)
BASOS PCT: 0 %
Eosinophils Absolute: 0.2 10*3/uL (ref 0.0–0.7)
Eosinophils Relative: 1 %
HEMATOCRIT: 40.3 % (ref 36.0–46.0)
HEMOGLOBIN: 13.1 g/dL (ref 12.0–15.0)
LYMPHS PCT: 24 %
Lymphs Abs: 3.4 10*3/uL (ref 0.7–4.0)
MCH: 23 pg — ABNORMAL LOW (ref 26.0–34.0)
MCHC: 32.5 g/dL (ref 30.0–36.0)
MCV: 70.7 fL — AB (ref 78.0–100.0)
MONO ABS: 1.3 10*3/uL — AB (ref 0.1–1.0)
Monocytes Relative: 9 %
NEUTROS ABS: 9.3 10*3/uL — AB (ref 1.7–7.7)
NEUTROS PCT: 66 %
Platelets: 281 10*3/uL (ref 150–400)
RBC: 5.7 MIL/uL — AB (ref 3.87–5.11)
RDW: 15.7 % — AB (ref 11.5–15.5)
WBC: 14.1 10*3/uL — AB (ref 4.0–10.5)

## 2017-01-29 LAB — LIPASE, BLOOD: Lipase: 20 U/L (ref 11–51)

## 2017-01-29 LAB — PREGNANCY, URINE: Preg Test, Ur: NEGATIVE

## 2017-01-29 MED ORDER — IOPAMIDOL (ISOVUE-300) INJECTION 61%
125.0000 mL | Freq: Once | INTRAVENOUS | Status: AC | PRN
  Administered 2017-01-29: 125 mL via INTRAVENOUS

## 2017-01-29 MED ORDER — ACETAMINOPHEN 650 MG RE SUPP: 650.0000 mg | Freq: Four times a day (QID) | RECTAL | Status: DC | PRN

## 2017-01-29 MED ORDER — ESCITALOPRAM OXALATE 10 MG PO TABS
20.0000 mg | ORAL_TABLET | Freq: Every day | ORAL | Status: DC
  Administered 2017-01-31 – 2017-02-03 (×3): 20 mg via ORAL
  Filled 2017-01-29 (×4): qty 2

## 2017-01-29 MED ORDER — MORPHINE SULFATE (PF) 4 MG/ML IV SOLN
4.0000 mg | INTRAVENOUS | Status: AC | PRN
  Administered 2017-01-29 (×2): 4 mg via INTRAVENOUS
  Filled 2017-01-29 (×2): qty 1

## 2017-01-29 MED ORDER — ONDANSETRON HCL 4 MG/2ML IJ SOLN
4.0000 mg | Freq: Four times a day (QID) | INTRAMUSCULAR | Status: DC | PRN
  Administered 2017-01-31 – 2017-02-01 (×5): 4 mg via INTRAVENOUS
  Filled 2017-01-29 (×5): qty 2

## 2017-01-29 MED ORDER — DICYCLOMINE HCL 10 MG/ML IM SOLN
20.0000 mg | Freq: Once | INTRAMUSCULAR | Status: AC
  Administered 2017-01-29: 20 mg via INTRAMUSCULAR
  Filled 2017-01-29: qty 2

## 2017-01-29 MED ORDER — MORPHINE SULFATE (PF) 2 MG/ML IV SOLN
2.0000 mg | INTRAVENOUS | Status: DC | PRN
  Administered 2017-01-29 – 2017-01-30 (×7): 2 mg via INTRAVENOUS
  Filled 2017-01-29 (×7): qty 1

## 2017-01-29 MED ORDER — ONDANSETRON HCL 4 MG PO TABS: 4.0000 mg | ORAL_TABLET | Freq: Four times a day (QID) | ORAL | Status: DC | PRN

## 2017-01-29 MED ORDER — NORGESTIMATE-ETH ESTRADIOL 0.25-35 MG-MCG PO TABS: 1.0000 | ORAL_TABLET | Freq: Every day | ORAL | Status: DC

## 2017-01-29 MED ORDER — SODIUM CHLORIDE 0.9 % IV SOLN
INTRAVENOUS | Status: DC
  Administered 2017-01-29 – 2017-02-02 (×8): via INTRAVENOUS

## 2017-01-29 MED ORDER — FENTANYL CITRATE (PF) 100 MCG/2ML IJ SOLN
50.0000 ug | INTRAMUSCULAR | Status: AC | PRN
  Administered 2017-01-29 (×2): 50 ug via INTRAVENOUS
  Filled 2017-01-29 (×2): qty 2

## 2017-01-29 MED ORDER — ONDANSETRON HCL 4 MG/2ML IJ SOLN
4.0000 mg | INTRAMUSCULAR | Status: AC | PRN
  Administered 2017-01-29 (×2): 4 mg via INTRAVENOUS
  Filled 2017-01-29 (×2): qty 2

## 2017-01-29 MED ORDER — ACETAMINOPHEN 325 MG PO TABS: 650.0000 mg | ORAL_TABLET | Freq: Four times a day (QID) | ORAL | Status: DC | PRN

## 2017-01-29 MED ORDER — CLONAZEPAM 0.5 MG PO TABS
1.0000 mg | ORAL_TABLET | Freq: Two times a day (BID) | ORAL | Status: DC | PRN
  Administered 2017-01-29 – 2017-02-03 (×7): 1 mg via ORAL
  Filled 2017-01-29 (×7): qty 2

## 2017-01-29 NOTE — H&P (Signed)
History and Physical    Jacqueline Alvarado ZOX:[id number] DOB: 12/10/1995 DOA: 01/29/2017  PCP: Gareth MorganKnowlton, Steve, MD Consultants:  Scheduled to see Dr. Darrick PennaFields Aug 30, has seen her once in the past Patient coming from: Home - lives with parents, sister, and niece' NOK: parents, [phone]  Chief Complaint: RUQ abdominal pain  HPI: Jacqueline FeltDeja S Ellis is a 21 y.o. female with medical history significant of depression/anxiety, headaches, and chronic RUQ pain presenting with worsening pain.  1 year+ history of abdominal pain in the RUQ, worsening since last July.  Intermittent at first but has become more persistent as well as more painful.  Had an US, CT, colonoscopy all without conclusive findings.  When the pain is present, she also notices distention.  The last 4 days, it has been nonstop,associated with back pain radiation, N/V.  BMs have been watery for the last 6-8 months, never solid.  No stools the last 2 days, the day before she had very loose stools.  Her BMs generally occur right after eating.  Low-grade fever to 99.9 last night.  No sick contacts, no dietary indiscretion that may have led to this.  Feels like there is a small balloon in her stomach.  With deep breaths or moving around it is very hard to get comfortable.  Chronic headaches.  Max weight 358, currently 339 with dieting.  She doesn't always eat like she should due to the pain.  She has tried Tylenol, Ibuprofen, hydrocodone, PPI - all without relief.  Small peptic ulcer in lower intestine by colonoscopy, per patient report; she was placed on protonix.  Abdominal US in 8/16 was normal.  HIDA scan in 6/17 was a normal hepatobiliary scan with normal gallbladder EF.  Colonoscopy report from 01/07/16 showed rectal bleeding from internal hemorrhoids s/p banding; and multiple ulcers in the distal ileum; biopsies showed acute ileitis.  ED Course: CT with new gallstones.  Dr. Jena Gaussourk and Dr. Lovell SheehanJenkins consulted to see patient.  CT also with retroperitoneal  fat stranding and apparent inflammation.  Dr. Janyth ContesZhou reviewed CT, thinks unlikely lymphoma, will f/u as an outpatient.  Review of Systems: As per HPI; otherwise review of systems reviewed and negative.   Ambulatory Status:  Ambulates without assistance  Past Medical History:  Diagnosis Date  . Abdominal pain, chronic, right upper quadrant   . Abnormal Pap smear of cervix   . Anemia    iron deficiency  . Anxiety   . Depression   . Headache(784.0)   . PONV (postoperative nausea and vomiting)    Hx: of nausea only at age 21    Past Surgical History:  Procedure Laterality Date  . COLONOSCOPY N/A 01/07/2016   Procedure: COLONOSCOPY;  Surgeon: West BaliSandi L Fields, MD;  Location: [facility];  Service: Endoscopy;  Laterality: N/A;  12:15  . HEMORRHOID BANDING N/A 01/07/2016   Procedure: HEMORRHOID BANDING;  Surgeon: West BaliSandi L Fields, MD;  Location: [facility];  Service: Endoscopy;  Laterality: N/A;  . MASS EXCISION N/A 11/26/2016   Procedure: EXCISION OF ABDOMINAL CICATRIX 4CM;  Surgeon: Franky MachoJenkins, Mark, MD;  Location: [facility];  Service: General;  Laterality: N/A;  p knows to arrive at 7:50  . NASAL SEPTOPLASTY W/ TURBINOPLASTY Bilateral 06/24/2013   Procedure: TURBINATE REDUCTION;  Surgeon: Flo ShanksKarol Wolicki, MD;  [facility];  Service: ENT;  Laterality: Bilateral;  . TONSILLECTOMY AND ADENOIDECTOMY Bilateral 06/24/2013   Procedure: TONSILLECTOMY ;  Surgeon: Flo ShanksKarol Wolicki, MD;  [facility];  Service: ENT;  Laterality: Bilateral;  .  TUMOR EXCISION     Hx: of right side of neck  . WISDOM TOOTH EXTRACTION      Social History   Social History  . Marital status: Single    Spouse name: N/A  . Number of children: N/A  . 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 Smoking status: Former Smoker    Years: 1.00    Types: Cigarettes    Quit date: 10/22/2016  . Smokeless tobacco: Never Used     Comment: smoked only  1-2 cig weekly for 1 year  . Alcohol use 0.0 oz/week     Comment: occasional  . Drug use: Yes    Frequency: 7.0 times per week    Types: Marijuana     Comment: 3x/week  . Sexual activity: Yes    Birth control/ protection: None, Condom   Other Topics Concern  . Not on file   Social History Narrative  . No narrative on file    No Known Allergies  Family History  Problem Relation Age of Onset  . Cancer - Other Mother   . Arthritis Mother   . Hypertension Father   . COPD Father   . Cancer - Cervical Other   . Diabetes Other   . Stroke Other     Prior to Admission medications   Medication Sig Start Date End Date Taking? Authorizing Provider  acetaminophen (TYLENOL) 500 MG tablet Take 1,000 mg by mouth every 6 (six) hours as needed for mild pain or headache.   Yes [provider]  clonazePAM (KLONOPIN) 1 MG tablet Take 1 mg by mouth 2 (two) times daily as needed for anxiety.  10/01/16  Yes [provider]  escitalopram (LEXAPRO) 20 MG tablet Take 20 mg by mouth daily.  10/01/16  Yes [provider]  HYDROcodone-acetaminophen (NORCO) 5-325 MG tablet Take 1-2 tablets by mouth every 4 (four) hours as needed for moderate pain. 11/26/16  Yes Franky Macho, MD  ibuprofen (ADVIL,MOTRIN) 200 MG tablet Take 400-600 mg by mouth every 6 (six) hours as needed for headache or mild pain.   Yes [provider]  norgestimate-ethinyl estradiol (MONONESSA) 0.25-35 MG-MCG tablet Take 1 tablet by mouth daily.   Yes [provider]  pantoprazole (PROTONIX) 40 MG tablet Take 1 tablet by mouth daily. 01/23/17  Yes [provider]  ondansetron (ZOFRAN) 4 MG tablet Take 1 tablet (4 mg total) by mouth every 8 (eight) hours as needed. Patient not taking: Reported on 01/29/2017 11/26/16   Franky Macho, MD    Physical Exam: Vitals:   01/29/17 1400 01/29/17 1430 01/29/17 1500 01/29/17 1530  BP: (!) 143/92 131/63 128/84 137/87  Pulse: 68 (!) 47 (!) 55 (!) 54   Resp:      Temp:      TempSrc:      SpO2: 100% 100% 100% 99%  Weight:      Height:         General:  Appears calm and comfortable and is NAD, super-morbidly bese Eyes:  PERRL, EOMI, normal lids, iris ENT:  grossly normal hearing, lips & tongue, mmm Neck:  no LAD, masses or thyromegaly Cardiovascular:  RRR, no m/r/g. No LE edema.  Respiratory:  CTA bilaterally, no w/r/r. Normal respiratory effort. Abdomen:  soft, ntnd, NABS; abdominal exam is quite limited due to her pannus Skin:  no rash or induration seen on limited exam Musculoskeletal:  grossly normal tone BUE/BLE, good ROM, no bony abnormality Psychiatric:  grossly normal mood and affect, speech fluent and appropriate, AOx3 Neurologic:  CN 2-12 grossly intact, moves all extremities in coordinated fashion, sensation intact  Labs on Admission: I have personally reviewed following labs and imaging studies  CBC:  Recent Labs Lab 01/29/17 1124  WBC 14.1*  NEUTROABS 9.3*  HGB 13.1  HCT 40.3  MCV 70.7*  PLT 281   Basic Metabolic Panel:  Recent Labs Lab 01/29/17 1124  NA 135  K 3.8  CL 103  CO2 21*  GLUCOSE 96  BUN 10  CREATININE 0.71  CALCIUM 9.0   GFR: Estimated Creatinine Clearance: 173.7 mL/min (by C-G formula based on SCr of 0.71 mg/dL). Liver Function Tests:  Recent Labs Lab 01/29/17 1124  AST 26  ALT 21  ALKPHOS 36*  BILITOT 0.9  PROT 7.7  ALBUMIN 3.9    Recent Labs Lab 01/29/17 1124  LIPASE 20   No results for input(s): AMMONIA in the last 168 hours. Coagulation Profile: No results for input(s): INR, PROTIME in the last 168 hours. Cardiac Enzymes: No results for input(s): CKTOTAL, CKMB, CKMBINDEX, TROPONINI in the last 168 hours. BNP (last 3 results) No results for input(s): PROBNP in the last 8760 hours. HbA1C: No results for input(s): HGBA1C in the last 72 hours. CBG: No results for input(s): GLUCAP in the last 168 hours. Lipid Profile: No results for input(s): CHOL, HDL,  LDLCALC, TRIG, CHOLHDL, LDLDIRECT in the last 72 hours. Thyroid Function Tests: No results for input(s): TSH, T4TOTAL, FREET4, T3FREE, THYROIDAB in the last 72 hours. Anemia Panel: No results for input(s): VITAMINB12, FOLATE, FERRITIN, TIBC, IRON, RETICCTPCT in the last 72 hours. Urine analysis:    Component Value Date/Time   COLORURINE YELLOW 01/29/2017 1051   APPEARANCEUR CLEAR 01/29/2017 1051   LABSPEC 1.015 01/29/2017 1051   PHURINE 8.0 01/29/2017 1051   GLUCOSEU NEGATIVE 01/29/2017 1051   HGBUR NEGATIVE 01/29/2017 1051   BILIRUBINUR NEGATIVE 01/29/2017 1051   KETONESUR NEGATIVE 01/29/2017 1051   PROTEINUR NEGATIVE 01/29/2017 1051   NITRITE NEGATIVE 01/29/2017 1051   LEUKOCYTESUR NEGATIVE 01/29/2017 1051    Creatinine Clearance: Estimated Creatinine Clearance: 173.7 mL/min (by C-G formula based on SCr of 0.71 mg/dL).  Sepsis Labs: @LABRCNTIP (procalcitonin:4,lacticidven:4) )No results found for this or any previous visit (from the past 240 hour(s)).   Radiological Exams on Admission: Ct Abdomen Pelvis W Contrast  Addendum Date: 01/29/2017   ADDENDUM REPORT: 01/29/2017 14:10 ADDENDUM: Prior chest CT from October 17, 2005 does not extend inferiorly through the area of mesenteric thickening. No mesenteric inflammation is noted at that time along the superior aspect of the area of current inflammation. Mild prominence of a retroperitoneal lymph node on the left seen on the 2007 study appears essentially stable at this time. Other mildly prominent lymph nodes are seen slightly inferior to the caudal extent of imaging from the 2007 study. Electronically Signed   By: Bretta Bang III M.D.   On: 01/29/2017 14:10   Result Date: 01/29/2017 CLINICAL DATA:  Right upper quadrant pain, chronic EXAM: CT ABDOMEN AND PELVIS WITH CONTRAST TECHNIQUE: Multidetector CT imaging of the abdomen and pelvis was performed using the standard protocol following bolus administration of intravenous contrast.  CONTRAST:  ISOVUE-300 IOPAMIDOL (ISOVUE-300) INJECTION 61% COMPARISON:  None. FINDINGS: Lower chest: There is mild atelectatic change in superior segment of the right low there are areas of apparent scarring in the anterior abdominal wall. There is a small Er lobe.  Visualized lung bases are otherwise clear. Hepatobiliary: Liver measures 20.0 cm in length. No focal liver lesions are evident. Gallbladder is somewhat distended with cholelithiasis in the dependent portion. No gallbladder wall thickening. No biliary duct dilatation. Pancreas: No pancreatic mass or inflammatory focus. Spleen: No splenic lesions evident. Adrenals/Urinary Tract: Adrenals appear normal bilaterally. Kidneys bilaterally show no evident mass or hydronephrosis on either side. No renal or ureteral calculus evident on either side. Urinary bladder is midline with wall thickness within normal limits. Stomach/Bowel: There is no appreciable bowel wall or mesenteric thickening. No bowel obstruction. No free air or portal venous air. Vascular/Lymphatic: There is no abdominal aortic aneurysm. Major mesenteric arterial vessels appear patent. There is thickening of the mesenteric in the retroperitoneum at the level of the inferior vena cava inferior to the liver with thickening of the retroperitoneum surrounding the inferior vena cava and extending to the level of the aorta at the mid kidney level. Several small lymph nodes are noted in this area. The inferior vena cava does not appear thrombosed. Beyond the lymph nodes seen in the areas of mesenteric thickening in the retroperitoneum at the level of the inferior vena cava, there is no adenopathy appreciable in the abdomen or pelvis. Reproductive: Uterus is anteverted. No pelvic mass or pelvic fluid collection. Other: Appendix appears unremarkable. No abscess or ascites evident in the abdomen or pelvis. There is a small ventral hernia containing only fat. There are areas of scarring in the anterior  abdominal wall. No abdominal wall fluid collection evident. Musculoskeletal: There are no blastic or lytic bone lesions. No intramuscular lesions are evident. IMPRESSION: 1. Mesenteric and fat thickening and stranding in the retroperitoneum centered at the level of the infrahepatic inferior vena cava but extending posteriorly and to the left to surround a portion of the aorta. Inferiorly, inflammation terminates at the approximate level of the junction of the common femoral veins with the inferior vena cava. There is no thickening in either perinephric fascia region. There are several mildly prominent lymph nodes in this area. 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 It should be noted that atypical lymphoma could present in this manner. A retroperitoneal sarcoma, while rare, could also present in this manner. There is no appreciable thrombus in the inferior vena cava. Inflammation does not appear to arise from the inferior vena cava itself. It may be reasonable to consider nuclear medicine PET study to assess degree of metabolic activity in this area. 2. Cholelithiasis. Gallbladder mildly distended. No gallbladder wall thickening. 3.  No bowel obstruction.  No abscess.  Appendix appears normal. 4.  Scarring anterior abdominal wall.  No fluid in this area. Electronically Signed: By: Bretta Bang III M.D. On: 01/29/2017 13:53    EKG: Not done  Assessment/Plan Principal Problem:   RUQ pain Active Problems:   Morbid obesity (HCC)    Pertinent labs: Normal LFTs WBC 14.1 Negative pregnancy test Negative UA  -Patient with long-standing (>1 year) h/o colicky abdominal pain with loose stools shortly after eating -She fits the demographics for gallbladder disease (although she is younger) -Previous evaluation did not demonstrate gallbladder disease but current imaging does show cholelithiasis without frank cholecystitis  or LFT elevation -Will observe and obtain HIDA scan -If the HIDA scan is abnormal, which seems likely at this point, the patient would likely benefit from cholecystectomy to resolve her symptoms. -GI consulted by the ER, Dr. Jena Gauss to see. -Surgery consulted by the ER,  Dr. Lovell Sheehan to see patient. -NPO after midnight for HIDA and/or cholecystectomy. -The procedure would like be complicated by her morbid obesity and so inpatient procedure is likely a better option for her.  Of note, patient also with non-specific retroperitoneal findings including possible LAD.  This was discussed with Dr. Janyth Contes, who recommends outpatient f/u.  This is likely incidental to the patient's current presentation.  DVT prophylaxis:  SCDs Code Status: Full  Family Communication: Mother present for most of the evaluation  Disposition Plan:  Home once clinically improved Consults called: GI, surgery  Admission status: It is my clinical opinion that referral for OBSERVATION is reasonable and necessary in this patient based on the above information provided. The aforementioned taken together are felt to place the patient at high risk for further clinical deterioration. However it is anticipated that the patient may be medically stable for discharge from the hospital within 24 to 48 hours.   Jonah Blue MD [facility]  If 7PM-7AM, please contact night-coverage [email or web address] Password [facility]  01/29/2017, 4:33 PM

## 2017-01-29 NOTE — ED Notes (Signed)
Pt requesting more pain mediaction

## 2017-01-29 NOTE — ED Notes (Signed)
Dr Kendell Baneourke in to see pt

## 2017-01-29 NOTE — Consult Note (Signed)
Referring Provider: Jonah Blue, MD Primary Care Physician:  Gareth Morgan, MD Primary Gastroenterologist:  Jonette Eva, MD  Reason for Consultation:  ruq pain  HPI: Jacqueline Alvarado is a 21 y.o. female presenting to the ED with 4 days of unrelenting right upper quadrant pain associated with nausea and vomiting. Patient states she's had similar symptoms off and on for quite some time, more persistent the last 3-4 weeks. She's had daily symptoms of right upper quadrant pain but would seem to go away during the day and come back in the evening. Worse with meals. Vomiting just recently started. Feels bloated. Pain radiating into the right back. Denies heartburn. Has had some belching. For the last 6 day months she's had persistent loose stools/diarrhea. 4 episodes daily. Single episode of rectal bleeding with it. No nocturnal diarrhea. She denies NSAID and aspirin use given history of terminal ileal ulcers on colonoscopy in 2017 felt to be related to NSAIDs. She denies melena.  In the ER her white blood cell count 14,100. LFTs unremarkable. Lipase normal. Pregnancy test negative. Urinalysis unremarkable.  CT abdomen and pelvis with contrast performed today showed somewhat distended gallbladder with cholelithiasis in the dependent portion, no gallbladder wall thickening or biliary obstruction. Pancreas appeared normal. She has mesenteric and fat thickening and stranding in the retroperitoneum centered at the level of the infraheptic inferior vena cava but extending posteriorly and to the left to surround a portion of the aorta. Inferiorly inflammation terminates at the approximate level of the junction of the common femoral veins with the inferior vena cava. Several mildly prominent lymph nodes in this area. 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 When compared to chest CT from April 2007 images did not extend inferiorly  through the area of mesenteric thickening. There was no mesenteric inflammation noted along the superior aspect of the area of current inflammation. There was mild prominence of retroperitoneal lymph nodes on the left seen in the 2007 study essentially stable. study  Patient recently had excision of abdominal cicatrix 11/26/16.    June 2017 HIDA scan was normal. Abdominal ultrasound August 2016 unremarkable.  Prior to Admission medications   Medication Sig Start Date End Date Taking? Authorizing Provider  acetaminophen (TYLENOL) 500 MG tablet Take 1,000 mg by mouth every 6 (six) hours as needed for mild pain or headache.   Yes [provider]  clonazePAM (KLONOPIN) 1 MG tablet Take 1 mg by mouth 2 (two) times daily as needed for anxiety.  10/01/16  Yes [provider]  escitalopram (LEXAPRO) 20 MG tablet Take 20 mg by mouth daily.  10/01/16  Yes [provider]  HYDROcodone-acetaminophen (NORCO) 5-325 MG tablet Take 1-2 tablets by mouth every 4 (four) hours as needed for moderate pain. 11/26/16  Yes Franky Macho, MD  ibuprofen (ADVIL,MOTRIN) 200 MG tablet Take 400-600 mg by mouth every 6 (six) hours as needed for headache or mild pain.   Yes [provider]  norgestimate-ethinyl estradiol (MONONESSA) 0.25-35 MG-MCG tablet Take 1 tablet by mouth daily.   Yes [provider]  pantoprazole (PROTONIX) 40 MG tablet Take 1 tablet by mouth daily. 01/23/17  Yes [provider]  ondansetron (ZOFRAN) 4 MG tablet Take 1 tablet (4 mg total) by mouth every 8 (eight) hours as needed. Patient not taking: Reported on 01/29/2017 11/26/16   Franky Macho, MD    No current facility-administered medications for this encounter.    Current Outpatient Prescriptions  Medication Sig  Dispense Refill  . acetaminophen (TYLENOL) 500 MG tablet Take 1,000 mg by mouth every 6 (six) hours as needed for mild pain or headache.    . clonazePAM (KLONOPIN) 1 MG tablet Take 1 mg by  mouth 2 (two) times daily as needed for anxiety.     Marland Kitchen escitalopram (LEXAPRO) 20 MG tablet Take 20 mg by mouth daily.     Marland Kitchen HYDROcodone-acetaminophen (NORCO) 5-325 MG tablet Take 1-2 tablets by mouth every 4 (four) hours as needed for moderate pain. 40 tablet 0  . ibuprofen (ADVIL,MOTRIN) 200 MG tablet Take 400-600 mg by mouth every 6 (six) hours as needed for headache or mild pain.    . norgestimate-ethinyl estradiol (MONONESSA) 0.25-35 MG-MCG tablet Take 1 tablet by mouth daily.    . pantoprazole (PROTONIX) 40 MG tablet Take 1 tablet by mouth daily.  0  . ondansetron (ZOFRAN) 4 MG tablet Take 1 tablet (4 mg total) by mouth every 8 (eight) hours as needed. (Patient not taking: Reported on 01/29/2017) 20 tablet 0    Allergies as of 01/29/2017  . (No Known Allergies)    Past Medical History:  Diagnosis Date  . Abdominal pain, chronic, right upper quadrant   . Abnormal Pap smear of cervix   . Anemia   . Anxiety   . Depression   . Headache(784.0)   . Low iron    Hx: of  . PONV (postoperative nausea and vomiting)    Hx: of nausea only at age 56    Past Surgical History:  Procedure Laterality Date  . COLONOSCOPY N/A 01/07/2016   Dr. Darrick Penna: Nonbleeding internal hemorrhoids, large. 3 bands successfully placed. Distal ileum containing multiple ulcers which she felt was related to NSAIDs. Biopsies nonspecific.  Marland Kitchen HEMORRHOID BANDING N/A 01/07/2016   Procedure: HEMORRHOID BANDING;  Surgeon: West Bali, MD;  Location: [facility];  Service: Endoscopy;  Laterality: N/A;  . MASS EXCISION N/A 11/26/2016   Procedure: EXCISION OF ABDOMINAL CICATRIX 4CM;  Surgeon: Franky Macho, MD;  Location: [facility];  Service: General;  Laterality: N/A;  p knows to arrive at 7:50  . NASAL SEPTOPLASTY W/ TURBINOPLASTY Bilateral 06/24/2013   Procedure: TURBINATE REDUCTION;  Surgeon: Flo Shanks, MD;  Location: [facility] OR;  Service: ENT;  Laterality: Bilateral;  . TONSILLECTOMY AND ADENOIDECTOMY Bilateral 06/24/2013    Procedure: TONSILLECTOMY ;  Surgeon: Flo Shanks, MD;  Location: [facility] OR;  Service: ENT;  Laterality: Bilateral;  . TUMOR EXCISION     Hx: of right side of neck  . WISDOM TOOTH EXTRACTION      Family History  Problem Relation Age of Onset  . Cancer - Other Mother   . Arthritis Mother   . Hypertension Father   . COPD Father   . Cancer - Cervical Other   . Diabetes Other   . Stroke Other     Social History   Social History  . Marital status: Single    Spouse name: N/A  . Number of children: N/A  . Years of education: N/A   Occupational History  . Not on file.   Social History Main Topics  . Smoking status: Former Smoker    Years: 1.00    Types: Cigarettes    Quit date: 10/22/2016  . Smokeless tobacco: Never Used     Comment: smoked only 1-2 cig weekly for 1 year  . Alcohol use 0.0 oz/week     Comment: occasional  . Drug use: Yes    Frequency: 7.0 times  per week    Types: Marijuana     Comment: occasional   . Sexual activity: Yes    Birth control/ protection: None, Condom   Other Topics Concern  . Not on file   Social History Narrative  . No narrative on file     ROS:  General: Negative for anorexia, weight loss, fever, chills, fatigue, weakness. Eyes: Negative for vision changes.  ENT: Negative for hoarseness, difficulty swallowing , nasal congestion. CV: Negative for chest pain, angina, palpitations, dyspnea on exertion, peripheral edema.  Respiratory: Negative for dyspnea at rest, dyspnea on exertion, cough, sputum, wheezing.  GI: See history of present illness. GU:  Negative for dysuria, hematuria, urinary incontinence, urinary frequency, nocturnal urination.  MS: Negative for joint pain, low back pain.  Derm: Negative for rash or itching.  Neuro: Negative for weakness, abnormal sensation, seizure, frequent headaches, memory loss, confusion.  Psych: Negative for anxiety, depression, suicidal ideation, hallucinations.  Endo: Negative for unusual  weight change.  Heme: Negative for bruising or bleeding. Allergy: Negative for rash or hives.       Physical Examination: Vital signs in last 24 hours: Temp:  [98.5 F (36.9 C)] 98.5 F (36.9 C) (07/19 1050) Pulse Rate:  [47-68] 55 (07/19 1500) Resp:  [17-20] 20 (07/19 1254) BP: (128-161)/(63-92) 128/84 (07/19 1500) SpO2:  [100 %] 100 % (07/19 1500) Weight:  [339 lb (153.8 kg)] 339 lb (153.8 kg) (07/19 1047)    General: Well-nourished, well-developed. Appears to be in moderate pain. Mother at bedside.  Head: Normocephalic, atraumatic.   Eyes: Conjunctiva pink, no icterus. Mouth: Oropharyngeal mucosa moist and pink , no lesions erythema or exudate. Neck: Supple without thyromegaly, masses, or lymphadenopathy.  Lungs: Clear to auscultation bilaterally.  Heart: Regular rate and rhythm, no murmurs rubs or gallops.  Abdomen: Bowel sounds are normal, morbidly obese, moderate right upper quadrant tenderness to deep palpation, no hepatosplenomegaly or masses, no abdominal bruits or    hernia , no rebound or guarding.   Rectal: Not performed Extremities: No lower extremity edema, clubbing, deformity.  Neuro: Alert and oriented x 4 , grossly normal neurologically.  Skin: Warm and dry, no rash or jaundice.   Psych: Alert and cooperative, normal mood and affect.        Intake/Output from previous day: No intake/output data recorded. Intake/Output this shift: No intake/output data recorded.  Lab Results: CBC  Recent Labs  01/29/17 1124  WBC 14.1*  HGB 13.1  HCT 40.3  MCV 70.7*  PLT 281   BMET  Recent Labs  01/29/17 1124  NA 135  K 3.8  CL 103  CO2 21*  GLUCOSE 96  BUN 10  CREATININE 0.71  CALCIUM 9.0   LFT  Recent Labs  01/29/17 1124  BILITOT 0.9  ALKPHOS 36*  AST 26  ALT 21  PROT 7.7  ALBUMIN 3.9    Lipase  Recent Labs  01/29/17 1124  LIPASE 20    PT/INR No results for input(s): LABPROT, INR in the last 72 hours.    Imaging Studies: Ct  Abdomen Pelvis W Contrast  Addendum Date: 01/29/2017   ADDENDUM REPORT: 01/29/2017 14:10 ADDENDUM: Prior chest CT from October 17, 2005 does not extend inferiorly through the area of mesenteric thickening. No mesenteric inflammation is noted at that time along the superior aspect of the area of current inflammation. Mild prominence of a retroperitoneal lymph node on the left seen on the 2007 study appears essentially stable at this time. Other mildly prominent lymph  nodes are seen slightly inferior to the caudal extent of imaging from the 2007 study. Electronically Signed   By: Bretta Bang III M.D.   On: 01/29/2017 14:10   Result Date: 01/29/2017 CLINICAL DATA:  Right upper quadrant pain, chronic EXAM: CT ABDOMEN AND PELVIS WITH CONTRAST TECHNIQUE: Multidetector CT imaging of the abdomen and pelvis was performed using the standard protocol following bolus administration of intravenous contrast. CONTRAST:  ISOVUE-300 IOPAMIDOL (ISOVUE-300) INJECTION 61% COMPARISON:  None. FINDINGS: Lower chest: There is mild atelectatic change in superior segment of the right low there are areas of apparent scarring in the anterior abdominal wall. There is a small Er lobe. Visualized lung bases are otherwise clear. Hepatobiliary: Liver measures 20.0 cm in length. No focal liver lesions are evident. Gallbladder is somewhat distended with cholelithiasis in the dependent portion. No gallbladder wall thickening. No biliary duct dilatation. Pancreas: No pancreatic mass or inflammatory focus. Spleen: No splenic lesions evident. Adrenals/Urinary Tract: Adrenals appear normal bilaterally. Kidneys bilaterally show no evident mass or hydronephrosis on either side. No renal or ureteral calculus evident on either side. Urinary bladder is midline with wall thickness within normal limits. Stomach/Bowel: There is no appreciable bowel wall or mesenteric thickening. No bowel obstruction. No free air or portal venous air.  Vascular/Lymphatic: There is no abdominal aortic aneurysm. Major mesenteric arterial vessels appear patent. There is thickening of the mesenteric in the retroperitoneum at the level of the inferior vena cava inferior to the liver with thickening of the retroperitoneum surrounding the inferior vena cava and extending to the level of the aorta at the mid kidney level. Several small lymph nodes are noted in this area. The inferior vena cava does not appear thrombosed. Beyond the lymph nodes seen in the areas of mesenteric thickening in the retroperitoneum at the level of the inferior vena cava, there is no adenopathy appreciable in the abdomen or pelvis. Reproductive: Uterus is anteverted. No pelvic mass or pelvic fluid collection. Other: Appendix appears unremarkable. No abscess or ascites evident in the abdomen or pelvis. There is a small ventral hernia containing only fat. There are areas of scarring in the anterior abdominal wall. No abdominal wall fluid collection evident. Musculoskeletal: There are no blastic or lytic bone lesions. No intramuscular lesions are evident. IMPRESSION: 1. Mesenteric and fat thickening and stranding in the retroperitoneum centered at the level of the infrahepatic inferior vena cava but extending posteriorly and to the left to surround a portion of the aorta. Inferiorly, inflammation terminates at the approximate level of the junction of the common femoral veins with the inferior vena cava. There is no thickening in either perinephric fascia region. There are several mildly prominent lymph nodes in this area. 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 It should be noted that atypical lymphoma could present in this manner. A retroperitoneal sarcoma, while rare, could also present in this manner. There is no appreciable thrombus in the inferior vena cava. Inflammation does not appear to arise from the inferior  vena cava itself. It may be reasonable to consider nuclear medicine PET study to assess degree of metabolic activity in this area. 2. Cholelithiasis. Gallbladder mildly distended. No gallbladder wall thickening. 3.  No bowel obstruction.  No abscess.  Appendix appears normal. 4.  Scarring anterior abdominal wall.  No fluid in this area. Electronically Signed: By: Bretta Bang III M.D. On: 01/29/2017 13:53   Dg Foot Complete Left  Result Date: 01/16/2017  CLINICAL DATA:  Fall at work 1 month ago. Persistent left foot pain mainly in region of great toe. EXAM: LEFT FOOT - COMPLETE 3+ VIEW COMPARISON:  None. FINDINGS: There is no evidence of fracture or dislocation. There is no evidence of arthropathy or other focal bone abnormality. Soft tissues are unremarkable. IMPRESSION: Negative. Electronically Signed   By: Myles Rosenthal M.D.   On: 01/16/2017 13:48  [4 week]   Impression: 21 year old female presenting with 4 day history of acute on chronic right upper quadrant pain associated with nausea and vomiting. CT abdomen pelvis with cholelithiasis, mildly distended gallbladder but no gallbladder wall thickening. HIDA June 2017 unremarkable. She describes progressive symptoms specifically over last 3-4 weeks but over the last few days has been constant. CT with mesenteric and fat thickening and stranding in the retroperitoneal at the level of the infrahepatic inferior vena cava but extending posteriorly into the left to surround the portion of the aorta. Inferiorly the inflammation terminates at the proximal level of the junction of the common femoral veins with inferior vena cava. Several mildly prominent lymph nodes in this area. Unclear etiology. Unclear if current symptoms are related.   Plan: 1. Discussed with Dr. Jena Gauss. Pursue HIDA scan with [facility]. 2. Supportive measures. 3. Will need further evaluation of abnormal CT findings outlined above.  4. Further recommendations to follow.  We would like to  thank you for the opportunity to participate in the care of Tailynn S Daleo.  Leanna Battles. Dixon Boos [facility] [phone] 7/19/20185:02 PM     LOS: 0 days

## 2017-01-29 NOTE — ED Provider Notes (Signed)
[facility] Provider Note   CSN: [id number] Arrival date & time: 01/29/17  1026     History   Chief Complaint Chief Complaint  Patient presents with  . Abdominal Pain    HPI Jacqueline Alvarado is a 21 y.o. female.  HPI Pt was seen at 1135.  Per pt, c/o gradual onset and persistence of constant acute flair of her chronic RUQ abd "pain" for the past 4 days.  Has been associated with multiple intermittent episodes of diarrhea, followed by constipation for the past 2 days, and one episode of N/V since yesterday.  Describes the abd pain as "sharp" and "dull," and per her usual chronic pain pattern. Denies fevers, no back pain, no rash, no CP/SOB, no black or blood in stools or emesis. The symptoms have been associated with no other complaints. The patient has a significant history of similar symptoms previously, recently being evaluated for this complaint and multiple prior evals for same without definitive diagnosis.     Past Medical History:  Diagnosis Date  . Abdominal pain, chronic, right upper quadrant   . Abnormal Pap smear of cervix   . Anemia   . Anxiety   . Depression   . Headache(784.0)   . Low iron    Hx: of  . PONV (postoperative nausea and vomiting)    Hx: of nausea only at age 80    Patient Active Problem List   Diagnosis Date Noted  . Cicatrix   . Hemorrhoid   . Rectal bleeding 12/20/2015  . RUQ pain 12/20/2015  . Hemorrhoids 12/20/2015  . Contraceptive management 09/10/2015  . Trichomonal vaginitis 09/10/2015  . BV (bacterial vaginosis) 09/10/2015  . Hypertrophy of nasal turbinates 06/24/2013  . Tonsillar hypertrophy 06/24/2013  . Obstructive sleep apnea (adult) (pediatric) 06/24/2013  . Morbid obesity (HCC) 06/24/2013    Past Surgical History:  Procedure Laterality Date  . COLONOSCOPY N/A 01/07/2016   Procedure: COLONOSCOPY;  Surgeon: West Bali, MD;  Location: [facility];  Service: Endoscopy;  Laterality: N/A;  12:15  . HEMORRHOID BANDING  N/A 01/07/2016   Procedure: HEMORRHOID BANDING;  Surgeon: West Bali, MD;  Location: [facility];  Service: Endoscopy;  Laterality: N/A;  . MASS EXCISION N/A 11/26/2016   Procedure: EXCISION OF ABDOMINAL CICATRIX 4CM;  Surgeon: Franky Macho, MD;  Location: [facility] ORS;  Service: General;  Laterality: N/A;  p knows to arrive at 7:50  . NASAL SEPTOPLASTY W/ TURBINOPLASTY Bilateral 06/24/2013   Procedure: TURBINATE REDUCTION;  Surgeon: Flo Shanks, MD;  Location: [facility] OR;  Service: ENT;  Laterality: Bilateral;  . TONSILLECTOMY AND ADENOIDECTOMY Bilateral 06/24/2013   Procedure: TONSILLECTOMY ;  Surgeon: Flo Shanks, MD;  Location: [facility] OR;  Service: ENT;  Laterality: Bilateral;  . TUMOR EXCISION     Hx: of right side of neck  . WISDOM TOOTH EXTRACTION      OB History    No data available       Home Medications    Prior to Admission medications   Medication Sig Start Date End Date Taking? Authorizing Provider  acetaminophen (TYLENOL) 500 MG tablet Take 1,000 mg by mouth every 6 (six) hours as needed for mild pain or headache.    [provider]  clonazePAM (KLONOPIN) 1 MG tablet Take 1 mg by mouth 2 (two) times daily as needed for anxiety.  10/01/16   [provider]  escitalopram (LEXAPRO) 20 MG tablet Take 20 mg by mouth daily.  10/01/16   [provider]  HYDROcodone-acetaminophen (NORCO) 5-325 MG tablet Take 1-2 tablets by mouth every 4 (four) hours as needed for moderate pain. 11/26/16   Franky MachoJenkins, Mark, MD  ibuprofen (ADVIL,MOTRIN) 200 MG tablet Take 400-600 mg by mouth every 6 (six) hours as needed for headache or mild pain.    [provider]  norgestimate-ethinyl estradiol (MONONESSA) 0.25-35 MG-MCG tablet Take 1 tablet by mouth daily.    [provider]  ondansetron (ZOFRAN) 4 MG tablet Take 1 tablet (4 mg total) by mouth every 8 (eight) hours as needed. 11/26/16   Franky MachoJenkins, Mark, MD    Family History Family History  Problem Relation Age  of Onset  . Cancer - Other Mother   . Arthritis Mother   . Hypertension Father   . COPD Father   . Cancer - Cervical Other   . Diabetes Other   . Stroke Other     Social History Social History  Substance Use Topics  . Smoking status: Former Smoker    Years: 1.00    Types: Cigarettes    Quit date: 10/22/2016  . Smokeless tobacco: Never Used     Comment: smoked only 1-2 cig weekly for 1 year  . Alcohol use 0.0 oz/week     Comment: occasional     Allergies   Patient has no known allergies.   Review of Systems Review of Systems ROS: Statement: All systems negative except as marked or noted in the HPI; Constitutional: Negative for fever and chills. ; ; Eyes: Negative for eye pain, redness and discharge. ; ; ENMT: Negative for ear pain, hoarseness, nasal congestion, sinus pressure and sore throat. ; ; Cardiovascular: Negative for chest pain, palpitations, diaphoresis, dyspnea and peripheral edema. ; ; Respiratory: Negative for cough, wheezing and stridor. ; ; Gastrointestinal: Negative for nausea, vomiting, diarrhea, abdominal pain, blood in stool, hematemesis, jaundice and rectal bleeding. . ; ; Genitourinary: Negative for dysuria, flank pain and hematuria. ; ; Musculoskeletal: Negative for back pain and neck pain. Negative for swelling and trauma.; ; Skin: Negative for pruritus, rash, abrasions, blisters, bruising and skin lesion.; ; Neuro: Negative for headache, lightheadedness and neck stiffness. Negative for weakness, altered level of consciousness, altered mental status, extremity weakness, paresthesias, involuntary movement, seizure and syncope.       Physical Exam Updated Vital Signs BP (!) 161/82 (BP Location: Right Arm)   Pulse 67   Temp 98.5 F (36.9 C) (Oral)   Resp 17   Ht 5' 8.5" (1.74 m)   Wt (!) 153.8 kg (339 lb)   LMP 01/08/2017   SpO2 100%   BMI 50.80 kg/m   Physical Exam 1140: Physical examination:  Nursing notes reviewed; Vital signs and O2 SAT  reviewed;  Constitutional: Well developed, Well nourished, Well hydrated, Uncomfortable appearing; Head:  Normocephalic, atraumatic; Eyes: EOMI, PERRL, No scleral icterus; ENMT: Mouth and pharynx normal, Mucous membranes moist; Neck: Supple, Full range of motion, No lymphadenopathy; Cardiovascular: Regular rate and rhythm, No gallop; Respiratory: Breath sounds clear & equal bilaterally, No wheezes.  Speaking full sentences with ease, Normal respiratory effort/excursion; Chest: Nontender, Movement normal; Abdomen: Soft, +RUQ tenderness to palp. No rebound or guarding. Nondistended, Normal bowel sounds; Genitourinary: No CVA tenderness; Extremities: Pulses normal, No tenderness, No edema, No calf edema or asymmetry.; Neuro: AA&Ox3, Major CN grossly intact.  Speech clear. No gross focal motor or sensory deficits in extremities.; Skin: Color normal, Warm, Dry.   ED Treatments / Results  Labs (all labs ordered are listed, but only  abnormal results are displayed)   EKG  EKG Interpretation None       Radiology   Procedures Procedures (including critical care time)  Medications Ordered in ED Medications  morphine 4 MG/ML injection 4 mg (4 mg Intravenous Given 01/29/17 1203)  ondansetron (ZOFRAN) injection 4 mg (4 mg Intravenous Given 01/29/17 1203)  dicyclomine (BENTYL) injection 20 mg (20 mg Intramuscular Given 01/29/17 1206)     Initial Impression / Assessment and Plan / ED Course  I have reviewed the triage vital signs and the nursing notes.  Pertinent labs & imaging results that were available during my care of the patient were reviewed by me and considered in my medical decision making (see chart for details).  MDM Reviewed: previous chart, nursing note and vitals Reviewed previous: labs Interpretation: labs and CT scan   Results for orders placed or performed during the hospital encounter of 01/29/17  Lipase, blood  Result Value Ref Range   Lipase 20 11 - 51 U/L  Comprehensive  metabolic panel  Result Value Ref Range   Sodium 135 135 - 145 mmol/L   Potassium 3.8 3.5 - 5.1 mmol/L   Chloride 103 101 - 111 mmol/L   CO2 21 (L) 22 - 32 mmol/L   Glucose, Bld 96 65 - 99 mg/dL   BUN 10 6 - 20 mg/dL   Creatinine, Ser 4.54 0.44 - 1.00 mg/dL   Calcium 9.0 8.9 - 09.8 mg/dL   Total Protein 7.7 6.5 - 8.1 g/dL   Albumin 3.9 3.5 - 5.0 g/dL   AST 26 15 - 41 U/L   ALT 21 14 - 54 U/L   Alkaline Phosphatase 36 (L) 38 - 126 U/L   Total Bilirubin 0.9 0.3 - 1.2 mg/dL   GFR calc non Af Amer >60 >60 mL/min   GFR calc Af Amer >60 >60 mL/min   Anion gap 11 5 - 15  Urinalysis, Routine w reflex microscopic  Result Value Ref Range   Color, Urine YELLOW YELLOW   APPearance CLEAR CLEAR   Specific Gravity, Urine 1.015 1.005 - 1.030   pH 8.0 5.0 - 8.0   Glucose, UA NEGATIVE NEGATIVE mg/dL   Hgb urine dipstick NEGATIVE NEGATIVE   Bilirubin Urine NEGATIVE NEGATIVE   Ketones, ur NEGATIVE NEGATIVE mg/dL   Protein, ur NEGATIVE NEGATIVE mg/dL   Nitrite NEGATIVE NEGATIVE   Leukocytes, UA NEGATIVE NEGATIVE  CBC with Differential  Result Value Ref Range   WBC 14.1 (H) 4.0 - 10.5 K/uL   RBC 5.70 (H) 3.87 - 5.11 MIL/uL   Hemoglobin 13.1 12.0 - 15.0 g/dL   HCT 11.9 14.7 - 82.9 %   MCV 70.7 (L) 78.0 - 100.0 fL   MCH 23.0 (L) 26.0 - 34.0 pg   MCHC 32.5 30.0 - 36.0 g/dL   RDW 56.2 (H) 13.0 - 86.5 %   Platelets 281 150 - 400 K/uL   Neutrophils Relative % 66 %   Neutro Abs 9.3 (H) 1.7 - 7.7 K/uL   Lymphocytes Relative 24 %   Lymphs Abs 3.4 0.7 - 4.0 K/uL   Monocytes Relative 9 %   Monocytes Absolute 1.3 (H) 0.1 - 1.0 K/uL   Eosinophils Relative 1 %   Eosinophils Absolute 0.2 0.0 - 0.7 K/uL   Basophils Relative 0 %   Basophils Absolute 0.0 0.0 - 0.1 K/uL  Pregnancy, urine  Result Value Ref Range   Preg Test, Ur NEGATIVE NEGATIVE    Ct Abdomen Pelvis W Contrast Addendum Date: 01/29/2017  ADDENDUM REPORT: 01/29/2017 14:10 ADDENDUM: Prior chest CT from October 17, 2005 does not extend  inferiorly through the area of mesenteric thickening. No mesenteric inflammation is noted at that time along the superior aspect of the area of current inflammation. Mild prominence of a retroperitoneal lymph node on the left seen on the 2007 study appears essentially stable at this time. Other mildly prominent lymph nodes are seen slightly inferior to the caudal extent of imaging from the 2007 study. Electronically Signed   By: Bretta Bang III M.D.   On: 01/29/2017 14:10   Result Date: 01/29/2017 CLINICAL DATA:  Right upper quadrant pain, chronic EXAM: CT ABDOMEN AND PELVIS WITH CONTRAST TECHNIQUE: Multidetector CT imaging of the abdomen and pelvis was performed using the standard protocol following bolus administration of intravenous contrast. CONTRAST:  ISOVUE-300 IOPAMIDOL (ISOVUE-300) INJECTION 61% COMPARISON:  None. FINDINGS: Lower chest: There is mild atelectatic change in superior segment of the right low there are areas of apparent scarring in the anterior abdominal wall. There is a small Er lobe. Visualized lung bases are otherwise clear. Hepatobiliary: Liver measures 20.0 cm in length. No focal liver lesions are evident. Gallbladder is somewhat distended with cholelithiasis in the dependent portion. No gallbladder wall thickening. No biliary duct dilatation. Pancreas: No pancreatic mass or inflammatory focus. Spleen: No splenic lesions evident. Adrenals/Urinary Tract: Adrenals appear normal bilaterally. Kidneys bilaterally show no evident mass or hydronephrosis on either side. No renal or ureteral calculus evident on either side. Urinary bladder is midline with wall thickness within normal limits. Stomach/Bowel: There is no appreciable bowel wall or mesenteric thickening. No bowel obstruction. No free air or portal venous air. Vascular/Lymphatic: There is no abdominal aortic aneurysm. Major mesenteric arterial vessels appear patent. There is thickening of the mesenteric in the  retroperitoneum at the level of the inferior vena cava inferior to the liver with thickening of the retroperitoneum surrounding the inferior vena cava and extending to the level of the aorta at the mid kidney level. Several small lymph nodes are noted in this area. The inferior vena cava does not appear thrombosed. Beyond the lymph nodes seen in the areas of mesenteric thickening in the retroperitoneum at the level of the inferior vena cava, there is no adenopathy appreciable in the abdomen or pelvis. Reproductive: Uterus is anteverted. No pelvic mass or pelvic fluid collection. Other: Appendix appears unremarkable. No abscess or ascites evident in the abdomen or pelvis. There is a small ventral hernia containing only fat. There are areas of scarring in the anterior abdominal wall. No abdominal wall fluid collection evident. Musculoskeletal: There are no blastic or lytic bone lesions. No intramuscular lesions are evident. IMPRESSION: 1. Mesenteric and fat thickening and stranding in the retroperitoneum centered at the level of the infrahepatic inferior vena cava but extending posteriorly and to the left to surround a portion of the aorta. Inferiorly, inflammation terminates at the approximate level of the junction of the common femoral veins with the inferior vena cava. There is no thickening in either perinephric fascia region. There are several mildly prominent lymph nodes in this area. 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 It should be noted that atypical lymphoma could present in this manner. A retroperitoneal sarcoma, while rare, could also present in this manner. There is no appreciable thrombus in the inferior vena cava. Inflammation does not appear to arise from the inferior vena cava itself. It may be reasonable to consider nuclear medicine  PET study to assess degree of metabolic activity in this area. 2. Cholelithiasis.  Gallbladder mildly distended. No gallbladder wall thickening. 3.  No bowel obstruction.  No abscess.  Appendix appears normal. 4.  Scarring anterior abdominal wall.  No fluid in this area. Electronically Signed: By: Bretta Bang III M.D. On: 01/29/2017 13:53     1405:  Labs reassuring, but pt continues to c/o pain.  CT scan with new gallstones compared to previous US RUQ and HIDA scan (2016 and 2017).  CT scan as above and d/w Rads MD regarding same: does not appear vascular origin, just inflammatory.  T/C to Onc Dr. Janyth Contes, case discussed, including:  HPI, pertinent PM/SHx, VS/PE, dx testing, ED course and treatment:  She has viewed the images, states lymph nodes are small/not numerous, which would likely not be lymphoma but pt will need office f/u. T/C to GI Dr. Jena Gauss, case discussed, including:  HPI, pertinent PM/SHx, VS/PE, dx testing, ED course and treatment:  Agreeable to consult, will need further workup (ie: HIDA).   T/C to General Surgery Dr. Lovell Sheehan, case discussed, including:  HPI, pertinent PM/SHx, VS/PE, dx testing, ED course and treatment:  Agrees with GI MD, clears OK, agreeable to consult.   1520:  T/C to [facility] Dr. Ophelia Charter, case discussed, including:  HPI, pertinent PM/SHx, VS/PE, dx testing, ED course and treatment, as well as multiple consults above:  Agreeable to admit.    Final Clinical Impressions(s) / ED Diagnoses   Final diagnoses:  None    New Prescriptions New Prescriptions   No medications on file     Samuel Jester, DO 02/02/17 1549

## 2017-01-29 NOTE — ED Triage Notes (Signed)
Pt states has had RUQ pain x 1 year. Has had scans that were "inconclusive" per pt. Pt restless. Worse in past 4 days. N/v x 1 in pat 24 hrs. No bm in past 2 days.

## 2017-01-29 NOTE — ED Notes (Signed)
Pt requesting more pain medication at this time.  

## 2017-01-30 ENCOUNTER — Observation Stay (HOSPITAL_COMMUNITY): Payer: BLUE CROSS/BLUE SHIELD

## 2017-01-30 ENCOUNTER — Encounter (HOSPITAL_COMMUNITY): Payer: Self-pay

## 2017-01-30 DIAGNOSIS — D72829 Elevated white blood cell count, unspecified: Secondary | ICD-10-CM

## 2017-01-30 DIAGNOSIS — N135 Crossing vessel and stricture of ureter without hydronephrosis: Secondary | ICD-10-CM | POA: Diagnosis not present

## 2017-01-30 DIAGNOSIS — K8050 Calculus of bile duct without cholangitis or cholecystitis without obstruction: Secondary | ICD-10-CM | POA: Diagnosis not present

## 2017-01-30 DIAGNOSIS — K8020 Calculus of gallbladder without cholecystitis without obstruction: Secondary | ICD-10-CM | POA: Diagnosis not present

## 2017-01-30 DIAGNOSIS — R1011 Right upper quadrant pain: Secondary | ICD-10-CM

## 2017-01-30 LAB — CBC
HEMATOCRIT: 37.6 % (ref 36.0–46.0)
Hemoglobin: 12 g/dL (ref 12.0–15.0)
MCH: 22.7 pg — ABNORMAL LOW (ref 26.0–34.0)
MCHC: 31.9 g/dL (ref 30.0–36.0)
MCV: 71.2 fL — AB (ref 78.0–100.0)
PLATELETS: 280 10*3/uL (ref 150–400)
RBC: 5.28 MIL/uL — ABNORMAL HIGH (ref 3.87–5.11)
RDW: 15.5 % (ref 11.5–15.5)
WBC: 14.6 10*3/uL — AB (ref 4.0–10.5)

## 2017-01-30 LAB — BASIC METABOLIC PANEL
Anion gap: 7 (ref 5–15)
BUN: 7 mg/dL (ref 6–20)
CO2: 24 mmol/L (ref 22–32)
CREATININE: 0.7 mg/dL (ref 0.44–1.00)
Calcium: 8.8 mg/dL — ABNORMAL LOW (ref 8.9–10.3)
Chloride: 103 mmol/L (ref 101–111)
GFR calc Af Amer: >60 mL/min (ref >60)
GLUCOSE: 101 mg/dL — AB (ref 65–99)
POTASSIUM: 3.7 mmol/L (ref 3.5–5.1)
SODIUM: 134 mmol/L — AB (ref 135–145)

## 2017-01-30 MED ORDER — MORPHINE SULFATE (PF) 4 MG/ML IV SOLN
4.0000 mg | Freq: Once | INTRAVENOUS | Status: AC
  Administered 2017-01-30: 4 mg via INTRAVENOUS
  Filled 2017-01-30: qty 1

## 2017-01-30 MED ORDER — HYDROMORPHONE HCL 1 MG/ML IJ SOLN
1.0000 mg | INTRAMUSCULAR | Status: DC | PRN
Start: 2017-01-30 — End: 2017-02-01
  Administered 2017-01-30 – 2017-02-01 (×9): 1 mg via INTRAVENOUS
  Filled 2017-01-30 (×9): qty 1

## 2017-01-30 MED ORDER — TECHNETIUM TC 99M MEBROFENIN IV KIT
5.0000 | PACK | Freq: Once | INTRAVENOUS | Status: AC | PRN
  Administered 2017-01-30: 5.5 via INTRAVENOUS

## 2017-01-30 NOTE — Plan of Care (Signed)
Problem: Pain Managment: Goal: General experience of comfort will improve Outcome: Not Progressing Pt states pain medication regime is not relieving pain, will inform physician in the am

## 2017-01-30 NOTE — Consult Note (Signed)
Awaiting HIDA scan results.  Full consult to follow.

## 2017-01-30 NOTE — Progress Notes (Signed)
Subjective: Pain somewhat improved today (8/10). Just received pain medication which has helped. Was just given clear liquids and is trialing, so far no nausea. Denies hematemesis, melena. No other GI complaints at this time.  Objective: Vital signs in last 24 hours: Temp:  [98.4 F (36.9 C)-99 F (37.2 C)] 99 F (37.2 C) (07/20 0628) Pulse Rate:  [47-68] 63 (07/20 0628) Resp:  [17-20] 18 (07/20 0628) BP: (126-161)/(52-92) 126/52 (07/20 0628) SpO2:  [96 %-100 %] 100 % (07/20 0628) Weight:  [338 lb 11.2 oz (153.6 kg)-339 lb (153.8 kg)] 338 lb 11.2 oz (153.6 kg) (07/19 1849) Last BM Date: 01/27/17 General:   Alert and oriented, pleasant Head:  Normocephalic and atraumatic. Eyes:  No icterus, sclera clear. Conjuctiva pink.  Heart:  S1, S2 present, no murmurs noted.  Lungs: Clear to auscultation bilaterally, without wheezing, rales, or rhonchi.  Abdomen:  Bowel sounds present, soft, non-distended. Abdominal TTP noted RUQ. No HSM or hernias noted. No rebound or guarding. Msk:  Symmetrical without gross deformities. Pulses:  Normal bilateral DP pulses noted. Extremities:  Without clubbing or edema. Neurologic:  Alert and  oriented x4;  grossly normal neurologically. Psych:  Alert and cooperative. Normal mood and affect.  Intake/Output from previous day: 07/19 0701 - 07/20 0700 In: 622.5 [I.V.:622.5] Out: -  Intake/Output this shift: No intake/output data recorded.  Lab Results:  Recent Labs  01/29/17 1124 01/30/17 0632  WBC 14.1* 14.6*  HGB 13.1 12.0  HCT 40.3 37.6  PLT 281 280   BMET  Recent Labs  01/29/17 1124 01/30/17 0632  NA 135 134*  K 3.8 3.7  CL 103 103  CO2 21* 24  GLUCOSE 96 101*  BUN 10 7  CREATININE 0.71 0.70  CALCIUM 9.0 8.8*   LFT  Recent Labs  01/29/17 1124  PROT 7.7  ALBUMIN 3.9  AST 26  ALT 21  ALKPHOS 36*  BILITOT 0.9   PT/INR No results for input(s): LABPROT, INR in the last 72 hours. Hepatitis Panel No results for  input(s): HEPBSAG, HCVAB, HEPAIGM, HEPBIGM in the last 72 hours.   Studies/Results: Ct Abdomen Pelvis W Contrast  Addendum Date: 01/29/2017   ADDENDUM REPORT: 01/29/2017 14:10 ADDENDUM: Prior chest CT from October 17, 2005 does not extend inferiorly through the area of mesenteric thickening. No mesenteric inflammation is noted at that time along the superior aspect of the area of current inflammation. Mild prominence of a retroperitoneal lymph node on the left seen on the 2007 study appears essentially stable at this time. Other mildly prominent lymph nodes are seen slightly inferior to the caudal extent of imaging from the 2007 study. Electronically Signed   By: Bretta BangWilliam  Woodruff III M.D.   On: 01/29/2017 14:10   Result Date: 01/29/2017 CLINICAL DATA:  Right upper quadrant pain, chronic EXAM: CT ABDOMEN AND PELVIS WITH CONTRAST TECHNIQUE: Multidetector CT imaging of the abdomen and pelvis was performed using the standard protocol following bolus administration of intravenous contrast. CONTRAST:  125mL ISOVUE-300 IOPAMIDOL (ISOVUE-300) INJECTION 61% COMPARISON:  None. FINDINGS: Lower chest: There is mild atelectatic change in superior segment of the right low there are areas of apparent scarring in the anterior abdominal wall. There is a small Er lobe. Visualized lung bases are otherwise clear. Hepatobiliary: Liver measures 20.0 cm in length. No focal liver lesions are evident. Gallbladder is somewhat distended with cholelithiasis in the dependent portion. No gallbladder wall thickening. No biliary duct dilatation. Pancreas: No pancreatic mass or inflammatory focus. Spleen: No splenic  lesions evident. Adrenals/Urinary Tract: Adrenals appear normal bilaterally. Kidneys bilaterally show no evident mass or hydronephrosis on either side. No renal or ureteral calculus evident on either side. Urinary bladder is midline with wall thickness within normal limits. Stomach/Bowel: There is no appreciable bowel wall or  mesenteric thickening. No bowel obstruction. No free air or portal venous air. Vascular/Lymphatic: There is no abdominal aortic aneurysm. Major mesenteric arterial vessels appear patent. There is thickening of the mesenteric in the retroperitoneum at the level of the inferior vena cava inferior to the liver with thickening of the retroperitoneum surrounding the inferior vena cava and extending to the level of the aorta at the mid kidney level. Several small lymph nodes are noted in this area. The inferior vena cava does not appear thrombosed. Beyond the lymph nodes seen in the areas of mesenteric thickening in the retroperitoneum at the level of the inferior vena cava, there is no adenopathy appreciable in the abdomen or pelvis. Reproductive: Uterus is anteverted. No pelvic mass or pelvic fluid collection. Other: Appendix appears unremarkable. No abscess or ascites evident in the abdomen or pelvis. There is a small ventral hernia containing only fat. There are areas of scarring in the anterior abdominal wall. No abdominal wall fluid collection evident. Musculoskeletal: There are no blastic or lytic bone lesions. No intramuscular lesions are evident. IMPRESSION: 1. Mesenteric and fat thickening and stranding in the retroperitoneum centered at the level of the infrahepatic inferior vena cava but extending posteriorly and to the left to surround a portion of the aorta. Inferiorly, inflammation terminates at the approximate level of the junction of the common femoral veins with the inferior vena cava. There is no thickening in either perinephric fascia region. There are several mildly prominent lymph nodes in this area. 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 It should be noted that atypical lymphoma could present in this manner. A retroperitoneal sarcoma, while rare, could also present in this manner. There is no appreciable thrombus in  the inferior vena cava. Inflammation does not appear to arise from the inferior vena cava itself. It may be reasonable to consider nuclear medicine PET study to assess degree of metabolic activity in this area. 2. Cholelithiasis. Gallbladder mildly distended. No gallbladder wall thickening. 3.  No bowel obstruction.  No abscess.  Appendix appears normal. 4.  Scarring anterior abdominal wall.  No fluid in this area. Electronically Signed: By: Bretta Bang III M.D. On: 01/29/2017 13:53    Assessment: 21 year old female presenting with 4 day history of acute on chronic right upper quadrant pain associated with nausea and vomiting. CT abdomen pelvis with cholelithiasis, mildly distended gallbladder but no gallbladder wall thickening. HIDA June 2017 unremarkable. She describes progressive symptoms specifically over last 3-4 weeks but over the last few days has been constant. CT with mesenteric and fat thickening and stranding in the retroperitoneal at the level of the infrahepatic inferior vena cava but extending posteriorly into the left to surround the portion of the aorta. Inferiorly the inflammation terminates at the proximal level of the junction of the common femoral veins with inferior vena cava. Several mildly prominent lymph nodes in this area. Unclear etiology. Unclear if current symptoms are related.   Today her CBC shows stable but persistently elevated WBC at 14.6, normal hgb 12.0. BMP with a couple slight electrolyte abnormalities. She is currently undergoing HIDA scan and will follow for results. Surgery has been consulted by the ER already.elated to  pelvic CT findings as they are likely incidental.  Her HIDA scan has come back with non-visualization of the gallbladder and onsistent with acute cholecystitis. Surgery has been consulted and was waiting on HIDA results (which just resulted recently).  Plan: 1. Surgical consult for consideration of cholecystectomy 2. We will sign off at this  time and defer to surgery   Thank you for allowing [place] to participate in the care of Jacqueline Alvarado  Wynne Dust, DNP, AGNP-C Adult & Gerontological Nurse Practitioner [facility]    LOS: 0 days    01/30/2017, 9:42 AM

## 2017-01-30 NOTE — Progress Notes (Signed)
PROGRESS NOTE    Jacqueline Alvarado  WUJ:[id number] DOB: 1995/10/16 DOA: 01/29/2017 PCP: Gareth Morgan, MD    Brief Narrative:  21 year old female presents with right upper quadrant abdominal pain. Patient is known to have history of depression/anxiety, headaches. Presents with intermittent right upper quadrant pain, worse with meals, it has been more persistent over last 24 hours, associated with abdominal distention, no improving factors. On initial physical examination blood pressure 143/92, heart rate 68, oxygen saturation 100%. Moist mucous membranes, lungs clear to auscultation bilaterally, heart S1-S2 present rhythmic, the abdomen soft, tender in the right upper quadrant, positive Murphy sign, no rebound or guarding. No lower extremity edema. Sodium 135, potassium 3.8, chloride 103, bicarbonate 21, glucose 96, BUN 10, creatinine 0.71, white count 14.1, hemo-13.1, hematocrit 40.3, platelets 281, urinalysis negative for infection, negative pregnancy test. CT of the abdomen with mesenteric and fat thickening and stranding in the retroperitoneum. Several mildly prominent lymph nodes. Cholelithiasis, gallbladder mildly distended. No gallbladder wall thickening.  Patient admitted to the hospital working diagnosis of right upper quadrant abdominal pain rule out biliary colic.   Assessment & Plan:   Principal Problem:   RUQ pain Active Problems:   Morbid obesity (HCC)  1. Suspected biliary colic. Will continue IV fluids, clear liquid diet, pain control not sufficient with morphine will change to low dose hydromorphone. Will follow with HIDA scan results, follow with Gi and surgery recommendations  2. Morbid obesity. Will continue early mobilization and dvt prophylaxis.   3. Leukocytosis. Presumed to be reactive, no signs of systemic infection, will continue to hold on antibiotic therapy.    DVT prophylaxis: enoxaparin Code Status: full Family Communication: no family at the bedside    Disposition Plan: home   Consultants:   GI   Surgery  Procedures:   Antimicrobials:   Subjective: Patient with abdominal pain, right upper quadrant, worse with meals and touch, improved with pain medications, associated with nausea and vomiting.   Objective: Vitals:   01/29/17 1849 01/29/17 2056 01/29/17 2116 01/30/17 0628  BP: (!) 158/81  127/63 (!) 126/52  Pulse: (!) 52  (!) 51 63  Resp: 20  18 18   Temp: 98.8 F (37.1 C)  98.4 F (36.9 C) 99 F (37.2 C)  TempSrc: Oral  Oral Oral  SpO2: 100% 96% 100% 100%  Weight: (!) 153.6 kg (338 lb 11.2 oz)     Height: 5' 8.5" (1.74 m)       Intake/Output Summary (Last 24 hours) at 01/30/17 1212 Last data filed at 01/30/17 0300  Gross per 24 hour  Intake            622.5 ml  Output                0 ml  Net            622.5 ml   Filed Weights   01/29/17 1047 01/29/17 1849  Weight: (!) 153.8 kg (339 lb) (!) 153.6 kg (338 lb 11.2 oz)    Examination:  General exam: deconditioned E ENT: no pallor or icterus Respiratory system: Clear to auscultation. Respiratory effort normal. No wheezing, rales or rhonchi.  Cardiovascular system: S1 & S2 heard, RRR. No JVD, murmurs, rubs, gallops or clicks. No pedal edema. Gastrointestinal system: Abdomen protuberant, is nondistended, soft. No organomegaly or masses felt. Normal bowel sounds heard. Tender at the RUQ, murphy sign positive.  Central nervous system: Alert and oriented. No focal neurological deficits. Extremities: Symmetric 5 x 5 power. Skin: No  rashes, lesions or ulcers     Data Reviewed: I have personally reviewed following labs and imaging studies  CBC:  Recent Labs Lab 01/29/17 1124 01/30/17 0632  WBC 14.1* 14.6*  NEUTROABS 9.3*  --   HGB 13.1 12.0  HCT 40.3 37.6  MCV 70.7* 71.2*  PLT 281 280   Basic Metabolic Panel:  Recent Labs Lab 01/29/17 1124 01/30/17 0632  NA 135 134*  K 3.8 3.7  CL 103 103  CO2 21* 24  GLUCOSE 96 101*  BUN 10 7  CREATININE  0.71 0.70  CALCIUM 9.0 8.8*   GFR: Estimated Creatinine Clearance: 173.5 mL/min (by C-G formula based on SCr of 0.7 mg/dL). Liver Function Tests:  Recent Labs Lab 01/29/17 1124  AST 26  ALT 21  ALKPHOS 36*  BILITOT 0.9  PROT 7.7  ALBUMIN 3.9    Recent Labs Lab 01/29/17 1124  LIPASE 20   No results for input(s): AMMONIA in the last 168 hours. Coagulation Profile: No results for input(s): INR, PROTIME in the last 168 hours. Cardiac Enzymes: No results for input(s): CKTOTAL, CKMB, CKMBINDEX, TROPONINI in the last 168 hours. BNP (last 3 results) No results for input(s): PROBNP in the last 8760 hours. HbA1C: No results for input(s): HGBA1C in the last 72 hours. CBG: No results for input(s): GLUCAP in the last 168 hours. Lipid Profile: No results for input(s): CHOL, HDL, LDLCALC, TRIG, CHOLHDL, LDLDIRECT in the last 72 hours. Thyroid Function Tests: No results for input(s): TSH, T4TOTAL, FREET4, T3FREE, THYROIDAB in the last 72 hours. Anemia Panel: No results for input(s): VITAMINB12, FOLATE, FERRITIN, TIBC, IRON, RETICCTPCT in the last 72 hours. Sepsis Labs: No results for input(s): PROCALCITON, LATICACIDVEN in the last 168 hours.  No results found for this or any previous visit (from the past 240 hour(s)).       Radiology Studies: Nm Hepatobiliary Liver Func  Result Date: 01/30/2017 CLINICAL DATA:  Chronic RIGHT upper quadrant pain worsening over 5 days, vomiting, nausea, cholelithiasis EXAM: NUCLEAR MEDICINE HEPATOBILIARY IMAGING TECHNIQUE: Sequential images of the abdomen were obtained out to 60 minutes following intravenous administration of radiopharmaceutical. RADIOPHARMACEUTICALS:  5.5 mCi Tc-34m  Choletec IV COMPARISON:  12/21/2015 Correlation: CT abdomen and pelvis 01/29/2017 FINDINGS: Normal tracer clearance from bloodstream indicating normal hepatocellular function. Prompt excretion of tracer into biliary tree. Small bowel visualized by 20 minutes. At 1 hour,  gallbladder had not visualized. Relative photopenic area identified at the position of the gallbladder based on anatomy as demonstrated by coronal CT images. Patient was then injected with 4 mg of morphine sulfate IV and imaging was continued for an additional 60 minutes. Gallbladder did not visualize following morphine augmentation. Findings are consistent with cystic duct obstruction and acute cholecystitis. IMPRESSION: Nonvisualization of the gallbladder despite morphine augmentation consistent with acute cholecystitis. Normal hepatocellular function with patent CBD. Electronically Signed   By: Ulyses Southward M.D.   On: 01/30/2017 11:07   Ct Abdomen Pelvis W Contrast  Addendum Date: 01/29/2017   ADDENDUM REPORT: 01/29/2017 14:10 ADDENDUM: Prior chest CT from October 17, 2005 does not extend inferiorly through the area of mesenteric thickening. No mesenteric inflammation is noted at that time along the superior aspect of the area of current inflammation. Mild prominence of a retroperitoneal lymph node on the left seen on the 2007 study appears essentially stable at this time. Other mildly prominent lymph nodes are seen slightly inferior to the caudal extent of imaging from the 2007 study. Electronically Signed   By: Chrissie Noa  Margarita GrizzleWoodruff III M.D.   On: 01/29/2017 14:10   Result Date: 01/29/2017 CLINICAL DATA:  Right upper quadrant pain, chronic EXAM: CT ABDOMEN AND PELVIS WITH CONTRAST TECHNIQUE: Multidetector CT imaging of the abdomen and pelvis was performed using the standard protocol following bolus administration of intravenous contrast. CONTRAST:  125mL ISOVUE-300 IOPAMIDOL (ISOVUE-300) INJECTION 61% COMPARISON:  None. FINDINGS: Lower chest: There is mild atelectatic change in superior segment of the right low there are areas of apparent scarring in the anterior abdominal wall. There is a small Er lobe. Visualized lung bases are otherwise clear. Hepatobiliary: Liver measures 20.0 cm in length. No focal liver  lesions are evident. Gallbladder is somewhat distended with cholelithiasis in the dependent portion. No gallbladder wall thickening. No biliary duct dilatation. Pancreas: No pancreatic mass or inflammatory focus. Spleen: No splenic lesions evident. Adrenals/Urinary Tract: Adrenals appear normal bilaterally. Kidneys bilaterally show no evident mass or hydronephrosis on either side. No renal or ureteral calculus evident on either side. Urinary bladder is midline with wall thickness within normal limits. Stomach/Bowel: There is no appreciable bowel wall or mesenteric thickening. No bowel obstruction. No free air or portal venous air. Vascular/Lymphatic: There is no abdominal aortic aneurysm. Major mesenteric arterial vessels appear patent. There is thickening of the mesenteric in the retroperitoneum at the level of the inferior vena cava inferior to the liver with thickening of the retroperitoneum surrounding the inferior vena cava and extending to the level of the aorta at the mid kidney level. Several small lymph nodes are noted in this area. The inferior vena cava does not appear thrombosed. Beyond the lymph nodes seen in the areas of mesenteric thickening in the retroperitoneum at the level of the inferior vena cava, there is no adenopathy appreciable in the abdomen or pelvis. Reproductive: Uterus is anteverted. No pelvic mass or pelvic fluid collection. Other: Appendix appears unremarkable. No abscess or ascites evident in the abdomen or pelvis. There is a small ventral hernia containing only fat. There are areas of scarring in the anterior abdominal wall. No abdominal wall fluid collection evident. Musculoskeletal: There are no blastic or lytic bone lesions. No intramuscular lesions are evident. IMPRESSION: 1. Mesenteric and fat thickening and stranding in the retroperitoneum centered at the level of the infrahepatic inferior vena cava but extending posteriorly and to the left to surround a portion of the aorta.  Inferiorly, inflammation terminates at the approximate level of the junction of the common femoral veins with the inferior vena cava. There is no thickening in either perinephric fascia region. There are several mildly prominent lymph nodes in this area. 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 It should be noted that atypical lymphoma could present in this manner. A retroperitoneal sarcoma, while rare, could also present in this manner. There is no appreciable thrombus in the inferior vena cava. Inflammation does not appear to arise from the inferior vena cava itself. It may be reasonable to consider nuclear medicine PET study to assess degree of metabolic activity in this area. 2. Cholelithiasis. Gallbladder mildly distended. No gallbladder wall thickening. 3.  No bowel obstruction.  No abscess.  Appendix appears normal. 4.  Scarring anterior abdominal wall.  No fluid in this area. Electronically Signed: By: Bretta BangWilliam  Woodruff III M.D. On: 01/29/2017 13:53        Scheduled Meds: . escitalopram  20 mg Oral Daily  . norgestimate-ethinyl estradiol  1 tablet Oral Daily   Continuous Infusions: . sodium chloride  75 mL/hr at 01/30/17 0710     LOS: 0 days      Coralie Keens, MD [facility] Pager [phone]  If 7PM-7AM, please contact night-coverage [email or web address] Password [email or web address] 01/30/2017, 12:12 PM

## 2017-01-31 DIAGNOSIS — K8050 Calculus of bile duct without cholangitis or cholecystitis without obstruction: Secondary | ICD-10-CM | POA: Diagnosis not present

## 2017-01-31 DIAGNOSIS — K8000 Calculus of gallbladder with acute cholecystitis without obstruction: Principal | ICD-10-CM

## 2017-01-31 DIAGNOSIS — R1011 Right upper quadrant pain: Secondary | ICD-10-CM | POA: Diagnosis not present

## 2017-01-31 DIAGNOSIS — K810 Acute cholecystitis: Secondary | ICD-10-CM | POA: Diagnosis not present

## 2017-01-31 LAB — BASIC METABOLIC PANEL
Anion gap: 9 (ref 5–15)
BUN: 7 mg/dL (ref 6–20)
CHLORIDE: 104 mmol/L (ref 101–111)
CO2: 23 mmol/L (ref 22–32)
Calcium: 8.5 mg/dL — ABNORMAL LOW (ref 8.9–10.3)
Creatinine, Ser: 0.63 mg/dL (ref 0.44–1.00)
GFR calc Af Amer: >60 mL/min (ref >60)
GLUCOSE: 87 mg/dL (ref 65–99)
POTASSIUM: 3.6 mmol/L (ref 3.5–5.1)
Sodium: 136 mmol/L (ref 135–145)

## 2017-01-31 LAB — CBC WITH DIFFERENTIAL/PLATELET
Basophils Absolute: 0 10*3/uL (ref 0.0–0.1)
Basophils Relative: 0 %
Eosinophils Absolute: 0.3 10*3/uL (ref 0.0–0.7)
Eosinophils Relative: 4 %
HCT: 36.2 % (ref 36.0–46.0)
HEMOGLOBIN: 11.4 g/dL — AB (ref 12.0–15.0)
LYMPHS ABS: 2.9 10*3/uL (ref 0.7–4.0)
LYMPHS PCT: 31 %
MCH: 22.7 pg — AB (ref 26.0–34.0)
MCHC: 31.5 g/dL (ref 30.0–36.0)
MCV: 72.1 fL — AB (ref 78.0–100.0)
Monocytes Absolute: 1 10*3/uL (ref 0.1–1.0)
Monocytes Relative: 11 %
NEUTROS PCT: 54 %
Neutro Abs: 5.1 10*3/uL (ref 1.7–7.7)
Platelets: 274 10*3/uL (ref 150–400)
RBC: 5.02 MIL/uL (ref 3.87–5.11)
RDW: 15.6 % — ABNORMAL HIGH (ref 11.5–15.5)
WBC: 9.4 10*3/uL (ref 4.0–10.5)

## 2017-01-31 LAB — HIV ANTIBODY (ROUTINE TESTING W REFLEX): HIV Screen 4th Generation wRfx: NONREACTIVE

## 2017-01-31 MED ORDER — CHLORHEXIDINE GLUCONATE CLOTH 2 % EX PADS: 6.0000 | MEDICATED_PAD | Freq: Once | CUTANEOUS | Status: DC

## 2017-01-31 MED ORDER — CHLORHEXIDINE GLUCONATE CLOTH 2 % EX PADS
6.0000 | MEDICATED_PAD | Freq: Once | CUTANEOUS | Status: AC
  Administered 2017-02-02: 6 via TOPICAL

## 2017-01-31 MED ORDER — CHLORHEXIDINE GLUCONATE CLOTH 2 % EX PADS
6.0000 | MEDICATED_PAD | Freq: Once | CUTANEOUS | Status: AC
  Administered 2017-02-01: 6 via TOPICAL

## 2017-01-31 NOTE — Consult Note (Signed)
Reason for Consult: Right upper quadrant abdominal pain Referring Physician: Dr. Josph Macho is an 21 y.o. female.  HPI: Patient is a 21 year old morbidly obese black female who presented emergency room with worsening right upper quadrant abdominal pain and nausea. She has had an extensive workup over the past year for her abdominal pain. CT scan the abdomen revealed physis as well as retroperitoneal adipose tissue changes. She was admitted to the hospital for further evaluation and treatment. A HIDA scan reveals nonfilling of the gallbladder. Patient states she had 4 out of 10 pain intermittently with this. She denies any jaundice. She has had intermittent fatty food intolerance.  Past Medical History:  Diagnosis Date  . Abdominal pain, chronic, right upper quadrant   . Abnormal Pap smear of cervix   . Anemia    iron deficiency  . Anxiety   . Depression   . Headache(784.0)   . PONV (postoperative nausea and vomiting)    Hx: of nausea only at age 42    Past Surgical History:  Procedure Laterality Date  . COLONOSCOPY N/A 01/07/2016   Dr. Oneida Alar: Nonbleeding internal hemorrhoids, large. 3 bands successfully placed. Distal ileum containing multiple ulcers which she felt was related to NSAIDs. Biopsies nonspecific.  Marland Kitchen HEMORRHOID BANDING N/A 01/07/2016   Procedure: HEMORRHOID BANDING;  Surgeon: Danie Binder, MD;  Location: [facility];  Service: Endoscopy;  Laterality: N/A;  . MASS EXCISION N/A 11/26/2016   Procedure: EXCISION OF ABDOMINAL CICATRIX 4CM;  Surgeon: Aviva Signs, MD;  Location: [facility];  Service: General;  Laterality: N/A;  p knows to arrive at 7:50  . NASAL SEPTOPLASTY W/ TURBINOPLASTY Bilateral 06/24/2013   Procedure: TURBINATE REDUCTION;  Surgeon: Jodi Marble, MD;  Location: [facility];  Service: ENT;  Laterality: Bilateral;  . TONSILLECTOMY AND ADENOIDECTOMY Bilateral 06/24/2013   Procedure: TONSILLECTOMY ;  Surgeon: Jodi Marble, MD;  Location: [facility];  Service:  ENT;  Laterality: Bilateral;  . TUMOR EXCISION     Hx: of right side of neck  . WISDOM TOOTH EXTRACTION      Family History  Problem Relation Age of Onset  . Cancer - Other Mother   . Arthritis Mother   . Hypertension Father   . COPD Father   . Cancer - Cervical Other   . Diabetes Other   . Stroke Other     Social History:  reports that she quit smoking about 3 months ago. Her smoking use included Cigarettes. She quit after 1.00 year of use. She has never used smokeless tobacco. She reports that she drinks alcohol. She reports that she uses drugs, including Marijuana, about 7 times per week.  Allergies: No Known Allergies  Medications:  Scheduled: . escitalopram  20 mg Oral Daily  . norgestimate-ethinyl estradiol  1 tablet Oral Daily    Results for orders placed or performed during the hospital encounter of 01/29/17 (from the past 48 hour(s))  Urinalysis, Routine w reflex microscopic     Status: None   Collection Time: 01/29/17 10:51 AM  Result Value Ref Range   Color, Urine YELLOW YELLOW   APPearance CLEAR CLEAR   Specific Gravity, Urine 1.015 1.005 - 1.030   pH 8.0 5.0 - 8.0   Glucose, UA NEGATIVE NEGATIVE mg/dL   Hgb urine dipstick NEGATIVE NEGATIVE   Bilirubin Urine NEGATIVE NEGATIVE   Ketones, ur NEGATIVE NEGATIVE mg/dL   Protein, ur NEGATIVE NEGATIVE mg/dL   Nitrite NEGATIVE NEGATIVE   Leukocytes, UA NEGATIVE NEGATIVE  Comment: Microscopic not done on urines with negative protein, blood, leukocytes, nitrite, or glucose < 500 mg/dL.  Pregnancy, urine     Status: None   Collection Time: 01/29/17 10:51 AM  Result Value Ref Range   Preg Test, Ur NEGATIVE NEGATIVE    Comment:        THE SENSITIVITY OF THIS METHODOLOGY IS >20 mIU/mL.   Lipase, blood     Status: None   Collection Time: 01/29/17 11:24 AM  Result Value Ref Range   Lipase 20 11 - 51 U/L  Comprehensive metabolic panel     Status: Abnormal   Collection Time: 01/29/17 11:24 AM  Result Value Ref  Range   Sodium 135 135 - 145 mmol/L   Potassium 3.8 3.5 - 5.1 mmol/L   Chloride 103 101 - 111 mmol/L   CO2 21 (L) 22 - 32 mmol/L   Glucose, Bld 96 65 - 99 mg/dL   BUN 10 6 - 20 mg/dL   Creatinine, Ser 0.71 0.44 - 1.00 mg/dL   Calcium 9.0 8.9 - 10.3 mg/dL   Total Protein 7.7 6.5 - 8.1 g/dL   Albumin 3.9 3.5 - 5.0 g/dL   AST 26 15 - 41 U/L   ALT 21 14 - 54 U/L   Alkaline Phosphatase 36 (L) 38 - 126 U/L   Total Bilirubin 0.9 0.3 - 1.2 mg/dL   GFR calc non Af Amer >60 >60 mL/min   GFR calc Af Amer >60 >60 mL/min    Comment: (NOTE) The eGFR has been calculated using the CKD EPI equation. This calculation has not been validated in all clinical situations. eGFR's persistently <60 mL/min signify possible Chronic Kidney Disease.    Anion gap 11 5 - 15  CBC with Differential     Status: Abnormal   Collection Time: 01/29/17 11:24 AM  Result Value Ref Range   WBC 14.1 (H) 4.0 - 10.5 K/uL   RBC 5.70 (H) 3.87 - 5.11 MIL/uL   Hemoglobin 13.1 12.0 - 15.0 g/dL   HCT 40.3 36.0 - 46.0 %   MCV 70.7 (L) 78.0 - 100.0 fL   MCH 23.0 (L) 26.0 - 34.0 pg   MCHC 32.5 30.0 - 36.0 g/dL   RDW 15.7 (H) 11.5 - 15.5 %   Platelets 281 150 - 400 K/uL   Neutrophils Relative % 66 %   Neutro Abs 9.3 (H) 1.7 - 7.7 K/uL   Lymphocytes Relative 24 %   Lymphs Abs 3.4 0.7 - 4.0 K/uL   Monocytes Relative 9 %   Monocytes Absolute 1.3 (H) 0.1 - 1.0 K/uL   Eosinophils Relative 1 %   Eosinophils Absolute 0.2 0.0 - 0.7 K/uL   Basophils Relative 0 %   Basophils Absolute 0.0 0.0 - 0.1 K/uL  HIV antibody (Routine Testing)     Status: None   Collection Time: 01/29/17  6:00 PM  Result Value Ref Range   HIV Screen 4th Generation wRfx Non Reactive Non Reactive    Comment: (NOTE) Performed At: [facility] [place], [place] [id number] Lindon Romp MD [id number]   Basic metabolic panel     Status: Abnormal   Collection Time: 01/30/17  6:32 AM  Result Value Ref Range   Sodium 134 (L) 135 -  145 mmol/L   Potassium 3.7 3.5 - 5.1 mmol/L   Chloride 103 101 - 111 mmol/L   CO2 24 22 - 32 mmol/L   Glucose, Bld 101 (H) 65 - 99 mg/dL  BUN 7 6 - 20 mg/dL   Creatinine, Ser 0.70 0.44 - 1.00 mg/dL   Calcium 8.8 (L) 8.9 - 10.3 mg/dL   GFR calc non Af Amer >60 >60 mL/min   GFR calc Af Amer >60 >60 mL/min    Comment: (NOTE) The eGFR has been calculated using the CKD EPI equation. This calculation has not been validated in all clinical situations. eGFR's persistently <60 mL/min signify possible Chronic Kidney Disease.    Anion gap 7 5 - 15  CBC     Status: Abnormal   Collection Time: 01/30/17  6:32 AM  Result Value Ref Range   WBC 14.6 (H) 4.0 - 10.5 K/uL   RBC 5.28 (H) 3.87 - 5.11 MIL/uL   Hemoglobin 12.0 12.0 - 15.0 g/dL   HCT 37.6 36.0 - 46.0 %   MCV 71.2 (L) 78.0 - 100.0 fL   MCH 22.7 (L) 26.0 - 34.0 pg   MCHC 31.9 30.0 - 36.0 g/dL   RDW 15.5 11.5 - 15.5 %   Platelets 280 150 - 400 K/uL  CBC with Differential/Platelet     Status: Abnormal   Collection Time: 01/31/17  6:04 AM  Result Value Ref Range   WBC 9.4 4.0 - 10.5 K/uL   RBC 5.02 3.87 - 5.11 MIL/uL   Hemoglobin 11.4 (L) 12.0 - 15.0 g/dL   HCT 36.2 36.0 - 46.0 %   MCV 72.1 (L) 78.0 - 100.0 fL   MCH 22.7 (L) 26.0 - 34.0 pg   MCHC 31.5 30.0 - 36.0 g/dL   RDW 15.6 (H) 11.5 - 15.5 %   Platelets 274 150 - 400 K/uL   Neutrophils Relative % 54 %   Neutro Abs 5.1 1.7 - 7.7 K/uL   Lymphocytes Relative 31 %   Lymphs Abs 2.9 0.7 - 4.0 K/uL   Monocytes Relative 11 %   Monocytes Absolute 1.0 0.1 - 1.0 K/uL   Eosinophils Relative 4 %   Eosinophils Absolute 0.3 0.0 - 0.7 K/uL   Basophils Relative 0 %   Basophils Absolute 0.0 0.0 - 0.1 K/uL  Basic metabolic panel     Status: Abnormal   Collection Time: 01/31/17  6:04 AM  Result Value Ref Range   Sodium 136 135 - 145 mmol/L   Potassium 3.6 3.5 - 5.1 mmol/L   Chloride 104 101 - 111 mmol/L   CO2 23 22 - 32 mmol/L   Glucose, Bld 87 65 - 99 mg/dL   BUN 7 6 - 20 mg/dL    Creatinine, Ser 0.63 0.44 - 1.00 mg/dL   Calcium 8.5 (L) 8.9 - 10.3 mg/dL   GFR calc non Af Amer >60 >60 mL/min   GFR calc Af Amer >60 >60 mL/min    Comment: (NOTE) The eGFR has been calculated using the CKD EPI equation. This calculation has not been validated in all clinical situations. eGFR's persistently <60 mL/min signify possible Chronic Kidney Disease.    Anion gap 9 5 - 15    Nm Hepatobiliary Liver Func  Result Date: 01/30/2017 CLINICAL DATA:  Chronic RIGHT upper quadrant pain worsening over 5 days, vomiting, nausea, cholelithiasis EXAM: NUCLEAR MEDICINE HEPATOBILIARY IMAGING TECHNIQUE: Sequential images of the abdomen were obtained out to 60 minutes following intravenous administration of radiopharmaceutical. RADIOPHARMACEUTICALS:  5.5 mCi Tc-59m Choletec IV COMPARISON:  12/21/2015 Correlation: CT abdomen and pelvis 01/29/2017 FINDINGS: Normal tracer clearance from bloodstream indicating normal hepatocellular function. Prompt excretion of tracer into biliary tree. Small bowel visualized by 20 minutes. At  1 hour, gallbladder had not visualized. Relative photopenic area identified at the position of the gallbladder based on anatomy as demonstrated by coronal CT images. Patient was then injected with 4 mg of morphine sulfate IV and imaging was continued for an additional 60 minutes. Gallbladder did not visualize following morphine augmentation. Findings are consistent with cystic duct obstruction and acute cholecystitis. IMPRESSION: Nonvisualization of the gallbladder despite morphine augmentation consistent with acute cholecystitis. Normal hepatocellular function with patent CBD. Electronically Signed   By: Lavonia Dana M.D.   On: 01/30/2017 11:07   Ct Abdomen Pelvis W Contrast  Addendum Date: 01/29/2017   ADDENDUM REPORT: 01/29/2017 14:10 ADDENDUM: Prior chest CT from October 17, 2005 does not extend inferiorly through the area of mesenteric thickening. No mesenteric inflammation is noted at  that time along the superior aspect of the area of current inflammation. Mild prominence of a retroperitoneal lymph node on the left seen on the 2007 study appears essentially stable at this time. Other mildly prominent lymph nodes are seen slightly inferior to the caudal extent of imaging from the 2007 study. Electronically Signed   By: Lowella Grip III M.D.   On: 01/29/2017 14:10   Result Date: 01/29/2017 CLINICAL DATA:  Right upper quadrant pain, chronic EXAM: CT ABDOMEN AND PELVIS WITH CONTRAST TECHNIQUE: Multidetector CT imaging of the abdomen and pelvis was performed using the standard protocol following bolus administration of intravenous contrast. CONTRAST:  152m ISOVUE-300 IOPAMIDOL (ISOVUE-300) INJECTION 61% COMPARISON:  None. FINDINGS: Lower chest: There is mild atelectatic change in superior segment of the right low there are areas of apparent scarring in the anterior abdominal wall. There is a small Er lobe. Visualized lung bases are otherwise clear. Hepatobiliary: Liver measures 20.0 cm in length. No focal liver lesions are evident. Gallbladder is somewhat distended with cholelithiasis in the dependent portion. No gallbladder wall thickening. No biliary duct dilatation. Pancreas: No pancreatic mass or inflammatory focus. Spleen: No splenic lesions evident. Adrenals/Urinary Tract: Adrenals appear normal bilaterally. Kidneys bilaterally show no evident mass or hydronephrosis on either side. No renal or ureteral calculus evident on either side. Urinary bladder is midline with wall thickness within normal limits. Stomach/Bowel: There is no appreciable bowel wall or mesenteric thickening. No bowel obstruction. No free air or portal venous air. Vascular/Lymphatic: There is no abdominal aortic aneurysm. Major mesenteric arterial vessels appear patent. There is thickening of the mesenteric in the retroperitoneum at the level of the inferior vena cava inferior to the liver with thickening of the  retroperitoneum surrounding the inferior vena cava and extending to the level of the aorta at the mid kidney level. Several small lymph nodes are noted in this area. The inferior vena cava does not appear thrombosed. Beyond the lymph nodes seen in the areas of mesenteric thickening in the retroperitoneum at the level of the inferior vena cava, there is no adenopathy appreciable in the abdomen or pelvis. Reproductive: Uterus is anteverted. No pelvic mass or pelvic fluid collection. Other: Appendix appears unremarkable. No abscess or ascites evident in the abdomen or pelvis. There is a small ventral hernia containing only fat. There are areas of scarring in the anterior abdominal wall. No abdominal wall fluid collection evident. Musculoskeletal: There are no blastic or lytic bone lesions. No intramuscular lesions are evident. IMPRESSION: 1. Mesenteric and fat thickening and stranding in the retroperitoneum centered at the level of the infrahepatic inferior vena cava but extending posteriorly and to the left to surround a portion of the aorta. Inferiorly, inflammation  terminates at the approximate level of the junction of the common femoral veins with the inferior vena cava. There is no thickening in either perinephric fascia region. There are several mildly prominent lymph nodes in this area. 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 It should be noted that atypical lymphoma could present in this manner. A retroperitoneal sarcoma, while rare, could also present in this manner. There is no appreciable thrombus in the inferior vena cava. Inflammation does not appear to arise from the inferior vena cava itself. It may be reasonable to consider nuclear medicine PET study to assess degree of metabolic activity in this area. 2. Cholelithiasis. Gallbladder mildly distended. No gallbladder wall thickening. 3.  No bowel obstruction.  No abscess.   Appendix appears normal. 4.  Scarring anterior abdominal wall.  No fluid in this area. Electronically Signed: By: Lowella Grip III M.D. On: 01/29/2017 13:53    ROS:  Pertinent items are noted in HPI.  Blood pressure 135/72, pulse (!) 59, temperature 98.9 F (37.2 C), temperature source Oral, resp. rate 18, height 5' 8.5" (1.74 m), weight (!) 338 lb 11.2 oz (153.6 kg), last menstrual period 01/08/2017, SpO2 99 %. Physical Exam: Morbidly obese black female in no acute distress. Head is normocephalic, atraumatic Eyes without scleral icterus Neck is supple Lungs clear auscultation with equal breath sounds bilaterally Heart examination reveals a regular rate and rhythm without S3, S4, murmurs Abdomen is soft with mild tenderness to deep palpation in the right upper quadrant. No rigidity noted. No hernias noted. CT scan and HIDA scan reports reviewed.  Assessment/Plan: Impression: Cholecystitis, cholelithiasis, morbid obesity Plan: Will take patient to the operating room on 02/02/2017 for laparoscopic cholecystectomy. The risks and benefits of the procedure including bleeding, infection, hepatobiliary injury, the possibility of an open procedure were fully explained to the patient, who gave informed consent.  Aviva Signs 01/31/2017, 9:18 AM

## 2017-01-31 NOTE — Progress Notes (Signed)
PROGRESS NOTE    Jacqueline Alvarado  ZOX:[id number] DOB: 03-Jun-1996 DOA: 01/29/2017 PCP: Gareth Morgan, MD     Brief Narrative:  21 year old female presents with right upper quadrant abdominal pain. Patient is known to have history of depression/anxiety, headaches. Presents with intermittent right upper quadrant pain, worse with meals, it has been more persistent over last 24 hours, associated with abdominal distention, no improving factors. On initial physical examination blood pressure 143/92, heart rate 68, oxygen saturation 100%. Moist mucous membranes, lungs clear to auscultation bilaterally, heart S1-S2 present rhythmic, the abdomen soft, tender in the right upper quadrant, positive Murphy sign, no rebound or guarding. No lower extremity edema. Sodium 135, potassium 3.8, chloride 103, bicarbonate 21, glucose 96, BUN 10, creatinine 0.71, white count 14.1, hemo-13.1, hematocrit 40.3, platelets 281, urinalysis negative for infection, negative pregnancy test. CT of the abdomen with mesenteric and fat thickening and stranding in the retroperitoneum. Several mildly prominent lymph nodes. Cholelithiasis, gallbladder mildly distended. No gallbladder wall thickening.  Patient admitted to the hospital working diagnosis of right upper quadrant abdominal pain rule out biliary colic.   Assessment & Plan:   Principal Problem:   RUQ pain Active Problems:   Morbid obesity (HCC)   Biliary colic   Calculus of gallbladder with acute cholecystitis without obstruction   1. Suspected biliary colic. Plan for surgical intervention on 07/23. Will continue gentle IV fluids, clear liquid diet, IV analgesics, antiacids and antiemetics. Case discussed with Dr Lovell Sheehan from surgery.   2. Morbid obesity.  Continue mobilization, dvt prophylaxis.   3. Leukocytosis. Wbc down to 9,4, will continue to follow on cell count in am. No signs of systemic infection.    DVT prophylaxis: enoxaparin Code Status:  full Family Communication: no family at the bedside  Disposition Plan: home   Consultants:   GI   Surgery  Procedures:   Antimicrobials  Subjective: Positive abdominal pain with meals, no nausea or vomiting, pain improved with analgesics  Objective: Vitals:   01/29/17 2116 01/30/17 0628 01/30/17 2150 01/31/17 0519  BP: 127/63 (!) 126/52 131/65 135/72  Pulse: (!) 51 63 61 (!) 59  Resp: 18 18 18    Temp: 98.4 F (36.9 C) 99 F (37.2 C) 98.7 F (37.1 C) 98.9 F (37.2 C)  TempSrc: Oral Oral Oral Oral  SpO2: 100% 100% 99% 99%  Weight:      Height:        Intake/Output Summary (Last 24 hours) at 01/31/17 1219 Last data filed at 01/31/17 0800  Gross per 24 hour  Intake             1905 ml  Output                0 ml  Net             1905 ml   Filed Weights   01/29/17 1047 01/29/17 1849  Weight: (!) 153.8 kg (339 lb) (!) 153.6 kg (338 lb 11.2 oz)    Examination:  General exam: not in pain or deconditioned E ENT: no pallor or icterus  Respiratory system: Clear to auscultation. Respiratory effort normal. No wheezing, rales or rhonchi.  Cardiovascular system: S1 & S2 heard, RRR. No JVD, murmurs, rubs, gallops or clicks. No pedal edema. Gastrointestinal system: Abdomen is nondistended, soft and nontender. No organomegaly or masses felt. Normal bowel sounds heard. Central nervous system: Alert and oriented. No focal neurological deficits. Extremities: Symmetric 5 x 5 power. Skin: No rashes, lesions or ulcers  Data Reviewed: I have personally reviewed following labs and imaging studies  CBC:  Recent Labs Lab 01/29/17 1124 01/30/17 0632 01/31/17 0604  WBC 14.1* 14.6* 9.4  NEUTROABS 9.3*  --  5.1  HGB 13.1 12.0 11.4*  HCT 40.3 37.6 36.2  MCV 70.7* 71.2* 72.1*  PLT 281 280 274   Basic Metabolic Panel:  Recent Labs Lab 01/29/17 1124 01/30/17 0632 01/31/17 0604  NA 135 134* 136  K 3.8 3.7 3.6  CL 103 103 104  CO2 21* 24 23  GLUCOSE 96 101*  87  BUN 10 7 7   CREATININE 0.71 0.70 0.63  CALCIUM 9.0 8.8* 8.5*   GFR: Estimated Creatinine Clearance: 173.5 mL/min (by C-G formula based on SCr of 0.63 mg/dL). Liver Function Tests:  Recent Labs Lab 01/29/17 1124  AST 26  ALT 21  ALKPHOS 36*  BILITOT 0.9  PROT 7.7  ALBUMIN 3.9    Recent Labs Lab 01/29/17 1124  LIPASE 20   No results for input(s): AMMONIA in the last 168 hours. Coagulation Profile: No results for input(s): INR, PROTIME in the last 168 hours. Cardiac Enzymes: No results for input(s): CKTOTAL, CKMB, CKMBINDEX, TROPONINI in the last 168 hours. BNP (last 3 results) No results for input(s): PROBNP in the last 8760 hours. HbA1C: No results for input(s): HGBA1C in the last 72 hours. CBG: No results for input(s): GLUCAP in the last 168 hours. Lipid Profile: No results for input(s): CHOL, HDL, LDLCALC, TRIG, CHOLHDL, LDLDIRECT in the last 72 hours. Thyroid Function Tests: No results for input(s): TSH, T4TOTAL, FREET4, T3FREE, THYROIDAB in the last 72 hours. Anemia Panel: No results for input(s): VITAMINB12, FOLATE, FERRITIN, TIBC, IRON, RETICCTPCT in the last 72 hours. Sepsis Labs: No results for input(s): PROCALCITON, LATICACIDVEN in the last 168 hours.  No results found for this or any previous visit (from the past 240 hour(s)).       Radiology Studies: Nm Hepatobiliary Liver Func  Result Date: 01/30/2017 CLINICAL DATA:  Chronic RIGHT upper quadrant pain worsening over 5 days, vomiting, nausea, cholelithiasis EXAM: NUCLEAR MEDICINE HEPATOBILIARY IMAGING TECHNIQUE: Sequential images of the abdomen were obtained out to 60 minutes following intravenous administration of radiopharmaceutical. RADIOPHARMACEUTICALS:  5.5 mCi Tc-44m  Choletec IV COMPARISON:  12/21/2015 Correlation: CT abdomen and pelvis 01/29/2017 FINDINGS: Normal tracer clearance from bloodstream indicating normal hepatocellular function. Prompt excretion of tracer into biliary tree. Small  bowel visualized by 20 minutes. At 1 hour, gallbladder had not visualized. Relative photopenic area identified at the position of the gallbladder based on anatomy as demonstrated by coronal CT images. Patient was then injected with 4 mg of morphine sulfate IV and imaging was continued for an additional 60 minutes. Gallbladder did not visualize following morphine augmentation. Findings are consistent with cystic duct obstruction and acute cholecystitis. IMPRESSION: Nonvisualization of the gallbladder despite morphine augmentation consistent with acute cholecystitis. Normal hepatocellular function with patent CBD. Electronically Signed   By: Ulyses Southward M.D.   On: 01/30/2017 11:07   Ct Abdomen Pelvis W Contrast  Addendum Date: 01/29/2017   ADDENDUM REPORT: 01/29/2017 14:10 ADDENDUM: Prior chest CT from October 17, 2005 does not extend inferiorly through the area of mesenteric thickening. No mesenteric inflammation is noted at that time along the superior aspect of the area of current inflammation. Mild prominence of a retroperitoneal lymph node on the left seen on the 2007 study appears essentially stable at this time. Other mildly prominent lymph nodes are seen slightly inferior to the caudal extent of imaging from  the 2007 study. Electronically Signed   By: Bretta Bang III M.D.   On: 01/29/2017 14:10   Result Date: 01/29/2017 CLINICAL DATA:  Right upper quadrant pain, chronic EXAM: CT ABDOMEN AND PELVIS WITH CONTRAST TECHNIQUE: Multidetector CT imaging of the abdomen and pelvis was performed using the standard protocol following bolus administration of intravenous contrast. CONTRAST:  ISOVUE-300 IOPAMIDOL (ISOVUE-300) INJECTION 61% COMPARISON:  None. FINDINGS: Lower chest: There is mild atelectatic change in superior segment of the right low there are areas of apparent scarring in the anterior abdominal wall. There is a small Er lobe. Visualized lung bases are otherwise clear. Hepatobiliary: Liver  measures 20.0 cm in length. No focal liver lesions are evident. Gallbladder is somewhat distended with cholelithiasis in the dependent portion. No gallbladder wall thickening. No biliary duct dilatation. Pancreas: No pancreatic mass or inflammatory focus. Spleen: No splenic lesions evident. Adrenals/Urinary Tract: Adrenals appear normal bilaterally. Kidneys bilaterally show no evident mass or hydronephrosis on either side. No renal or ureteral calculus evident on either side. Urinary bladder is midline with wall thickness within normal limits. Stomach/Bowel: There is no appreciable bowel wall or mesenteric thickening. No bowel obstruction. No free air or portal venous air. Vascular/Lymphatic: There is no abdominal aortic aneurysm. Major mesenteric arterial vessels appear patent. There is thickening of the mesenteric in the retroperitoneum at the level of the inferior vena cava inferior to the liver with thickening of the retroperitoneum surrounding the inferior vena cava and extending to the level of the aorta at the mid kidney level. Several small lymph nodes are noted in this area. The inferior vena cava does not appear thrombosed. Beyond the lymph nodes seen in the areas of mesenteric thickening in the retroperitoneum at the level of the inferior vena cava, there is no adenopathy appreciable in the abdomen or pelvis. Reproductive: Uterus is anteverted. No pelvic mass or pelvic fluid collection. Other: Appendix appears unremarkable. No abscess or ascites evident in the abdomen or pelvis. There is a small ventral hernia containing only fat. There are areas of scarring in the anterior abdominal wall. No abdominal wall fluid collection evident. Musculoskeletal: There are no blastic or lytic bone lesions. No intramuscular lesions are evident. IMPRESSION: 1. Mesenteric and fat thickening and stranding in the retroperitoneum centered at the level of the infrahepatic inferior vena cava but extending posteriorly and to  the left to surround a portion of the aorta. Inferiorly, inflammation terminates at the approximate level of the junction of the common femoral veins with the inferior vena cava. There is no thickening in either perinephric fascia region. There are several mildly prominent lymph nodes in this area. 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 It should be noted that atypical lymphoma could present in this manner. A retroperitoneal sarcoma, while rare, could also present in this manner. There is no appreciable thrombus in the inferior vena cava. Inflammation does not appear to arise from the inferior vena cava itself. It may be reasonable to consider nuclear medicine PET study to assess degree of metabolic activity in this area. 2. Cholelithiasis. Gallbladder mildly distended. No gallbladder wall thickening. 3.  No bowel obstruction.  No abscess.  Appendix appears normal. 4.  Scarring anterior abdominal wall.  No fluid in this area. Electronically Signed: By: Bretta Bang III M.D. On: 01/29/2017 13:53        Scheduled Meds: . escitalopram  20 mg Oral Daily  . norgestimate-ethinyl estradiol  1  tablet Oral Daily   Continuous Infusions: . sodium chloride 75 mL/hr at 01/31/17 0625     LOS: 0 days       Coralie KeensMauricio Daniel Arrien, MD [facility] Pager [phone]  If 7PM-7AM, please contact night-coverage [email or web address] Password [email or web address] 01/31/2017, 12:19 PM

## 2017-02-01 DIAGNOSIS — K8000 Calculus of gallbladder with acute cholecystitis without obstruction: Secondary | ICD-10-CM | POA: Diagnosis present

## 2017-02-01 DIAGNOSIS — Z6841 Body Mass Index (BMI) 40.0 and over, adult: Secondary | ICD-10-CM | POA: Diagnosis not present

## 2017-02-01 DIAGNOSIS — K8050 Calculus of bile duct without cholangitis or cholecystitis without obstruction: Secondary | ICD-10-CM | POA: Diagnosis present

## 2017-02-01 DIAGNOSIS — K821 Hydrops of gallbladder: Secondary | ICD-10-CM | POA: Diagnosis present

## 2017-02-01 DIAGNOSIS — K810 Acute cholecystitis: Secondary | ICD-10-CM | POA: Diagnosis not present

## 2017-02-01 DIAGNOSIS — K625 Hemorrhage of anus and rectum: Secondary | ICD-10-CM | POA: Diagnosis present

## 2017-02-01 DIAGNOSIS — Z79899 Other long term (current) drug therapy: Secondary | ICD-10-CM | POA: Diagnosis not present

## 2017-02-01 DIAGNOSIS — K279 Peptic ulcer, site unspecified, unspecified as acute or chronic, without hemorrhage or perforation: Secondary | ICD-10-CM | POA: Diagnosis present

## 2017-02-01 DIAGNOSIS — Z87891 Personal history of nicotine dependence: Secondary | ICD-10-CM | POA: Diagnosis not present

## 2017-02-01 MED ORDER — HYDROMORPHONE HCL 1 MG/ML IJ SOLN
1.0000 mg | INTRAMUSCULAR | Status: DC | PRN
  Administered 2017-02-01 – 2017-02-03 (×14): 1 mg via INTRAVENOUS
  Filled 2017-02-01 (×15): qty 1

## 2017-02-01 NOTE — Plan of Care (Signed)
Problem: Nutrition: Goal: Adequate nutrition will be maintained Outcome: Progressing Patient scheduled for gallbladder surgery

## 2017-02-01 NOTE — Progress Notes (Signed)
Patient has c/o pain during the night.  Patient has had her pain medicine prn, and pain has not subsided.  Patient has had a restless night due to pain. Patient will speak to MD about pain control.

## 2017-02-01 NOTE — Progress Notes (Signed)
  Subjective: Patient still having intermittent right upper quadrant abdominal pain.  Objective: Vital signs in last 24 hours: Temp:  [98.6 F (37 C)-99.2 F (37.3 C)] 99.2 F (37.3 C) (07/22 0625) Pulse Rate:  [52-53] 53 (07/22 0625) Resp:  [20] 20 (07/22 0625) BP: (108-135)/(51-57) 108/51 (07/22 0625) SpO2:  [100 %] 100 % (07/22 0625) Last BM Date: 01/27/17  Intake/Output from previous day: 07/21 0701 - 07/22 0700 In: 2177.5 [P.O.:720; I.V.:1457.5] Out: -  Intake/Output this shift: No intake/output data recorded.  General appearance: alert, cooperative and no distress GI: Soft with mild right upper quadrant discomfort to palpation. No rigidity noted.  Lab Results:   Recent Labs  01/30/17 0632 01/31/17 0604  WBC 14.6* 9.4  HGB 12.0 11.4*  HCT 37.6 36.2  PLT 280 274   BMET  Recent Labs  01/30/17 0632 01/31/17 0604  NA 134* 136  K 3.7 3.6  CL 103 104  CO2 24 23  GLUCOSE 101* 87  BUN 7 7  CREATININE 0.70 0.63  CALCIUM 8.8* 8.5*   PT/INR No results for input(s): LABPROT, INR in the last 72 hours.  Studies/Results: Nm Hepatobiliary Liver Func  Result Date: 01/30/2017 CLINICAL DATA:  Chronic RIGHT upper quadrant pain worsening over 5 days, vomiting, nausea, cholelithiasis EXAM: NUCLEAR MEDICINE HEPATOBILIARY IMAGING TECHNIQUE: Sequential images of the abdomen were obtained out to 60 minutes following intravenous administration of radiopharmaceutical. RADIOPHARMACEUTICALS:  5.5 mCi Tc-99m  Choletec IV COMPARISON:  12/21/2015 Correlation: CT abdomen and pelvis 01/29/2017 FINDINGS: Normal tracer clearance from bloodstream indicating normal hepatocellular function. Prompt excretion of tracer into biliary tree. Small bowel visualized by 20 minutes. At 1 hour, gallbladder had not visualized. Relative photopenic area identified at the position of the gallbladder based on anatomy as demonstrated by coronal CT images. Patient was then injected with 4 mg of morphine  sulfate IV and imaging was continued for an additional 60 minutes. Gallbladder did not visualize following morphine augmentation. Findings are consistent with cystic duct obstruction and acute cholecystitis. IMPRESSION: Nonvisualization of the gallbladder despite morphine augmentation consistent with acute cholecystitis. Normal hepatocellular function with patent CBD. Electronically Signed   By: Mischell Branford  Boles M.D.   On: 01/30/2017 11:07    Anti-infectives: Anti-infectives    None      Assessment/Plan: Impression: Cholecystitis, cholelithiasis Plan: Will take patient to the operating room tomorrow for laparoscopic cholecystectomy. The risks and benefits of the procedure including bleeding, infection, hepatobiliary injury, and the possibility of an open procedure were fully explained to the patient, who gave informed consent.  LOS: 0 days    Jacqueline Alvarado 02/01/2017 

## 2017-02-02 ENCOUNTER — Encounter (HOSPITAL_COMMUNITY)
Admission: EM | Disposition: A | Discharge status: Home / Self Care | Payer: Self-pay | Source: Home / Self Care | Attending: Internal Medicine

## 2017-02-02 ENCOUNTER — Inpatient Hospital Stay (HOSPITAL_COMMUNITY): Payer: BLUE CROSS/BLUE SHIELD | Admitting: Anesthesiology

## 2017-02-02 ENCOUNTER — Encounter (HOSPITAL_COMMUNITY): Payer: Self-pay | Admitting: *Deleted

## 2017-02-02 HISTORY — PX: CHOLECYSTECTOMY: SHX55

## 2017-02-02 LAB — SURGICAL PCR SCREEN
MRSA, PCR: NEGATIVE
Staphylococcus aureus: NEGATIVE

## 2017-02-02 SURGERY — LAPAROSCOPIC CHOLECYSTECTOMY
Anesthesia: General

## 2017-02-02 MED ORDER — ENOXAPARIN SODIUM 40 MG/0.4ML ~~LOC~~ SOLN
40.0000 mg | Freq: Once | SUBCUTANEOUS | Status: AC
  Administered 2017-02-02: 40 mg via SUBCUTANEOUS
  Filled 2017-02-02: qty 0.4

## 2017-02-02 MED ORDER — CIPROFLOXACIN IN D5W 400 MG/200ML IV SOLN
400.0000 mg | Freq: Two times a day (BID) | INTRAVENOUS | Status: DC
  Administered 2017-02-02 – 2017-02-03 (×2): 400 mg via INTRAVENOUS
  Filled 2017-02-02 (×2): qty 200

## 2017-02-02 MED ORDER — EPHEDRINE SULFATE 50 MG/ML IJ SOLN
INTRAMUSCULAR | Status: DC | PRN
  Administered 2017-02-02: 10 mg via INTRAVENOUS

## 2017-02-02 MED ORDER — HEMOSTATIC AGENTS (NO CHARGE) OPTIME
TOPICAL | Status: DC | PRN
  Administered 2017-02-02: 2 via TOPICAL

## 2017-02-02 MED ORDER — ONDANSETRON HCL 4 MG/2ML IJ SOLN
4.0000 mg | Freq: Once | INTRAMUSCULAR | Status: AC
  Administered 2017-02-02: 4 mg via INTRAVENOUS

## 2017-02-02 MED ORDER — ONDANSETRON HCL 4 MG/2ML IJ SOLN
4.0000 mg | Freq: Four times a day (QID) | INTRAMUSCULAR | Status: DC | PRN
  Administered 2017-02-03: 4 mg via INTRAVENOUS
  Filled 2017-02-02: qty 2

## 2017-02-02 MED ORDER — DIPHENHYDRAMINE HCL 25 MG PO CAPS: 25.0000 mg | ORAL_CAPSULE | Freq: Four times a day (QID) | ORAL | Status: DC | PRN

## 2017-02-02 MED ORDER — GLYCOPYRROLATE 0.2 MG/ML IJ SOLN
INTRAMUSCULAR | Status: DC | PRN
  Administered 2017-02-02: .6 mg via INTRAVENOUS

## 2017-02-02 MED ORDER — GLYCOPYRROLATE 0.2 MG/ML IJ SOLN
0.2000 mg | Freq: Once | INTRAMUSCULAR | Status: AC
  Administered 2017-02-02: 0.2 mg via INTRAVENOUS

## 2017-02-02 MED ORDER — SCOPOLAMINE 1 MG/3DAYS TD PT72
1.0000 | MEDICATED_PATCH | Freq: Once | TRANSDERMAL | Status: DC
  Administered 2017-02-02: 1.5 mg via TRANSDERMAL

## 2017-02-02 MED ORDER — SEVOFLURANE IN SOLN
RESPIRATORY_TRACT | Status: AC
  Filled 2017-02-02: qty 250

## 2017-02-02 MED ORDER — GLYCOPYRROLATE 0.2 MG/ML IJ SOLN
INTRAMUSCULAR | Status: AC
  Filled 2017-02-02: qty 1

## 2017-02-02 MED ORDER — FENTANYL CITRATE (PF) 250 MCG/5ML IJ SOLN
INTRAMUSCULAR | Status: AC
  Filled 2017-02-02: qty 5

## 2017-02-02 MED ORDER — EPHEDRINE SULFATE 50 MG/ML IJ SOLN
INTRAMUSCULAR | Status: AC
  Filled 2017-02-02: qty 1

## 2017-02-02 MED ORDER — FENTANYL CITRATE (PF) 100 MCG/2ML IJ SOLN
INTRAMUSCULAR | Status: DC | PRN
  Administered 2017-02-02 (×7): 50 ug via INTRAVENOUS

## 2017-02-02 MED ORDER — BUPIVACAINE HCL (PF) 0.5 % IJ SOLN
INTRAMUSCULAR | Status: AC
  Filled 2017-02-02: qty 30

## 2017-02-02 MED ORDER — LIDOCAINE HCL (PF) 1 % IJ SOLN
INTRAMUSCULAR | Status: AC
  Filled 2017-02-02: qty 5

## 2017-02-02 MED ORDER — FENTANYL CITRATE (PF) 100 MCG/2ML IJ SOLN: 25.0000 ug | INTRAMUSCULAR | Status: DC | PRN

## 2017-02-02 MED ORDER — DEXAMETHASONE SODIUM PHOSPHATE 4 MG/ML IJ SOLN
4.0000 mg | Freq: Once | INTRAMUSCULAR | Status: AC
  Administered 2017-02-02: 4 mg via INTRAVENOUS

## 2017-02-02 MED ORDER — ONDANSETRON HCL 4 MG/2ML IJ SOLN
INTRAMUSCULAR | Status: AC
  Filled 2017-02-02: qty 2

## 2017-02-02 MED ORDER — DEXAMETHASONE SODIUM PHOSPHATE 4 MG/ML IJ SOLN
INTRAMUSCULAR | Status: AC
  Filled 2017-02-02: qty 1

## 2017-02-02 MED ORDER — POVIDONE-IODINE 10 % EX OINT
TOPICAL_OINTMENT | CUTANEOUS | Status: AC
  Filled 2017-02-02: qty 1

## 2017-02-02 MED ORDER — SODIUM CHLORIDE 0.9 % IV SOLN
INTRAVENOUS | Status: DC
  Administered 2017-02-02 – 2017-02-03 (×2): via INTRAVENOUS

## 2017-02-02 MED ORDER — PROPOFOL 10 MG/ML IV BOLUS
INTRAVENOUS | Status: AC
  Filled 2017-02-02: qty 20

## 2017-02-02 MED ORDER — KETOROLAC TROMETHAMINE 30 MG/ML IJ SOLN
30.0000 mg | Freq: Once | INTRAMUSCULAR | Status: AC
  Administered 2017-02-02: 30 mg via INTRAVENOUS

## 2017-02-02 MED ORDER — PROPOFOL 10 MG/ML IV BOLUS
INTRAVENOUS | Status: DC | PRN
  Administered 2017-02-02: 200 mg via INTRAVENOUS

## 2017-02-02 MED ORDER — LIDOCAINE HCL (CARDIAC) 10 MG/ML IV SOLN
INTRAVENOUS | Status: DC | PRN
  Administered 2017-02-02: 50 mg via INTRAVENOUS

## 2017-02-02 MED ORDER — MAGNESIUM HYDROXIDE 400 MG/5ML PO SUSP
30.0000 mL | Freq: Four times a day (QID) | ORAL | Status: DC | PRN
  Administered 2017-02-02 – 2017-02-03 (×2): 30 mL via ORAL
  Filled 2017-02-02 (×2): qty 30

## 2017-02-02 MED ORDER — CHLORHEXIDINE GLUCONATE CLOTH 2 % EX PADS
6.0000 | MEDICATED_PAD | Freq: Once | CUTANEOUS | Status: DC
  Administered 2017-02-02: 6 via TOPICAL

## 2017-02-02 MED ORDER — NEOSTIGMINE METHYLSULFATE 10 MG/10ML IV SOLN
INTRAVENOUS | Status: AC
  Filled 2017-02-02: qty 4

## 2017-02-02 MED ORDER — ROCURONIUM 10MG/ML (10ML) SYRINGE FOR MEDFUSION PUMP - OPTIME
INTRAVENOUS | Status: DC | PRN
  Administered 2017-02-02: 10 mg via INTRAVENOUS
  Administered 2017-02-02: 40 mg via INTRAVENOUS
  Administered 2017-02-02: 5 mg via INTRAVENOUS
  Administered 2017-02-02: 10 mg via INTRAVENOUS

## 2017-02-02 MED ORDER — SODIUM CHLORIDE 0.9 % IR SOLN
Status: DC | PRN
  Administered 2017-02-02: 3000 mL

## 2017-02-02 MED ORDER — KETOROLAC TROMETHAMINE 30 MG/ML IJ SOLN
INTRAMUSCULAR | Status: AC
  Filled 2017-02-02: qty 1

## 2017-02-02 MED ORDER — DIPHENHYDRAMINE HCL 50 MG/ML IJ SOLN: 25.0000 mg | Freq: Four times a day (QID) | INTRAMUSCULAR | Status: DC | PRN

## 2017-02-02 MED ORDER — SCOPOLAMINE 1 MG/3DAYS TD PT72
MEDICATED_PATCH | TRANSDERMAL | Status: AC
  Filled 2017-02-02: qty 1

## 2017-02-02 MED ORDER — MAGNESIUM HYDROXIDE 400 MG/5ML PO SUSP: 30.0000 mL | Freq: Four times a day (QID) | ORAL | Status: DC

## 2017-02-02 MED ORDER — NEOSTIGMINE METHYLSULFATE 10 MG/10ML IV SOLN
INTRAVENOUS | Status: DC | PRN
  Administered 2017-02-02: 3 mg via INTRAVENOUS

## 2017-02-02 MED ORDER — FENTANYL CITRATE (PF) 100 MCG/2ML IJ SOLN
INTRAMUSCULAR | Status: AC
  Filled 2017-02-02: qty 2

## 2017-02-02 MED ORDER — MIDAZOLAM HCL 2 MG/2ML IJ SOLN
INTRAMUSCULAR | Status: AC
  Filled 2017-02-02: qty 2

## 2017-02-02 MED ORDER — ROCURONIUM BROMIDE 50 MG/5ML IV SOLN
INTRAVENOUS | Status: AC
  Filled 2017-02-02: qty 1

## 2017-02-02 MED ORDER — ENOXAPARIN SODIUM 40 MG/0.4ML ~~LOC~~ SOLN: 40.0000 mg | SUBCUTANEOUS | Status: DC

## 2017-02-02 MED ORDER — SIMETHICONE 80 MG PO CHEW: 40.0000 mg | CHEWABLE_TABLET | Freq: Four times a day (QID) | ORAL | Status: DC | PRN

## 2017-02-02 MED ORDER — MIDAZOLAM HCL 2 MG/2ML IJ SOLN
1.0000 mg | INTRAMUSCULAR | Status: AC
  Administered 2017-02-02 (×2): 2 mg via INTRAVENOUS
  Filled 2017-02-02: qty 2

## 2017-02-02 MED ORDER — SODIUM CHLORIDE 0.9 % IJ SOLN
INTRAMUSCULAR | Status: AC
  Filled 2017-02-02: qty 10

## 2017-02-02 MED ORDER — LORAZEPAM 2 MG/ML IJ SOLN
1.0000 mg | INTRAMUSCULAR | Status: DC | PRN
  Administered 2017-02-03: 1 mg via INTRAVENOUS
  Filled 2017-02-02: qty 1

## 2017-02-02 MED ORDER — ONDANSETRON 4 MG PO TBDP: 4.0000 mg | ORAL_TABLET | Freq: Four times a day (QID) | ORAL | Status: DC | PRN

## 2017-02-02 MED ORDER — GLYCOPYRROLATE 0.2 MG/ML IJ SOLN
INTRAMUSCULAR | Status: AC
  Filled 2017-02-02: qty 4

## 2017-02-02 MED ORDER — LACTATED RINGERS IV SOLN
INTRAVENOUS | Status: DC
  Administered 2017-02-02: 12:00:00 via INTRAVENOUS
  Administered 2017-02-02: 1000 mL via INTRAVENOUS

## 2017-02-02 MED ORDER — SUCCINYLCHOLINE CHLORIDE 20 MG/ML IJ SOLN
INTRAMUSCULAR | Status: AC
  Filled 2017-02-02: qty 1

## 2017-02-02 MED ORDER — OXYCODONE-ACETAMINOPHEN 5-325 MG PO TABS
1.0000 | ORAL_TABLET | ORAL | Status: DC | PRN
  Administered 2017-02-02 – 2017-02-03 (×3): 2 via ORAL
  Administered 2017-02-03: 1 via ORAL
  Filled 2017-02-02: qty 2
  Filled 2017-02-02: qty 1
  Filled 2017-02-02 (×3): qty 2

## 2017-02-02 MED ORDER — CIPROFLOXACIN IN D5W 400 MG/200ML IV SOLN
400.0000 mg | INTRAVENOUS | Status: AC
  Administered 2017-02-02: 400 mg via INTRAVENOUS
  Filled 2017-02-02: qty 200

## 2017-02-02 SURGICAL SUPPLY — 48 items
APPLICATOR ARISTA FLEXITIP XL (MISCELLANEOUS) ×3 IMPLANT
APPLIER CLIP LAPSCP 10X32 DD (CLIP) ×3 IMPLANT
BAG HAMPER (MISCELLANEOUS) ×3 IMPLANT
BAG RETRIEVAL 10 (BASKET) ×1
BAG RETRIEVAL 10MM (BASKET) ×1
CHLORAPREP W/TINT 26ML (MISCELLANEOUS) ×3 IMPLANT
CLOTH BEACON ORANGE TIMEOUT ST (SAFETY) ×3 IMPLANT
COVER LIGHT HANDLE STERIS (MISCELLANEOUS) ×6 IMPLANT
DECANTER SPIKE VIAL GLASS SM (MISCELLANEOUS) ×3 IMPLANT
DISSECTOR BLUNT TIP ENDO 5MM (MISCELLANEOUS) ×3 IMPLANT
ELECT REM PT RETURN 9FT ADLT (ELECTROSURGICAL) ×3
ELECTRODE REM PT RTRN 9FT ADLT (ELECTROSURGICAL) ×1 IMPLANT
FILTER SMOKE EVAC LAPAROSHD (FILTER) ×3 IMPLANT
FORMALIN 10 PREFIL 120ML (MISCELLANEOUS) ×3 IMPLANT
GLOVE BIOGEL PI IND STRL 7.0 (GLOVE) ×1 IMPLANT
GLOVE BIOGEL PI INDICATOR 7.0 (GLOVE) ×2
GLOVE SURG SS PI 7.5 STRL IVOR (GLOVE) ×3 IMPLANT
GOWN STRL REUS W/ TWL XL LVL3 (GOWN DISPOSABLE) ×1 IMPLANT
GOWN STRL REUS W/TWL LRG LVL3 (GOWN DISPOSABLE) ×6 IMPLANT
GOWN STRL REUS W/TWL XL LVL3 (GOWN DISPOSABLE) ×2
HEMOSTAT ARISTA ABSORB 3G PWDR (MISCELLANEOUS) ×3 IMPLANT
HEMOSTAT SNOW SURGICEL 2X4 (HEMOSTASIS) ×3 IMPLANT
INST SET LAPROSCOPIC AP (KITS) ×3 IMPLANT
IV NS IRRIG 3000ML ARTHROMATIC (IV SOLUTION) IMPLANT
KIT ROOM TURNOVER APOR (KITS) ×3 IMPLANT
MANIFOLD NEPTUNE II (INSTRUMENTS) ×3 IMPLANT
NEEDLE INSUFFLATION 14GA 120MM (NEEDLE) ×3 IMPLANT
NEEDLE INSUFFLATION 14GA 150MM (NEEDLE) ×3 IMPLANT
NS IRRIG 1000ML POUR BTL (IV SOLUTION) ×3 IMPLANT
PACK LAP CHOLE LZT030E (CUSTOM PROCEDURE TRAY) ×3 IMPLANT
PAD ARMBOARD 7.5X6 YLW CONV (MISCELLANEOUS) ×3 IMPLANT
SET BASIN LINEN APH (SET/KITS/TRAYS/PACK) ×3 IMPLANT
SET TUBE IRRIG SUCTION NO TIP (IRRIGATION / IRRIGATOR) IMPLANT
SLEEVE ENDOPATH XCEL 5M (ENDOMECHANICALS) ×3 IMPLANT
SPONGE GAUZE 2X2 8PLY STER LF (GAUZE/BANDAGES/DRESSINGS) ×1
SPONGE GAUZE 2X2 8PLY STRL LF (GAUZE/BANDAGES/DRESSINGS) ×2 IMPLANT
STAPLER VISISTAT (STAPLE) ×3 IMPLANT
SUT VICRYL 0 UR6 27IN ABS (SUTURE) ×3 IMPLANT
SYS BAG RETRIEVAL 10MM (BASKET) ×1
SYSTEM BAG RETRIEVAL 10MM (BASKET) ×1 IMPLANT
TAPE PAPER 3X10 WHT MICROPORE (GAUZE/BANDAGES/DRESSINGS) ×3 IMPLANT
TROCAR ENDO BLADELESS 11MM (ENDOMECHANICALS) ×3 IMPLANT
TROCAR XCEL NON-BLD 5MMX100MML (ENDOMECHANICALS) ×3 IMPLANT
TROCAR XCEL UNIV SLVE 11M 100M (ENDOMECHANICALS) ×3 IMPLANT
TUBE CONNECTING 12'X1/4 (SUCTIONS) ×1
TUBE CONNECTING 12X1/4 (SUCTIONS) ×2 IMPLANT
TUBING INSUFFLATION (TUBING) ×3 IMPLANT
WARMER LAPAROSCOPE (MISCELLANEOUS) ×3 IMPLANT

## 2017-02-02 NOTE — Progress Notes (Signed)
Pt. Received back from surgery. Requests her Klonopin. Mother at bedside.

## 2017-02-02 NOTE — Anesthesia Postprocedure Evaluation (Signed)
Anesthesia Post Note  Patient: Jacqueline Alvarado  Procedure(s) Performed: Procedure(s) (LRB): LAPAROSCOPIC CHOLECYSTECTOMY (N/A)  Patient location during evaluation: PACU Anesthesia Type: General Level of consciousness: awake and alert and oriented Pain management: pain level controlled Vital Signs Assessment: post-procedure vital signs reviewed and stable Respiratory status: spontaneous breathing Cardiovascular status: blood pressure returned to baseline and stable Postop Assessment: no signs of nausea or vomiting Anesthetic complications: no     Last Vitals:  Vitals:   02/02/17 1219 02/02/17 1230  BP: (!) 149/107 (!) 151/78  Pulse: (!) 40   Resp: 19 13  Temp: 36.6 C     Last Pain:  Vitals:   02/02/17 1219  TempSrc:   PainSc: 10-Worst pain ever                 Appolonia Ackert

## 2017-02-02 NOTE — Op Note (Signed)
Patient:  Jacqueline Alvarado  DOB:  12/29/1995  MRN:  [id number]   Preop Diagnosis:  Acute cholecystitis, cholelithiasis  Postop Diagnosis:  Same, hydrops of gallbladder  Procedure:  Laparoscopic cholecystectomy  Surgeon:  Franky MachoMark Vollie Aaron, M.D.  Anes:  Gen. endotracheal  Indications:  Patient is a 21 year old black female who presents with acute cholecystitis secondary to cholelithiasis. The risks and benefits of the procedure including bleeding, infection, hepatobiliary injury, and the possibility of an open procedure were fully explained to the patient, who gave informed consent.  Procedure note:  The patient was placed the supine position. After induction of general endotracheal anesthesia, the abdomen was prepped and draped using the usual sterile technique with DuraPrep. Surgical site confirmation was performed.  A supraumbilical incision was made down to the fascia. A Veress needle was introduced into the abdominal cavity and confirmation of placement was done using the saline drop test. The abdomen was then insufflated to 16 mmHg pressure. An 11 mm trocar was introduced at the abdominal cavity under direct visualization without difficulty. The patient was placed in reverse Trendelenburg position and an additional 11 mm trocar was placed the epigastric region and 5 mm trochars were placed the right upper quadrant and right flank regions. The liver was inspected and noted to be within normal limits. The gallbladder was distended and tense with a thickened gallbladder wall. An incision was made at the fundus of the gallbladder and its fluid contents evacuated. Hydrops was found. The gallbladder was retracted in a dynamic fashion in order to provide a critical view of the triangle of Calot. The cystic duct was first identified. Its juncture to the infundibulum was fully identified. Endoclips were placed proximally and distally on the cystic duct, and the cystic duct was divided. This was likewise done  to the cystic artery. The gallbladder was freed away from the gallbladder fossa using Bovie electrocautery. The gallbladder was delivered through the epigastric trocar site using an Endo Catch bag. The gallbladder fossa was inspected and any bleeding was controlled using Bovie electrocautery. Arista and Surgicel were placed the gallbladder fossa. All fluid and air were then evacuated from the abdominal cavity prior to the removal of the trochars.  All wounds were irrigated with normal saline. All wounds were injected with 0.5% Sensorcaine. The supraumbilical fascia was reapproximated using an 0 Vicryl interrupted suture. All skin incisions were closed using staples. Betadine ointment and dry sterile dressings were applied.  All tape and needle counts were correct at the end of the procedure. The patient was extubated in the operating room and transferred to PACU in stable condition.  Complications:  None  EBL:  50 mL  Specimen:  Gallbladder

## 2017-02-02 NOTE — Anesthesia Procedure Notes (Signed)
Procedure Name: Intubation Date/Time: 02/02/2017 10:42 AM Performed by: Glynn OctaveANIEL, Salar Molden E Pre-anesthesia Checklist: Patient identified, Patient being monitored, Timeout performed, Emergency Drugs available and Suction available Patient Re-evaluated:Patient Re-evaluated prior to induction Oxygen Delivery Method: Circle system utilized Preoxygenation: Pre-oxygenation with 100% oxygen Induction Type: IV induction, Rapid sequence and Cricoid Pressure applied Ventilation: Mask ventilation without difficulty Laryngoscope Size: Glidescope Grade View: Grade I Tube type: Oral Tube size: 7.0 mm Number of attempts: 1 Airway Equipment and Method: Stylet Placement Confirmation: ETT inserted through vocal cords under direct vision,  positive ETCO2 and breath sounds checked- equal and bilateral Secured at: 21 cm Tube secured with: Tape Dental Injury: Teeth and Oropharynx as per pre-operative assessment

## 2017-02-02 NOTE — H&P (View-Only) (Signed)
  Subjective: Patient still having intermittent right upper quadrant abdominal pain.  Objective: Vital signs in last 24 hours: Temp:  [98.6 F (37 C)-99.2 F (37.3 C)] 99.2 F (37.3 C) (07/22 0625) Pulse Rate:  [52-53] 53 (07/22 0625) Resp:  [20] 20 (07/22 0625) BP: (108-135)/(51-57) 108/51 (07/22 0625) SpO2:  [100 %] 100 % (07/22 0625) Last BM Date: 01/27/17  Intake/Output from previous day: 07/21 0701 - 07/22 0700 In: 2177.5 [P.O.:720; I.V.:1457.5] Out: -  Intake/Output this shift: No intake/output data recorded.  General appearance: alert, cooperative and no distress GI: Soft with mild right upper quadrant discomfort to palpation. No rigidity noted.  Lab Results:   Recent Labs  01/30/17 0632 01/31/17 0604  WBC 14.6* 9.4  HGB 12.0 11.4*  HCT 37.6 36.2  PLT 280 274   BMET  Recent Labs  01/30/17 0632 01/31/17 0604  NA 134* 136  K 3.7 3.6  CL 103 104  CO2 24 23  GLUCOSE 101* 87  BUN 7 7  CREATININE 0.70 0.63  CALCIUM 8.8* 8.5*   PT/INR No results for input(s): LABPROT, INR in the last 72 hours.  Studies/Results: Nm Hepatobiliary Liver Func  Result Date: 01/30/2017 CLINICAL DATA:  Chronic RIGHT upper quadrant pain worsening over 5 days, vomiting, nausea, cholelithiasis EXAM: NUCLEAR MEDICINE HEPATOBILIARY IMAGING TECHNIQUE: Sequential images of the abdomen were obtained out to 60 minutes following intravenous administration of radiopharmaceutical. RADIOPHARMACEUTICALS:  5.5 mCi Tc-5259m  Choletec IV COMPARISON:  12/21/2015 Correlation: CT abdomen and pelvis 01/29/2017 FINDINGS: Normal tracer clearance from bloodstream indicating normal hepatocellular function. Prompt excretion of tracer into biliary tree. Small bowel visualized by 20 minutes. At 1 hour, gallbladder had not visualized. Relative photopenic area identified at the position of the gallbladder based on anatomy as demonstrated by coronal CT images. Patient was then injected with 4 mg of morphine  sulfate IV and imaging was continued for an additional 60 minutes. Gallbladder did not visualize following morphine augmentation. Findings are consistent with cystic duct obstruction and acute cholecystitis. IMPRESSION: Nonvisualization of the gallbladder despite morphine augmentation consistent with acute cholecystitis. Normal hepatocellular function with patent CBD. Electronically Signed   By: Jacqueline SouthwardMark  Alvarado M.D.   On: 01/30/2017 11:07    Anti-infectives: Anti-infectives    None      Assessment/Plan: Impression: Cholecystitis, cholelithiasis Plan: Will take patient to the operating room tomorrow for laparoscopic cholecystectomy. The risks and benefits of the procedure including bleeding, infection, hepatobiliary injury, and the possibility of an open procedure were fully explained to the patient, who gave informed consent.  LOS: 0 days    Jacqueline Alvarado 02/01/2017

## 2017-02-02 NOTE — Interval H&P Note (Signed)
History and Physical Interval Note:  02/02/2017 9:46 AM  Jacqueline Alvarado  has presented today for surgery, with the diagnosis of cholecystitis, cholelithiasis  The various methods of treatment have been discussed with the patient and family. After consideration of risks, benefits and other options for treatment, the patient has consented to  Procedure(s): LAPAROSCOPIC CHOLECYSTECTOMY (N/A) as a surgical intervention .  The patient's history has been reviewed, patient examined, no change in status, stable for surgery.  I have reviewed the patient's chart and labs.  Questions were answered to the patient's satisfaction.     Franky MachoMark Durell Lofaso

## 2017-02-02 NOTE — Transfer of Care (Signed)
Immediate Anesthesia Transfer of Care Note  Patient: Jacqueline Alvarado  Procedure(s) Performed: Procedure(s): LAPAROSCOPIC CHOLECYSTECTOMY (N/A)  Patient Location: PACU  Anesthesia Type:General  Level of Consciousness: awake, alert  and oriented  Airway & Oxygen Therapy: Patient Spontanous Breathing and Patient connected to face mask oxygen  Post-op Assessment: Report given to RN  Post vital signs: Reviewed and stable  Last Vitals:  Vitals:   02/02/17 1015 02/02/17 1030  BP: 127/73 (!) 110/49  Pulse:    Resp: (!) 25 (!) 62  Temp:      Last Pain:  Vitals:   02/02/17 0909  TempSrc: Oral  PainSc:       Patients Stated Pain Goal: 8 (02/02/17 0909)  Complications: No apparent anesthesia complications

## 2017-02-02 NOTE — Anesthesia Preprocedure Evaluation (Addendum)
Anesthesia Evaluation  Patient identified by MRN, date of birth, ID band Patient awake    Reviewed: Allergy & Precautions, H&P , NPO status , Patient's Chart, lab work & pertinent test results  History of Anesthesia Complications (+) PONV and history of anesthetic complications  Airway Mallampati: II  TM Distance: >3 FB Neck ROM: Full    Dental  (+) Teeth Intact, Dental Advisory Given   Pulmonary sleep apnea (not diagnosed yet) , former smoker,    breath sounds clear to auscultation       Cardiovascular negative cardio ROS   Rhythm:Regular Rate:Normal     Neuro/Psych  Headaches, PSYCHIATRIC DISORDERS Anxiety Depression    GI/Hepatic negative GI ROS,   Endo/Other  Morbid obesity  Renal/GU      Musculoskeletal   Abdominal (+) + obese,   Peds  Hematology  (+) anemia ,   Anesthesia Other Findings   Reproductive/Obstetrics                             Anesthesia Physical Anesthesia Plan  ASA: II  Anesthesia Plan: General   Post-op Pain Management:    Induction: Intravenous, Rapid sequence and Cricoid pressure planned  PONV Risk Score and Plan:   Airway Management Planned: Oral ETT and Video Laryngoscope Planned  Additional Equipment:   Intra-op Plan:   Post-operative Plan: Extubation in OR  Informed Consent: I have reviewed the patients History and Physical, chart, labs and discussed the procedure including the risks, benefits and alternatives for the proposed anesthesia with the patient or authorized representative who has indicated his/her understanding and acceptance.     Plan Discussed with:   Anesthesia Plan Comments:        Anesthesia Quick Evaluation

## 2017-02-03 ENCOUNTER — Encounter (HOSPITAL_COMMUNITY): Payer: Self-pay | Admitting: General Surgery

## 2017-02-03 LAB — COMPREHENSIVE METABOLIC PANEL
ALBUMIN: 2.7 g/dL — AB (ref 3.5–5.0)
ALK PHOS: 23 U/L — AB (ref 38–126)
ALT: 38 U/L (ref 14–54)
ANION GAP: 6 (ref 5–15)
AST: 52 U/L — ABNORMAL HIGH (ref 15–41)
BILIRUBIN TOTAL: 0.5 mg/dL (ref 0.3–1.2)
BUN: 5 mg/dL — ABNORMAL LOW (ref 6–20)
CALCIUM: 8.2 mg/dL — AB (ref 8.9–10.3)
CO2: 29 mmol/L (ref 22–32)
CREATININE: 0.67 mg/dL (ref 0.44–1.00)
Chloride: 102 mmol/L (ref 101–111)
GFR calc non Af Amer: >60 mL/min (ref >60)
GLUCOSE: 106 mg/dL — AB (ref 65–99)
Potassium: 3.2 mmol/L — ABNORMAL LOW (ref 3.5–5.1)
SODIUM: 137 mmol/L (ref 135–145)
TOTAL PROTEIN: 6 g/dL — AB (ref 6.5–8.1)

## 2017-02-03 LAB — CBC
HCT: 31.6 % — ABNORMAL LOW (ref 36.0–46.0)
Hemoglobin: 10 g/dL — ABNORMAL LOW (ref 12.0–15.0)
MCH: 22.8 pg — AB (ref 26.0–34.0)
MCHC: 31.6 g/dL (ref 30.0–36.0)
MCV: 72.1 fL — ABNORMAL LOW (ref 78.0–100.0)
PLATELETS: 282 10*3/uL (ref 150–400)
RBC: 4.38 MIL/uL (ref 3.87–5.11)
RDW: 15.1 % (ref 11.5–15.5)
WBC: 11.6 10*3/uL — ABNORMAL HIGH (ref 4.0–10.5)

## 2017-02-03 MED ORDER — POVIDONE-IODINE 10 % OINT PACKET
TOPICAL_OINTMENT | CUTANEOUS | Status: DC | PRN
  Administered 2017-02-03: 1 via TOPICAL

## 2017-02-03 MED ORDER — BUPIVACAINE HCL (PF) 0.5 % IJ SOLN
INTRAMUSCULAR | Status: DC | PRN
  Administered 2017-02-03: 10 mL

## 2017-02-03 MED ORDER — OXYCODONE-ACETAMINOPHEN 7.5-325 MG PO TABS: 1.0000 | ORAL_TABLET | ORAL | 0 refills | Status: DC | PRN

## 2017-02-03 MED ORDER — ENOXAPARIN SODIUM 80 MG/0.8ML ~~LOC~~ SOLN
80.0000 mg | SUBCUTANEOUS | Status: DC
  Administered 2017-02-03: 80 mg via SUBCUTANEOUS
  Filled 2017-02-03: qty 0.8

## 2017-02-03 MED ORDER — POLYETHYLENE GLYCOL 3350 17 G PO PACK
17.0000 g | PACK | Freq: Once | ORAL | Status: AC
  Administered 2017-02-03: 17 g via ORAL
  Filled 2017-02-03: qty 1

## 2017-02-03 NOTE — Discharge Summary (Signed)
Physician Discharge Summary  Patient ID: Jacqueline Alvarado MRN: [id number] DOB/AGE: 23/08/1995 23 y.o.  Admit date: 01/29/2017 Discharge date: 02/03/2017  Admission Diagnoses:Right upper quadrant abdominal pain  Discharge Diagnoses: Acute cholecystitis, cholelithiasis Principal Problem:   RUQ pain Active Problems:   Morbid obesity (HCC)   Biliary colic   Calculus of gallbladder with acute cholecystitis without obstruction   Acute cholecystitis   Discharged Condition: good  Hospital Course: Patient is a 21 year old black female who presented to the emergency room with worsening right upper quadrant abdominal pain. CT scan of the abdomen revealed cholelithiasis. She subsequently underwent laparoscopic cholecystectomy on 02/02/2017. She tolerated the procedure well. Postoperative course was unremarkable. Her diet was advanced without difficulty. She is being discharged home on 02/03/2017 in good and improving condition.  Treatments: surgery: Laparoscopic cholecystectomy on 02/02/2017  Discharge Exam: Blood pressure (!) 112/46, pulse 75, temperature 98.9 F (37.2 C), temperature source Oral, resp. rate 16, height 5' 8.5" (1.74 m), weight (!) 338 lb 11.2 oz (153.6 kg), last menstrual period 01/08/2017, SpO2 98 %. General appearance: alert, cooperative and no distress Resp: clear to auscultation bilaterally Cardio: regular rate and rhythm, S1, S2 normal, no murmur, click, rub or gallop GI: Soft, dressings dry and intact.  Disposition: 01-Home or Self Care  Discharge Instructions    Diet general    Complete by:  As directed    Increase activity slowly    Complete by:  As directed      Allergies as of 02/03/2017   No Known Allergies     Medication List    STOP taking these medications   HYDROcodone-acetaminophen 5-325 MG tablet Commonly known as:  NORCO     TAKE these medications   acetaminophen 500 MG tablet Commonly known as:  TYLENOL Take 1,000 mg by mouth every 6 (six)  hours as needed for mild pain or headache.   clonazePAM 1 MG tablet Commonly known as:  KLONOPIN Take 1 mg by mouth 2 (two) times daily as needed for anxiety.   escitalopram 20 MG tablet Commonly known as:  LEXAPRO Take 20 mg by mouth daily.   ibuprofen 200 MG tablet Commonly known as:  ADVIL,MOTRIN Take 400-600 mg by mouth every 6 (six) hours as needed for headache or mild pain.   MONONESSA 0.25-35 MG-MCG tablet Generic drug:  norgestimate-ethinyl estradiol Take 1 tablet by mouth daily.   ondansetron 4 MG tablet Commonly known as:  ZOFRAN Take 1 tablet (4 mg total) by mouth every 8 (eight) hours as needed.   oxyCODONE-acetaminophen 7.5-325 MG tablet Commonly known as:  PERCOCET Take 1-2 tablets by mouth every 4 (four) hours as needed.   pantoprazole 40 MG tablet Commonly known as:  PROTONIX Take 1 tablet by mouth daily.      Follow-up Information    Franky MachoJenkins, Kanani Mowbray, MD. Schedule an appointment as soon as possible for a visit on 02/12/2017.   Specialty:  General Surgery Contact information: [address] [place] [id number] [phone]           Signed: Franky MachoMark Percival Glasheen 02/03/2017, 8:18 AM

## 2017-02-03 NOTE — Care Management Note (Signed)
Case Management Note  Patient Details  Name: Jacqueline Alvarado MRN: [id number] Date of Birth: 08/10/1995  Subjective/Objective:                  Admitted with cholesystitis, s/p lap chole 7/23. Chart reviewed for CM needs. Pt from home, lives with family. Has PCP, insurance with drug coverage and transportation.   Action/Plan: No CM needs identified prior to DC. DC home today with self care.   Expected Discharge Date:  02/03/17               Expected Discharge Plan:  Home/Self Care  In-House Referral:  NA  Discharge planning Services  CM Consult  Post Acute Care Choice:  NA Choice offered to:  NA  Status of Service:  Completed, signed off  Malcolm MetroChildress, Naavya Postma Demske, RN 02/03/2017, 8:46 AM

## 2017-02-03 NOTE — Progress Notes (Signed)
Pt c/o pain and constipation throughout night. Dr. Lovell SheehanJenkins notified of constipation, Milk of Magnesia ordered. Educated pt on importance of early mobility and incentive spirometer use. Also educated pt on side effects of IV narcotics and effect on GI system as well as oral pain medication use and ways to control pain. Pt is aware of effects of dilaudid and continues to request regularly in spite of issues with constipation. Will continue to monitor and educate.

## 2017-02-03 NOTE — Discharge Instructions (Signed)
Laparoscopic Cholecystectomy, Care After °This sheet gives you information about how to care for yourself after your procedure. Your health care provider may also give you more specific instructions. If you have problems or questions, contact your health care provider. °What can I expect after the procedure? °After the procedure, it is common to have: °· Pain at your incision sites. You will be given medicines to control this pain. °· Mild nausea or vomiting. °· Bloating and possible shoulder pain from the air-like gas that was used during the procedure. °Follow these instructions at home: °Incision care  ° °· Follow instructions from your health care provider about how to take care of your incisions. Make sure you: °¨ Wash your hands with soap and water before you change your bandage (dressing). If soap and water are not available, use hand sanitizer. °¨ Change your dressing as told by your health care provider. °¨ Leave stitches (sutures), skin glue, or adhesive strips in place. These skin closures may need to be in place for 2 weeks or longer. If adhesive strip edges start to loosen and curl up, you may trim the loose edges. Do not remove adhesive strips completely unless your health care provider tells you to do that. °· Do not take baths, swim, or use a hot tub until your health care provider approves. Ask your health care provider if you can take showers. You may only be allowed to take sponge baths for bathing. °· Check your incision area every day for signs of infection. Check for: °¨ More redness, swelling, or pain. °¨ More fluid or blood. °¨ Warmth. °¨ Pus or a bad smell. °Activity  °· Do not drive or use heavy machinery while taking prescription pain medicine. °· Do not lift anything that is heavier than 10 lb (4.5 kg) until your health care provider approves. °· Do not play contact sports until your health care provider approves. °· Do not drive for 24 hours if you were given a medicine to help you relax  (sedative). °· Rest as needed. Do not return to work or school until your health care provider approves. °General instructions  °· Take over-the-counter and prescription medicines only as told by your health care provider. °· To prevent or treat constipation while you are taking prescription pain medicine, your health care provider may recommend that you: °¨ Drink enough fluid to keep your urine clear or pale yellow. °¨ Take over-the-counter or prescription medicines. °¨ Eat foods that are high in fiber, such as fresh fruits and vegetables, whole grains, and beans. °¨ Limit foods that are high in fat and processed sugars, such as fried and sweet foods. °Contact a health care provider if: °· You develop a rash. °· You have more redness, swelling, or pain around your incisions. °· You have more fluid or blood coming from your incisions. °· Your incisions feel warm to the touch. °· You have pus or a bad smell coming from your incisions. °· You have a fever. °· One or more of your incisions breaks open. °Get help right away if: °· You have trouble breathing. °· You have chest pain. °· You have increasing pain in your shoulders. °· You faint or feel dizzy when you stand. °· You have severe pain in your abdomen. °· You have nausea or vomiting that lasts for more than one day. °· You have leg pain. °This information is not intended to replace advice given to you by your health care provider. Make sure you discuss any   questions you have with your health care provider. °Document Released: 06/30/2005 Document Revised: 01/19/2016 Document Reviewed: 12/17/2015 °Elsevier Interactive Patient Education © 2017 [employer] ° °

## 2017-02-03 NOTE — Progress Notes (Signed)
Patient discharged to home.  Discharge instruction given to mother and verbalized understanding.

## 2017-02-12 ENCOUNTER — Ambulatory Visit (INDEPENDENT_AMBULATORY_CARE_PROVIDER_SITE_OTHER): Payer: Self-pay | Admitting: General Surgery

## 2017-02-12 ENCOUNTER — Encounter: Payer: Self-pay | Admitting: General Surgery

## 2017-02-12 VITALS — BP 150/91 | HR 73 | Temp 97.1°F | Resp 18 | Ht 69.0 in | Wt 341.0 lb

## 2017-02-12 DIAGNOSIS — Z09 Encounter for follow-up examination after completed treatment for conditions other than malignant neoplasm: Secondary | ICD-10-CM

## 2017-02-12 NOTE — Progress Notes (Signed)
Subjective:     Jacqueline Alvarado  Status post laparoscopic cholecystectomy. Doing well. Preoperative symptoms resolved. Objective:    BP (!) 150/91   Pulse 73   Temp (!) 97.1 F (36.2 C)   Resp 18   Ht 5\' 9"  (1.753 m)   Wt (!) 341 lb (154.7 kg)   BMI 50.36 kg/m   General:  alert, cooperative and no distress  Abdomen soft, incisions healing well. Staples removed, Steri-Strips applied. Final pathology consistent with diagnosis.     Assessment:    Doing well postoperatively.    Plan:   Increase activity as able. Follow-up as needed.

## 2017-02-17 ENCOUNTER — Other Ambulatory Visit: Payer: Self-pay | Admitting: Obstetrics and Gynecology

## 2017-03-05 ENCOUNTER — Encounter: Payer: Self-pay | Admitting: Gastroenterology

## 2017-03-05 ENCOUNTER — Ambulatory Visit: Payer: BLUE CROSS/BLUE SHIELD | Admitting: Gastroenterology

## 2017-03-05 ENCOUNTER — Telehealth: Payer: Self-pay | Admitting: Gastroenterology

## 2017-03-05 NOTE — Telephone Encounter (Signed)
PATIENT WAS A NO SHOW AND LETTER SENT  °

## 2017-03-12 ENCOUNTER — Ambulatory Visit: Payer: BLUE CROSS/BLUE SHIELD | Admitting: Gastroenterology

## 2017-08-25 DIAGNOSIS — Z79899 Other long term (current) drug therapy: Secondary | ICD-10-CM | POA: Insufficient documentation

## 2017-08-25 DIAGNOSIS — Z87891 Personal history of nicotine dependence: Secondary | ICD-10-CM | POA: Insufficient documentation

## 2017-08-25 DIAGNOSIS — D509 Iron deficiency anemia, unspecified: Secondary | ICD-10-CM | POA: Insufficient documentation

## 2017-08-25 DIAGNOSIS — K648 Other hemorrhoids: Secondary | ICD-10-CM | POA: Insufficient documentation

## 2017-08-25 DIAGNOSIS — J111 Influenza due to unidentified influenza virus with other respiratory manifestations: Secondary | ICD-10-CM | POA: Insufficient documentation

## 2017-08-26 ENCOUNTER — Other Ambulatory Visit: Payer: Self-pay

## 2017-08-26 ENCOUNTER — Encounter (HOSPITAL_COMMUNITY): Payer: Self-pay | Admitting: *Deleted

## 2017-08-26 ENCOUNTER — Emergency Department (HOSPITAL_COMMUNITY)
Admission: EM | Admit: 2017-08-26 | Discharge: 2017-08-26 | Disposition: A | Discharge status: Home / Self Care | Payer: BLUE CROSS/BLUE SHIELD | Attending: Emergency Medicine | Admitting: Emergency Medicine

## 2017-08-26 DIAGNOSIS — J111 Influenza due to unidentified influenza virus with other respiratory manifestations: Secondary | ICD-10-CM

## 2017-08-26 DIAGNOSIS — R69 Illness, unspecified: Secondary | ICD-10-CM

## 2017-08-26 DIAGNOSIS — K648 Other hemorrhoids: Secondary | ICD-10-CM

## 2017-08-26 DIAGNOSIS — D509 Iron deficiency anemia, unspecified: Secondary | ICD-10-CM

## 2017-08-26 LAB — HEMOGLOBIN AND HEMATOCRIT, BLOOD
HCT: 37.9 % (ref 36.0–46.0)
Hemoglobin: 11.6 g/dL — ABNORMAL LOW (ref 12.0–15.0)

## 2017-08-26 LAB — I-STAT BETA HCG BLOOD, ED (MC, WL, AP ONLY)

## 2017-08-26 LAB — COMPREHENSIVE METABOLIC PANEL
ALT: 15 U/L (ref 14–54)
ANION GAP: 9 (ref 5–15)
AST: 19 U/L (ref 15–41)
Albumin: 3.4 g/dL — ABNORMAL LOW (ref 3.5–5.0)
Alkaline Phosphatase: 48 U/L (ref 38–126)
BUN: 11 mg/dL (ref 6–20)
CHLORIDE: 100 mmol/L — AB (ref 101–111)
CO2: 24 mmol/L (ref 22–32)
Calcium: 8.2 mg/dL — ABNORMAL LOW (ref 8.9–10.3)
Creatinine, Ser: 0.7 mg/dL (ref 0.44–1.00)
GFR calc Af Amer: >60 mL/min (ref >60)
Glucose, Bld: 113 mg/dL — ABNORMAL HIGH (ref 65–99)
POTASSIUM: 3.5 mmol/L (ref 3.5–5.1)
Sodium: 133 mmol/L — ABNORMAL LOW (ref 135–145)
Total Bilirubin: 0.1 mg/dL — ABNORMAL LOW (ref 0.3–1.2)
Total Protein: 6.8 g/dL (ref 6.5–8.1)

## 2017-08-26 LAB — CBC
HEMATOCRIT: 35.1 % — AB (ref 36.0–46.0)
HEMOGLOBIN: 10.9 g/dL — AB (ref 12.0–15.0)
MCH: 22.7 pg — ABNORMAL LOW (ref 26.0–34.0)
MCHC: 31.1 g/dL (ref 30.0–36.0)
MCV: 73 fL — AB (ref 78.0–100.0)
Platelets: 270 10*3/uL (ref 150–400)
RBC: 4.81 MIL/uL (ref 3.87–5.11)
RDW: 16 % — ABNORMAL HIGH (ref 11.5–15.5)
WBC: 12.8 10*3/uL — AB (ref 4.0–10.5)

## 2017-08-26 LAB — TYPE AND SCREEN
ABO/RH(D): AB POS
ANTIBODY SCREEN: NEGATIVE

## 2017-08-26 LAB — RAPID STREP SCREEN (MED CTR MEBANE ONLY): Streptococcus, Group A Screen (Direct): NEGATIVE

## 2017-08-26 LAB — POC OCCULT BLOOD, ED: FECAL OCCULT BLD: POSITIVE — AB

## 2017-08-26 MED ORDER — HYDROCORTISONE ACETATE 25 MG RE SUPP: 25.0000 mg | Freq: Two times a day (BID) | RECTAL | 0 refills | Status: DC

## 2017-08-26 NOTE — Discharge Instructions (Signed)
Return if bleeding gets worse or if abdominal pain gets worse.

## 2017-08-26 NOTE — ED Provider Notes (Signed)
[facility] EMERGENCY DEPARTMENT Provider Note   CSN: [id number] Arrival date & time: 08/25/17  2319     History   Chief Complaint Chief Complaint  Patient presents with  . Rectal Bleeding    HPI Jacqueline Alvarado is a 22 y.o. female.  The history is provided by the patient.  She has a history of chronic abdominal pain and depression and comes in because of rectal bleeding.  She had 2 bowel movements at home with bright red blood, and a third bowel movement since arriving in the emergency department.  She does have history of having had rectal bleeding in the past and colonoscopy showed internal hemorrhoids and ulcers in the ileum.  She has had some abdominal cramping which is mild.  Also, she has had a respiratory illness for the last 3 days.  She had sore throat and nasal congestion and fever as high as 101.  She had chills and sweats and some body aches.  Symptoms had started to improve.  She has not had any coughing or vomiting.  She is not aware of any sick contacts.  She did get the influenza immunization this season.  Past Medical History:  Diagnosis Date  . Abdominal pain, chronic, right upper quadrant   . Abnormal Pap smear of cervix   . Anemia    iron deficiency  . Anxiety   . Depression   . Headache(784.0)   . PONV (postoperative nausea and vomiting)    Hx: of nausea only at age 34    Patient Active Problem List   Diagnosis Date Noted  . Acute cholecystitis 02/01/2017  . Calculus of gallbladder with acute cholecystitis without obstruction   . Biliary colic   . Abdominal pain 01/29/2017  . Gallstones   . Inflammation of the retroperitoneum   . Cicatrix   . Rectal bleeding 12/20/2015  . RUQ pain 12/20/2015  . Hemorrhoids 12/20/2015  . Contraceptive management 09/10/2015  . Trichomonal vaginitis 09/10/2015  . BV (bacterial vaginosis) 09/10/2015  . Hypertrophy of nasal turbinates 06/24/2013  . Tonsillar hypertrophy 06/24/2013  . Obstructive sleep apnea (adult)  (pediatric) 06/24/2013  . Morbid obesity (HCC) 06/24/2013    Past Surgical History:  Procedure Laterality Date  . CHOLECYSTECTOMY N/A 02/02/2017   Procedure: LAPAROSCOPIC CHOLECYSTECTOMY;  Surgeon: Franky Macho, MD;  Location: [facility] ORS;  Service: General;  Laterality: N/A;  . COLONOSCOPY N/A 01/07/2016   Dr. Darrick Penna: Nonbleeding internal hemorrhoids, large. 3 bands successfully placed. Distal ileum containing multiple ulcers which she felt was related to NSAIDs. Biopsies nonspecific.  Marland Kitchen HEMORRHOID BANDING N/A 01/07/2016   Procedure: HEMORRHOID BANDING;  Surgeon: West Bali, MD;  Location: [facility] ENDO SUITE;  Service: Endoscopy;  Laterality: N/A;  . MASS EXCISION N/A 11/26/2016   Procedure: EXCISION OF ABDOMINAL CICATRIX 4CM;  Surgeon: Franky Macho, MD;  Location: [facility] ORS;  Service: General;  Laterality: N/A;  p knows to arrive at 7:50  . NASAL SEPTOPLASTY W/ TURBINOPLASTY Bilateral 06/24/2013   Procedure: TURBINATE REDUCTION;  Surgeon: Flo Shanks, MD;  Location: [facility] OR;  Service: ENT;  Laterality: Bilateral;  . TONSILLECTOMY AND ADENOIDECTOMY Bilateral 06/24/2013   Procedure: TONSILLECTOMY ;  Surgeon: Flo Shanks, MD;  Location: [facility] OR;  Service: ENT;  Laterality: Bilateral;  . TUMOR EXCISION     Hx: of right side of neck  . WISDOM TOOTH EXTRACTION      OB History    No data available       Home Medications    Prior  to Admission medications   Medication Sig Start Date End Date Taking? Authorizing Provider  clonazePAM (KLONOPIN) 1 MG tablet Take 1 mg by mouth 2 (two) times daily as needed for anxiety.  10/01/16  Yes [provider]  escitalopram (LEXAPRO) 20 MG tablet Take 20 mg by mouth daily.  10/01/16  Yes [provider]  acetaminophen (TYLENOL) 500 MG tablet Take 1,000 mg by mouth every 6 (six) hours as needed for mild pain or headache.    [provider]  ibuprofen (ADVIL,MOTRIN) 200 MG tablet Take 400-600 mg by mouth every 6 (six) hours as needed for  headache or mild pain.    [provider]  norgestimate-ethinyl estradiol (MONONESSA) 0.25-35 MG-MCG tablet Take 1 tablet by mouth daily.    [provider]  ondansetron (ZOFRAN) 4 MG tablet Take 1 tablet (4 mg total) by mouth every 8 (eight) hours as needed. Patient not taking: Reported on 01/29/2017 11/26/16   Franky Macho, MD  oxyCODONE-acetaminophen (PERCOCET) 7.5-325 MG tablet Take 1-2 tablets by mouth every 4 (four) hours as needed. 02/03/17   Franky Macho, MD  pantoprazole (PROTONIX) 40 MG tablet Take 1 tablet by mouth daily. 01/23/17   [provider]    Family History Family History  Problem Relation Age of Onset  . Cancer - Other Mother   . Arthritis Mother   . Hypertension Father   . COPD Father   . Cancer - Cervical Other   . Diabetes Other   . Stroke Other     Social History Social History   Tobacco Use  . Smoking status: Former Smoker    Years: 1.00    Types: Cigarettes    Last attempt to quit: 10/22/2016    Years since quitting: 0.8  . Smokeless tobacco: Never Used  . Tobacco comment: smoked only 1-2 cig weekly for 1 year  Substance Use Topics  . Alcohol use: Yes    Alcohol/week: 0.0 oz    Comment: occasional  . Drug use: Yes    Frequency: 7.0 times per week    Types: Marijuana    Comment: 3x/week     Allergies   Patient has no known allergies.   Review of Systems Review of Systems  All other systems reviewed and are negative.    Physical Exam Updated Vital Signs BP (!) 149/108 (BP Location: Right Arm)   Pulse 64   Temp 98.6 F (37 C) (Oral)   Resp 18   Ht 5\' 8"  (1.727 m)   Wt (!) 154.2 kg (340 lb)   LMP 08/19/2017   SpO2 100%   BMI 51.70 kg/m   Physical Exam  Nursing note and vitals reviewed.  Morbidly obese 22 year old female, resting comfortably and in no acute distress. Vital signs are significant for elevated blood pressure. Oxygen saturation is 100%, which is normal. Head is normocephalic and  atraumatic. PERRLA, EOMI. Oropharynx is moderately erythematous without exudate. Neck is nontender and supple without adenopathy or JVD. Back is nontender and there is no CVA tenderness. Lungs are clear without rales, wheezes, or rhonchi. Chest is nontender. Heart has regular rate and rhythm without murmur. Abdomen is soft, flat, nontender without masses or hepatosplenomegaly and peristalsis is normoactive. Rectal: Normal sphincter tone.  2 internal hemorrhoids are palpable.  Formed stool is present.  Small amount of pinkish blood present. Extremities have no cyanosis or edema, full range of motion is present. Skin is warm and dry without rash. Neurologic: Mental status is normal, cranial  nerves are intact, there are no motor or sensory deficits.  ED Treatments / Results  Labs (all labs ordered are listed, but only abnormal results are displayed) Labs Reviewed  COMPREHENSIVE METABOLIC PANEL - Abnormal; Notable for the following components:      Result Value   Sodium 133 (*)    Chloride 100 (*)    Glucose, Bld 113 (*)    Calcium 8.2 (*)    Albumin 3.4 (*)    Total Bilirubin 0.1 (*)    All other components within normal limits  CBC - Abnormal; Notable for the following components:   WBC 12.8 (*)    Hemoglobin 10.9 (*)    HCT 35.1 (*)    MCV 73.0 (*)    MCH 22.7 (*)    RDW 16.0 (*)    All other components within normal limits  HEMOGLOBIN AND HEMATOCRIT, BLOOD - Abnormal; Notable for the following components:   Hemoglobin 11.6 (*)    All other components within normal limits  POC OCCULT BLOOD, ED - Abnormal; Notable for the following components:   Fecal Occult Bld POSITIVE (*)    All other components within normal limits  RAPID STREP SCREEN (NOT AT [facility])  CULTURE, GROUP A STREP ([facility])  I-STAT BETA HCG BLOOD, ED ([facility], [facility], [facility] ONLY)  TYPE AND SCREEN   Procedures Procedures   Medications Ordered in ED Medications - No data to display   Initial Impression / Assessment and  Plan / ED Course  I have reviewed the triage vital signs and the nursing notes.  Pertinent lab results that were available during my care of the patient were reviewed by me and considered in my medical decision making (see chart for details).  Rectal bleeding which is most likely secondary to internal hemorrhoids.  Old records are reviewed, and she had a colonoscopy on January 07, 2016 which showed internal hemorrhoids which were treated with banding, and also ulcers in the ileum which biopsies showed to be nonspecific ileitis.  Respiratory infection is likely influenza, but will screen for beta strep.  We will not get influenza screen because it has been more than 48 hours since onset of symptoms.  Doubt significant amount of bleeding since she has normal pulse and actually elevated blood pressure.  Labs have been drawn in triage and hemoglobin is actually higher than it had been last July, and no elevation of BUN or creatinine.  It has been almost 6 hours since initial labs were drawn, so we will recheck hemoglobin and hematocrit and check orthostatic vital signs.   Repeat hemoglobin has actually increased.  Strep screen is negative.  Mother has expressed concern that no imaging was done, but advised that CT scan is not helpful for this type of evaluation.  She is referred back to her gastroenterologist for consideration for possible re-banding of recurrent hemorrhoids and consideration for possible flexible sigmoidoscopy or repeat colonoscopy.  Symptomatic treatment of flulike illness.  Return precautions discussed.  Final Clinical Impressions(s) / ED Diagnoses   Final diagnoses:  Bleeding internal hemorrhoids  Influenza-like illness  Microcytic anemia    ED Discharge Orders        Ordered    hydrocortisone (ANUSOL-HC) 25 MG suppository  2 times daily     08/26/17 0736       Dione BoozeGlick, Akshar Starnes, MD 08/26/17 2242

## 2017-08-26 NOTE — ED Triage Notes (Signed)
Pt c/o rectal bleeding that started today and states she has been having some right side abdominal pain; pt states she has been feeling more tired than usual; pt states for the last 2 days she has had generalized body aches with fever and cold symptoms

## 2017-08-28 LAB — CULTURE, GROUP A STREP (THRC)

## 2017-09-18 ENCOUNTER — Telehealth: Payer: Self-pay | Admitting: Gastroenterology

## 2017-09-18 NOTE — Telephone Encounter (Signed)
Please contact patient, during her hospitalization last year (when she had her gb removed), she had abnormal CT scan and Dr. Jena Gaussourk recommended f/u CT in 3 months but I see that this was never done.   I would suggest patient complete her f/u CT A/P with contrast to f/u retroperitoneal mesenteric thickening and lymphadenopathy.

## 2017-09-21 NOTE — Telephone Encounter (Signed)
LMOVM

## 2017-09-23 NOTE — Telephone Encounter (Signed)
Letter mailed to pt.  

## 2017-09-23 NOTE — Telephone Encounter (Signed)
LMOVM

## 2017-12-03 ENCOUNTER — Emergency Department (HOSPITAL_COMMUNITY)
Admission: EM | Admit: 2017-12-03 | Discharge: 2017-12-03 | Disposition: A | Discharge status: Home / Self Care | Payer: Self-pay | Attending: Emergency Medicine | Admitting: Emergency Medicine

## 2017-12-03 ENCOUNTER — Other Ambulatory Visit: Payer: Self-pay

## 2017-12-03 ENCOUNTER — Emergency Department (HOSPITAL_COMMUNITY): Payer: Self-pay

## 2017-12-03 ENCOUNTER — Encounter (HOSPITAL_COMMUNITY): Payer: Self-pay | Admitting: Emergency Medicine

## 2017-12-03 DIAGNOSIS — Z87891 Personal history of nicotine dependence: Secondary | ICD-10-CM | POA: Insufficient documentation

## 2017-12-03 DIAGNOSIS — Z79899 Other long term (current) drug therapy: Secondary | ICD-10-CM | POA: Insufficient documentation

## 2017-12-03 DIAGNOSIS — L03211 Cellulitis of face: Secondary | ICD-10-CM | POA: Insufficient documentation

## 2017-12-03 LAB — I-STAT CHEM 8, ED
BUN: 11 mg/dL (ref 6–20)
CALCIUM ION: 1.06 mmol/L — AB (ref 1.15–1.40)
CREATININE: 0.6 mg/dL (ref 0.44–1.00)
Chloride: 105 mmol/L (ref 101–111)
Glucose, Bld: 96 mg/dL (ref 65–99)
HEMATOCRIT: 40 % (ref 36.0–46.0)
HEMOGLOBIN: 13.6 g/dL (ref 12.0–15.0)
Potassium: 5.1 mmol/L (ref 3.5–5.1)
Sodium: 139 mmol/L (ref 135–145)
TCO2: 25 mmol/L (ref 22–32)

## 2017-12-03 LAB — I-STAT BETA HCG BLOOD, ED (MC, WL, AP ONLY): I-stat hCG, quantitative: <5 m[IU]/mL (ref <5)

## 2017-12-03 MED ORDER — IOHEXOL 300 MG/ML  SOLN
75.0000 mL | Freq: Once | INTRAMUSCULAR | Status: AC | PRN
  Administered 2017-12-03: 75 mL via INTRAVENOUS

## 2017-12-03 MED ORDER — HYDROCODONE-ACETAMINOPHEN 5-325 MG PO TABS: 1.0000 | ORAL_TABLET | Freq: Four times a day (QID) | ORAL | 0 refills | Status: DC | PRN

## 2017-12-03 MED ORDER — CLINDAMYCIN PHOSPHATE 600 MG/50ML IV SOLN
600.0000 mg | Freq: Once | INTRAVENOUS | Status: AC
Start: 2017-12-03 — End: 2017-12-03
  Administered 2017-12-03: 600 mg via INTRAVENOUS
  Filled 2017-12-03: qty 50

## 2017-12-03 MED ORDER — LACTATED RINGERS IV BOLUS
1000.0000 mL | Freq: Once | INTRAVENOUS | Status: AC
  Administered 2017-12-03: 1000 mL via INTRAVENOUS

## 2017-12-03 MED ORDER — FENTANYL CITRATE (PF) 100 MCG/2ML IJ SOLN
100.0000 ug | Freq: Once | INTRAMUSCULAR | Status: AC
  Administered 2017-12-03: 100 ug via INTRAVENOUS
  Filled 2017-12-03: qty 2

## 2017-12-03 MED ORDER — HYDROCODONE-ACETAMINOPHEN 5-325 MG PO TABS
1.0000 | ORAL_TABLET | Freq: Once | ORAL | Status: AC
  Administered 2017-12-03: 1 via ORAL
  Filled 2017-12-03: qty 1

## 2017-12-03 MED ORDER — IOPAMIDOL (ISOVUE-300) INJECTION 61%: 75.0000 mL | Freq: Once | INTRAVENOUS | Status: DC | PRN

## 2017-12-03 MED ORDER — CLINDAMYCIN HCL 300 MG PO CAPS: 300.0000 mg | ORAL_CAPSULE | Freq: Four times a day (QID) | ORAL | 0 refills | Status: DC

## 2017-12-03 MED ORDER — ONDANSETRON HCL 4 MG/2ML IJ SOLN
4.0000 mg | Freq: Once | INTRAMUSCULAR | Status: AC
  Administered 2017-12-03: 4 mg via INTRAVENOUS
  Filled 2017-12-03: qty 2

## 2017-12-03 NOTE — ED Triage Notes (Signed)
Right side facial abscess noted x 3 days. N/v started today x 4. Redness/swelling and open wound areas noted.

## 2017-12-03 NOTE — ED Provider Notes (Signed)
[facility] EMERGENCY DEPARTMENT Provider Note   CSN: [id number] Arrival date & time: 12/03/17  1803     History   Chief Complaint Chief Complaint  Patient presents with  . Abscess    HPI Jacqueline Alvarado is a 22 y.o. female.   Abscess  Location:  Face (likely cellulitis) Facial abscess location:  Face Size:  2 cm Abscess quality: draining and induration   Red streaking: no   Duration:  3 days Progression:  Worsening Chronicity:  New Relieved by:  None tried Worsened by:  Nothing Ineffective treatments:  None tried Associated symptoms: no nausea and no vomiting     Past Medical History:  Diagnosis Date  . Abdominal pain, chronic, right upper quadrant   . Abnormal Pap smear of cervix   . Anemia    iron deficiency  . Anxiety   . Depression   . Headache(784.0)   . PONV (postoperative nausea and vomiting)    Hx: of nausea only at age 78    Patient Active Problem List   Diagnosis Date Noted  . Acute cholecystitis 02/01/2017  . Calculus of gallbladder with acute cholecystitis without obstruction   . Biliary colic   . Abdominal pain 01/29/2017  . Gallstones   . Inflammation of the retroperitoneum   . Cicatrix   . Rectal bleeding 12/20/2015  . RUQ pain 12/20/2015  . Hemorrhoids 12/20/2015  . Contraceptive management 09/10/2015  . Trichomonal vaginitis 09/10/2015  . BV (bacterial vaginosis) 09/10/2015  . Hypertrophy of nasal turbinates 06/24/2013  . Tonsillar hypertrophy 06/24/2013  . Obstructive sleep apnea (adult) (pediatric) 06/24/2013  . Morbid obesity (HCC) 06/24/2013    Past Surgical History:  Procedure Laterality Date  . CHOLECYSTECTOMY N/A 02/02/2017   Procedure: LAPAROSCOPIC CHOLECYSTECTOMY;  Surgeon: Franky Macho, MD;  Location: [facility] ORS;  Service: General;  Laterality: N/A;  . COLONOSCOPY N/A 01/07/2016   Dr. Darrick Penna: Nonbleeding internal hemorrhoids, large. 3 bands successfully placed. Distal ileum containing multiple ulcers which she felt was  related to NSAIDs. Biopsies nonspecific.  Marland Kitchen HEMORRHOID BANDING N/A 01/07/2016   Procedure: HEMORRHOID BANDING;  Surgeon: West Bali, MD;  Location: [facility] SUITE;  Service: Endoscopy;  Laterality: N/A;  . MASS EXCISION N/A 11/26/2016   Procedure: EXCISION OF ABDOMINAL CICATRIX 4CM;  Surgeon: Franky Macho, MD;  Location: [facility] ORS;  Service: General;  Laterality: N/A;  p knows to arrive at 7:50  . NASAL SEPTOPLASTY W/ TURBINOPLASTY Bilateral 06/24/2013   Procedure: TURBINATE REDUCTION;  Surgeon: Flo Shanks, MD;  Location: [facility] OR;  Service: ENT;  Laterality: Bilateral;  . TONSILLECTOMY AND ADENOIDECTOMY Bilateral 06/24/2013   Procedure: TONSILLECTOMY ;  Surgeon: Flo Shanks, MD;  Location: [facility] OR;  Service: ENT;  Laterality: Bilateral;  . TUMOR EXCISION     Hx: of right side of neck  . WISDOM TOOTH EXTRACTION       OB History   None      Home Medications    Prior to Admission medications   Medication Sig Start Date End Date Taking? Authorizing Provider  acetaminophen (TYLENOL) 500 MG tablet Take 1,000 mg by mouth every 6 (six) hours as needed for mild pain or headache.    [provider]  clindamycin (CLEOCIN) 300 MG capsule Take 1 capsule (300 mg total) by mouth 4 (four) times daily. X 7 days 12/03/17   Davonta Stroot, Barbara Cower, MD  clonazePAM (KLONOPIN) 1 MG tablet Take 1 mg by mouth 2 (two) times daily as needed for anxiety.  10/01/16  [provider]  escitalopram (LEXAPRO) 20 MG tablet Take 20 mg by mouth daily.  10/01/16   [provider]  HYDROcodone-acetaminophen (NORCO/VICODIN) 5-325 MG tablet Take 1 tablet by mouth every 6 (six) hours as needed for severe pain. 12/03/17   Chastelyn Athens, Barbara Cower, MD  hydrocortisone (ANUSOL-HC) 25 MG suppository Place 1 suppository (25 mg total) rectally 2 (two) times daily. For 7 days 08/26/17   Dione Booze, MD  ibuprofen (ADVIL,MOTRIN) 200 MG tablet Take 400-600 mg by mouth every 6 (six) hours as needed for headache or mild pain.     [provider]  norgestimate-ethinyl estradiol (MONONESSA) 0.25-35 MG-MCG tablet Take 1 tablet by mouth daily.    [provider]  pantoprazole (PROTONIX) 40 MG tablet Take 1 tablet by mouth daily. 01/23/17   [provider]    Family History Family History  Problem Relation Age of Onset  . Cancer - Other Mother   . Arthritis Mother   . Hypertension Father   . COPD Father   . Cancer - Cervical Other   . Diabetes Other   . Stroke Other     Social History Social History   Tobacco Use  . Smoking status: Former Smoker    Years: 1.00    Types: Cigarettes    Last attempt to quit: 10/22/2016    Years since quitting: 1.1  . Smokeless tobacco: Never Used  . Tobacco comment: smoked only 1-2 cig weekly for 1 year  Substance Use Topics  . Alcohol use: Yes    Alcohol/week: 0.0 oz    Comment: occasional  . Drug use: Yes    Frequency: 7.0 times per week    Types: Marijuana    Comment: 3x/week     Allergies   Patient has no known allergies.   Review of Systems Review of Systems  Gastrointestinal: Negative for nausea and vomiting.  All other systems reviewed and are negative.    Physical Exam Updated Vital Signs BP 117/65   Pulse 79   Temp 99.8 F (37.7 C) (Oral)   Resp 20   Ht  (1.727 m)   Wt (!) 157.4 kg (347 lb)   LMP 11/01/2017   SpO2 99%   BMI 52.76 kg/m   Physical Exam  Constitutional: She appears well-developed and well-nourished.  HENT:  Head: Normocephalic and atraumatic.  Eyes: Conjunctivae and EOM are normal.  Neck: Normal range of motion.  Submental swelling, likely lymphadenopathy.  Cardiovascular: Normal rate and regular rhythm.  Pulmonary/Chest: No stridor. No respiratory distress.  Abdominal: Soft. Bowel sounds are normal. She exhibits no distension.  Neurological: She is alert.  Skin: Skin is warm and dry. There is erythema (and idnuration to right cheek with mild amount of draining fluid. ).  Nursing note and  vitals reviewed.    ED Treatments / Results  Labs (all labs ordered are listed, but only abnormal results are displayed) Labs Reviewed  I-STAT CHEM 8, ED - Abnormal; Notable for the following components:      Result Value   Calcium, Ion 1.06 (*)    All other components within normal limits  I-STAT BETA HCG BLOOD, ED ([facility], [facility], [facility] ONLY)    EKG None  Radiology No results found.  Procedures Procedures (including critical care time)  Medications Ordered in ED Medications  lactated ringers bolus 1,000 mL (1,000 mLs Intravenous New Bag/Given 12/03/17 2042)  iopamidol (ISOVUE-300) 61 % injection 75 mL ( Intravenous Canceled Entry 12/03/17 2206)  clindamycin (CLEOCIN) IVPB 600  mg (0 mg Intravenous Stopped 12/03/17 2111)  fentaNYL (SUBLIMAZE) injection 100 mcg (100 mcg Intravenous Given 12/03/17 2106)  ondansetron (ZOFRAN) injection 4 mg (4 mg Intravenous Given 12/03/17 2106)  iohexol (OMNIPAQUE) 300 MG/ML solution 75 mL (75 mLs Intravenous Contrast Given 12/03/17 2218)     Initial Impression / Assessment and Plan / ED Course  I have reviewed the triage vital signs and the nursing notes.  Pertinent labs & imaging results that were available during my care of the patient were reviewed by me and considered in my medical decision making (see chart for details).     Suspect cellulitis but with swelling and feeling of extension to below chin, will CT to ensure no fluid collections. If negative, stable for dc w/ clinda/pain control/nausea meds.   Care transferred to Dr. Deretha Emory pending CT and likely discharge.   Final Clinical Impressions(s) / ED Diagnoses   Final diagnoses:  Cellulitis of face    ED Discharge Orders        Ordered    clindamycin (CLEOCIN) 300 MG capsule  4 times daily     12/03/17 2213    HYDROcodone-acetaminophen (NORCO/VICODIN) 5-325 MG tablet  Every 6 hours PRN     12/03/17 2213       Reanna Scoggin, Barbara Cower, MD 12/03/17 2224

## 2017-12-03 NOTE — ED Provider Notes (Signed)
CT scan as below.  Seems to be consistent with just a cellulitis no deep space infection no evidence of any significant abscess.  But everything is from the limitation on her right cheek area.  Patient nontoxic no acute distress.  Patient will be treated with outpatient antibiotics.  She will return for any new or worse symptoms.  Again no evidence of any abscess.  Results for orders placed or performed during the hospital encounter of 12/03/17  I-Stat Chem 8, ED  Result Value Ref Range   Sodium 139 135 - 145 mmol/L   Potassium 5.1 3.5 - 5.1 mmol/L   Chloride 105 101 - 111 mmol/L   BUN 11 6 - 20 mg/dL   Creatinine, Ser 1.61 0.44 - 1.00 mg/dL   Glucose, Bld 96 65 - 99 mg/dL   Calcium, Ion 0.96 (L) 1.15 - 1.40 mmol/L   TCO2 25 22 - 32 mmol/L   Hemoglobin 13.6 12.0 - 15.0 g/dL   HCT 04.5 40.9 - 81.1 %  I-Stat Beta hCG blood, ED ([facility], [facility], [facility] only)  Result Value Ref Range   I-stat hCG, quantitative <5.0 <5 mIU/mL   Comment 3           Ct Maxillofacial W Contrast  Result Date: 12/03/2017 CLINICAL DATA:  Initial evaluation for acute right-sided facial swelling, abscess. EXAM: CT MAXILLOFACIAL WITH CONTRAST TECHNIQUE: Multidetector CT imaging of the maxillofacial structures was performed with intravenous contrast. Multiplanar CT image reconstructions were also generated. CONTRAST:  <See Chart> ISOVUE-300 IOPAMIDOL (ISOVUE-300) INJECTION 61%, 75mL OMNIPAQUE IOHEXOL 300 MG/ML SOLN COMPARISON:  None available. FINDINGS: Osseous: No acute osseous abnormality about the face. No significant dental disease identified. Orbits: Globes and orbital soft tissues within normal limits. Sinuses: Mild scattered mucosal thickening within the ethmoidal air cells. Paranasal sinuses are otherwise clear. Mastoid air cells and middle ear cavities are well pneumatized and free of fluid. Soft tissues: Asymmetric soft tissue swelling with inflammatory stranding within the right masticator space, suspicious for acute cellulitis.  Mild extension into the pre maxillary soft tissues. Asymmetric thickening of the right platysmas. Overlying skin thickening noted. There is a slightly more focal 1 cm area of phlegmon within the subcutaneous fat of the right masticator space without frank or discrete abscess (series 2, image 63). No other discrete abscess or drainable fluid collection identified. Enlarged right level IB node measures 15 mm, likely reactive. No other acute abnormality within the face and visualized neck. Surgical clips noted along the anterior margin of the right sternocleidomastoid muscle. Limited intracranial: Unremarkable. IMPRESSION: 1. Asymmetric soft tissue swelling with inflammatory stranding primarily involving the right masticator space, consistent with acute cellulitis. Superimposed focal 1 cm area of phlegmon within the subcutaneous fat without frank abscess or other drainable fluid collection. 2. Enlarged reactive 15 mm right level IB lymph node. Electronically Signed   By: Rise Mu M.D.   On: 12/03/2017 22:37      Vanetta Mulders, MD 12/03/17 2253

## 2018-07-10 ENCOUNTER — Emergency Department (HOSPITAL_COMMUNITY)
Admission: EM | Admit: 2018-07-10 | Discharge: 2018-07-10 | Disposition: A | Discharge status: Home / Self Care | Payer: No Typology Code available for payment source | Attending: Emergency Medicine | Admitting: Emergency Medicine

## 2018-07-10 ENCOUNTER — Emergency Department (HOSPITAL_COMMUNITY): Payer: No Typology Code available for payment source

## 2018-07-10 ENCOUNTER — Other Ambulatory Visit: Payer: Self-pay

## 2018-07-10 ENCOUNTER — Encounter (HOSPITAL_COMMUNITY): Payer: Self-pay | Admitting: Emergency Medicine

## 2018-07-10 DIAGNOSIS — R1110 Vomiting, unspecified: Secondary | ICD-10-CM | POA: Diagnosis present

## 2018-07-10 DIAGNOSIS — R69 Illness, unspecified: Secondary | ICD-10-CM

## 2018-07-10 DIAGNOSIS — Z87891 Personal history of nicotine dependence: Secondary | ICD-10-CM | POA: Diagnosis not present

## 2018-07-10 DIAGNOSIS — Z79899 Other long term (current) drug therapy: Secondary | ICD-10-CM | POA: Diagnosis not present

## 2018-07-10 DIAGNOSIS — J111 Influenza due to unidentified influenza virus with other respiratory manifestations: Secondary | ICD-10-CM | POA: Diagnosis not present

## 2018-07-10 LAB — COMPREHENSIVE METABOLIC PANEL
ALK PHOS: 30 U/L — AB (ref 38–126)
ALT: 21 U/L (ref 0–44)
AST: 21 U/L (ref 15–41)
Albumin: 3.4 g/dL — ABNORMAL LOW (ref 3.5–5.0)
Anion gap: 8 (ref 5–15)
BILIRUBIN TOTAL: 0.6 mg/dL (ref 0.3–1.2)
BUN: 10 mg/dL (ref 6–20)
CALCIUM: 8.3 mg/dL — AB (ref 8.9–10.3)
CO2: 22 mmol/L (ref 22–32)
CREATININE: 0.69 mg/dL (ref 0.44–1.00)
Chloride: 105 mmol/L (ref 98–111)
Glucose, Bld: 113 mg/dL — ABNORMAL HIGH (ref 70–99)
Potassium: 3.3 mmol/L — ABNORMAL LOW (ref 3.5–5.1)
Sodium: 135 mmol/L (ref 135–145)
TOTAL PROTEIN: 7 g/dL (ref 6.5–8.1)

## 2018-07-10 LAB — PREGNANCY, URINE: PREG TEST UR: NEGATIVE

## 2018-07-10 LAB — INFLUENZA PANEL BY PCR (TYPE A & B)
Influenza A By PCR: NEGATIVE
Influenza B By PCR: NEGATIVE

## 2018-07-10 LAB — CBC
HCT: 38.5 % (ref 36.0–46.0)
Hemoglobin: 11.8 g/dL — ABNORMAL LOW (ref 12.0–15.0)
MCH: 22.6 pg — ABNORMAL LOW (ref 26.0–34.0)
MCHC: 30.6 g/dL (ref 30.0–36.0)
MCV: 73.6 fL — AB (ref 80.0–100.0)
NRBC: 0 % (ref 0.0–0.2)
Platelets: 256 10*3/uL (ref 150–400)
RBC: 5.23 MIL/uL — ABNORMAL HIGH (ref 3.87–5.11)
RDW: 15.8 % — ABNORMAL HIGH (ref 11.5–15.5)
WBC: 10.4 10*3/uL (ref 4.0–10.5)

## 2018-07-10 LAB — URINALYSIS, ROUTINE W REFLEX MICROSCOPIC
Bilirubin Urine: NEGATIVE
GLUCOSE, UA: NEGATIVE mg/dL
Hgb urine dipstick: NEGATIVE
KETONES UR: NEGATIVE mg/dL
LEUKOCYTES UA: NEGATIVE
Nitrite: NEGATIVE
PROTEIN: NEGATIVE mg/dL
Specific Gravity, Urine: 1.021 (ref 1.005–1.030)
pH: 6 (ref 5.0–8.0)

## 2018-07-10 LAB — MONONUCLEOSIS SCREEN: Mono Screen: NEGATIVE

## 2018-07-10 LAB — LIPASE, BLOOD: Lipase: 27 U/L (ref 11–51)

## 2018-07-10 MED ORDER — ONDANSETRON 4 MG PO TBDP: 4.0000 mg | ORAL_TABLET | Freq: Three times a day (TID) | ORAL | 0 refills | Status: DC | PRN

## 2018-07-10 MED ORDER — KETOROLAC TROMETHAMINE 30 MG/ML IJ SOLN
15.0000 mg | Freq: Once | INTRAMUSCULAR | Status: AC
  Administered 2018-07-10: 15 mg via INTRAVENOUS
  Filled 2018-07-10: qty 1

## 2018-07-10 MED ORDER — ONDANSETRON HCL 4 MG/2ML IJ SOLN
INTRAMUSCULAR | Status: AC
  Administered 2018-07-10: 4 mg
  Filled 2018-07-10: qty 2

## 2018-07-10 MED ORDER — SODIUM CHLORIDE 0.9 % IV BOLUS
1000.0000 mL | Freq: Once | INTRAVENOUS | Status: AC
  Administered 2018-07-10: 1000 mL via INTRAVENOUS

## 2018-07-10 NOTE — ED Triage Notes (Signed)
Patient c/o nausea, vomiting, diarrhea, sore throat, bilateral ear pain, headache, neck soreness, body aches, and intermittent fevers. Patient reports taking tylenol and zofran at 9am. Per patient hasn't vomited since taking zofran. Patient states felt "bad last week but didn't have vomiting-vomiting a started last night.

## 2018-07-10 NOTE — ED Notes (Signed)
Urine spec to lab 

## 2018-07-10 NOTE — ED Notes (Signed)
Pt returned from xray

## 2018-07-10 NOTE — Discharge Instructions (Signed)
As discussed, your evaluation today has been largely reassuring.  But, it is important that you monitor your condition carefully, and do not hesitate to return to the ED if you develop new, or concerning changes in your condition. ? ?Otherwise, please follow-up with your physician for appropriate ongoing care. ? ?

## 2018-07-10 NOTE — ED Provider Notes (Signed)
[name] Provider Note   CSN: [id number] Arrival date & time: 07/10/18  1250     History   Chief Complaint Chief Complaint  Patient presents with  . Emesis    HPI Jacqueline Alvarado is a 22 y.o. female.  HPI Patient presents with concern of 2 weeks of illness, worse over the past 24 hours. She notes that during this illness she has had soreness, nausea, anorexia, vomiting yesterday. She has had intermittent fevers, with only mild control with Tylenol, Zofran. Patient did not receive a flu shot this year. She denies chest pain, dyspnea. She states that other than being obese she is generally well. She does not smoke, drinks minimally.  Past Medical History:  Diagnosis Date  . Abdominal pain, chronic, right upper quadrant   . Abnormal Pap smear of cervix   . Anemia    iron deficiency  . Anxiety   . BPD (bronchopulmonary dysplasia)   . Depression   . Headache(784.0)   . PONV (postoperative nausea and vomiting)    Hx: of nausea only at age 22    Patient Active Problem List   Diagnosis Date Noted  . Acute cholecystitis 02/01/2017  . Calculus of gallbladder with acute cholecystitis without obstruction   . Biliary colic   . Abdominal pain 01/29/2017  . Gallstones   . Inflammation of the retroperitoneum   . Cicatrix   . Rectal bleeding 12/20/2015  . RUQ pain 12/20/2015  . Hemorrhoids 12/20/2015  . Contraceptive management 09/10/2015  . Trichomonal vaginitis 09/10/2015  . BV (bacterial vaginosis) 09/10/2015  . Hypertrophy of nasal turbinates 06/24/2013  . Tonsillar hypertrophy 06/24/2013  . Obstructive sleep apnea (adult) (pediatric) 06/24/2013  . Morbid obesity (HCC) 06/24/2013    Past Surgical History:  Procedure Laterality Date  . CHOLECYSTECTOMY N/A 02/02/2017   Procedure: LAPAROSCOPIC CHOLECYSTECTOMY;  Surgeon: Franky MachoJenkins, Mark, MD;  Location: [facility] ORS;  Service: General;  Laterality: N/A;  . COLONOSCOPY N/A 01/07/2016   Dr. Darrick Pennafields:  Nonbleeding internal hemorrhoids, large. 3 bands successfully placed. Distal ileum containing multiple ulcers which she felt was related to NSAIDs. Biopsies nonspecific.  Marland Kitchen. HEMORRHOID BANDING N/A 01/07/2016   Procedure: HEMORRHOID BANDING;  Surgeon: West BaliSandi L Fields, MD;  Location: [facility]ITE;  Service: Endoscopy;  Laterality: N/A;  . MASS EXCISION N/A 11/26/2016   Procedure: EXCISION OF ABDOMINAL CICATRIX 4CM;  Surgeon: Franky MachoJenkins, Mark, MD;  Location: [facility] ORS;  Service: General;  Laterality: N/A;  p knows to arrive at 7:50  . NASAL SEPTOPLASTY W/ TURBINOPLASTY Bilateral 06/24/2013   Procedure: TURBINATE REDUCTION;  Surgeon: Flo ShanksKarol Wolicki, MD;  Location: [facility] OR;  Service: ENT;  Laterality: Bilateral;  . TONSILLECTOMY AND ADENOIDECTOMY Bilateral 06/24/2013   Procedure: TONSILLECTOMY ;  Surgeon: Flo ShanksKarol Wolicki, MD;  [facility];  Service: ENT;  Laterality: Bilateral;  . TUMOR EXCISION     Hx: of right side of neck  . WISDOM TOOTH EXTRACTION       OB History   No obstetric history on file.      Home Medications    Prior to Admission medications   Medication Sig Start Date End Date Taking? Authorizing Provider  acetaminophen (TYLENOL) 500 MG tablet Take 1,000 mg by mouth every 6 (six) hours as needed for mild pain or headache.    [provider]  clindamycin (CLEOCIN) 300 MG capsule Take 1 capsule (300 mg total) by mouth 4 (four) times daily. X 7 days Patient not taking: Reported on 07/10/2018 12/03/17  Mesner, Barbara CowerJason, MD  clonazePAM (KLONOPIN) 1 MG tablet Take 1 mg by mouth 2 (two) times daily as needed for anxiety.  10/01/16   [provider]  escitalopram (LEXAPRO) 20 MG tablet Take 20 mg by mouth daily.  10/01/16   [provider]  HYDROcodone-acetaminophen (NORCO/VICODIN) 5-325 MG tablet Take 1 tablet by mouth every 6 (six) hours as needed for severe pain. 12/03/17   Mesner, Barbara CowerJason, MD  hydrocortisone (ANUSOL-HC) 25 MG suppository Place 1 suppository (25 mg total)  rectally 2 (two) times daily. For 7 days 08/26/17   Dione BoozeGlick, David, MD  ibuprofen (ADVIL,MOTRIN) 200 MG tablet Take 400-600 mg by mouth every 6 (six) hours as needed for headache or mild pain.    [provider]  norgestimate-ethinyl estradiol (MONONESSA) 0.25-35 MG-MCG tablet Take 1 tablet by mouth daily.    [provider]  pantoprazole (PROTONIX) 40 MG tablet Take 1 tablet by mouth daily. 01/23/17   [provider]    Family History Family History  Problem Relation Age of Onset  . Cancer - Other Mother   . Arthritis Mother   . Hypertension Father   . COPD Father   . Cancer - Cervical Other   . Diabetes Other   . Stroke Other     Social History Social History   Tobacco Use  . Smoking status: Former Smoker    Years: 1.00    Types: Cigarettes    Last attempt to quit: 10/22/2016    Years since quitting: 1.7  . Smokeless tobacco: Never Used  . Tobacco comment: smoked only 1-2 cig weekly for 1 year  Substance Use Topics  . Alcohol use: Yes    Alcohol/week: 0.0 standard drinks    Comment: occasional  . Drug use: Yes    Frequency: 7.0 times per week    Types: Marijuana    Comment: 3x/week     Allergies   Patient has no known allergies.   Review of Systems Review of Systems  Constitutional: Positive for fatigue.  HENT:       Per HPI, otherwise negative  Respiratory:       Per HPI, otherwise negative  Cardiovascular:       Per HPI, otherwise negative  Gastrointestinal: Positive for nausea and vomiting. Negative for abdominal pain.  Endocrine:       Negative aside from HPI  Genitourinary:       Neg aside from HPI   Musculoskeletal:       Per HPI, otherwise negative  Skin: Negative.   Neurological: Positive for weakness. Negative for syncope.     Physical Exam Updated Vital Signs BP (!) 158/77 (BP Location: Right Arm)   Pulse 70   Temp 99.4 F (37.4 C) (Oral)   Resp 18   Ht 5\' 8"  (1.727 m)   Wt (!) 161.5 kg   LMP 06/16/2018  (Approximate)   SpO2 100%   BMI 54.13 kg/m   Physical Exam Vitals signs and nursing note reviewed.  Constitutional:      General: She is not in acute distress.    Appearance: She is well-developed.  HENT:     Head: Normocephalic and atraumatic.     Mouth/Throat:     Pharynx: No oropharyngeal exudate or posterior oropharyngeal erythema.  Eyes:     Conjunctiva/sclera: Conjunctivae normal.  Cardiovascular:     Rate and Rhythm: Normal rate and regular rhythm.  Pulmonary:     Effort: Pulmonary effort is normal. No respiratory distress.  Breath sounds: Normal breath sounds. No stridor.  Abdominal:     General: There is no distension.     Tenderness: There is no abdominal tenderness. There is no guarding.  Lymphadenopathy:     Cervical: Cervical adenopathy present.  Skin:    General: Skin is warm and dry.  Neurological:     Mental Status: She is alert and oriented to person, place, and time.     Cranial Nerves: No cranial nerve deficit.      ED Treatments / Results  Labs (all labs ordered are listed, but only abnormal results are displayed) Labs Reviewed  COMPREHENSIVE METABOLIC PANEL - Abnormal; Notable for the following components:      Result Value   Potassium 3.3 (*)    Glucose, Bld 113 (*)    Calcium 8.3 (*)    Albumin 3.4 (*)    Alkaline Phosphatase 30 (*)    All other components within normal limits  CBC - Abnormal; Notable for the following components:   RBC 5.23 (*)    Hemoglobin 11.8 (*)    MCV 73.6 (*)    MCH 22.6 (*)    RDW 15.8 (*)    All other components within normal limits  LIPASE, BLOOD  URINALYSIS, ROUTINE W REFLEX MICROSCOPIC  INFLUENZA PANEL BY PCR (TYPE A & B)  PREGNANCY, URINE  MONONUCLEOSIS SCREEN    EKG None  Radiology Dg Chest 2 View  Result Date: 07/10/2018 CLINICAL DATA:  Per ED notes: Patient c/o nausea, vomiting, diarrhea, sore throat, bilateral ear pain, headache, neck soreness, body aches, and intermittent fevers. Patient  reports taking Tylenol and Zofran at 9am. Per patient hasn't vomited since. EXAM: CHEST - 2 VIEW COMPARISON:  Chest x-ray on 10/17/2005 FINDINGS: Heart is normal. Lungs are clear. No pulmonary edema. Surgical clips are noted in the RIGHT neck and RIGHT UPPER QUADRANT the abdomen. IMPRESSION: No active cardiopulmonary disease. Electronically Signed   By: Norva Pavlov M.D.   On: 07/10/2018 16:31    Procedures Procedures (including critical care time)  Medications Ordered in ED Medications  sodium chloride 0.9 % bolus 1,000 mL (1,000 mLs Intravenous New Bag/Given 07/10/18 1602)  ketorolac (TORADOL) 30 MG/ML injection 15 mg (15 mg Intravenous Given 07/10/18 1602)     Initial Impression / Assessment and Plan / ED Course  I have reviewed the triage vital signs and the nursing notes.  Pertinent labs & imaging results that were available during my care of the patient were reviewed by me and considered in my medical decision making (see chart for details).     5:13 PM Patient is awake, alert, no distress, sitting upright, speaking clearly. All labs reviewed, then discussed with the patient. No evidence for pneumonia, no evidence for influenza, mono. No physical exam evidence for strep. Patient's vitals are unremarkable, she is in no distress, she may be suffering from influenza-like illness, with no evidence for bacteremia, sepsis, no acute distress, patient encouraged to follow-up closely with outpatient providers.   Final Clinical Impressions(s) / ED Diagnoses   Final diagnoses:  Influenza-like illness    ED Discharge Orders         Ordered    ondansetron (ZOFRAN ODT) 4 MG disintegrating tablet  Every 8 hours PRN     07/10/18 1714           Gerhard Munch, MD 07/10/18 1714

## 2018-07-13 ENCOUNTER — Encounter: Payer: Self-pay | Admitting: Advanced Practice Midwife

## 2018-07-13 ENCOUNTER — Other Ambulatory Visit: Payer: Self-pay

## 2018-07-13 ENCOUNTER — Ambulatory Visit (INDEPENDENT_AMBULATORY_CARE_PROVIDER_SITE_OTHER): Payer: PRIVATE HEALTH INSURANCE | Admitting: Advanced Practice Midwife

## 2018-07-13 VITALS — BP 140/93 | HR 79 | Ht 68.0 in | Wt 354.0 lb

## 2018-07-13 DIAGNOSIS — N898 Other specified noninflammatory disorders of vagina: Secondary | ICD-10-CM | POA: Diagnosis not present

## 2018-07-13 MED ORDER — FLUCONAZOLE 150 MG PO TABS: ORAL_TABLET | ORAL | 2 refills | Status: DC

## 2018-07-13 MED ORDER — NYSTATIN-TRIAMCINOLONE 100000-0.1 UNIT/GM-% EX OINT: 1.0000 "application " | TOPICAL_OINTMENT | Freq: Two times a day (BID) | CUTANEOUS | 0 refills | Status: DC

## 2018-07-13 NOTE — Patient Instructions (Signed)
Hidradenitis Suppurativa  Hidradenitis suppurativa is a long-term (chronic) skin disease. It is similar to a severe form of acne, but it affects areas of the body where acne would be unusual, especially areas of the body where skin rubs against skin and becomes moist. These include:  · Underarms.  · Groin.  · Genital area.  · Buttocks.  · Upper thighs.  · Breasts.  Hidradenitis suppurativa may start out as small lumps or pimples caused by blocked sweat glands or hair follicles. Pimples may develop into deep sores that break open (rupture) and drain pus. Over time, affected areas of skin may thicken and become scarred. This condition is rare and does not spread from person to person (non-contagious).  What are the causes?  The exact cause of this condition is not known. It may be related to:  · Female and female hormones.  · An overactive disease-fighting system (immune system). The immune system may over-react to blocked hair follicles or sweat glands and cause swelling and pus-filled sores.  What increases the risk?  You are more likely to develop this condition if you:  · Are female.  · Are 11-55 years old.  · Have a family history of hidradenitis suppurativa.  · Have a personal history of acne.  · Are overweight.  · Smoke.  · Take the medicine lithium.  What are the signs or symptoms?  The first symptoms are usually painful bumps in the skin, similar to pimples. The condition may get worse over time (progress), or it may only cause mild symptoms. If the disease progresses, symptoms may include:  · Skin bumps getting bigger and growing deeper into the skin.  · Bumps rupturing and draining pus.  · Itchy, infected skin.  · Skin getting thicker and scarred.  · Tunnels under the skin (fistulas) where pus drains from a bump.  · Pain during daily activities, such as pain during walking if your groin area is affected.  · Emotional problems, such as stress or depression. This condition may affect your appearance and your  ability or willingness to wear certain clothes or do certain activities.  How is this diagnosed?  This condition is diagnosed by a health care provider who specializes in skin diseases (dermatologist). You may be diagnosed based on:  · Your symptoms and medical history.  · A physical exam.  · Testing a pus sample for infection.  · Blood tests.  How is this treated?  Your treatment will depend on how severe your symptoms are. The same treatment will not work for everybody with this condition. You may need to try several treatments to find what works best for you. Treatment may include:  · Cleaning and bandaging (dressing) your wounds as needed.  · Lifestyle changes, such as new skin care routines.  · Taking medicines, such as:  ? Antibiotics.  ? Acne medicines.  ? Medicines to reduce the activity of the immune system.  ? A diabetes medicine (metformin).  ? Birth control pills, for women.  ? Steroids to reduce swelling and pain.  · Working with a mental health care provider, if you experience emotional distress due to this condition.  If you have severe symptoms that do not get better with medicine, you may need surgery. Surgery may involve:  · Using a laser to clear the skin and remove hair follicles.  · Opening and draining deep sores.  · Removing the areas of skin that are diseased and scarred.  Follow these instructions at home:    Medicines    · Take over-the-counter and prescription medicines only as told by your health care provider.  · If you were prescribed an antibiotic medicine, take it as told by your health care provider. Do not stop taking the antibiotic even if your condition improves.  Skin care  · If you have open wounds, cover them with a clean dressing as told by your health care provider. Keep wounds clean by washing them gently with soap and water when you bathe.  · Do not shave the areas where you get hidradenitis suppurativa.  · Do not wear deodorant.  · Wear loose-fitting clothes.  · Try to avoid  getting overheated or sweaty. If you get sweaty or wet, change into clean, dry clothes as soon as you can.  · To help relieve pain and itchiness, cover sore areas with a warm, clean washcloth (warm compress) for 5-10 minutes as often as needed.  · If told by your health care provider, take a bleach bath twice a week:  ? Fill your bathtub halfway with water.  ? Pour in ½ cup of unscented household bleach.  ? Soak in the tub for 5-10 minutes.  ? Only soak from the neck down. Avoid water on your face and hair.  ? Shower to rinse off the bleach from your skin.  General instructions  · Learn as much as you can about your disease so that you have an active role in your treatment. Work closely with your health care provider to find treatments that work for you.  · If you are overweight, work with your health care provider to lose weight as recommended.  · Do not use any products that contain nicotine or tobacco, such as cigarettes and e-cigarettes. If you need help quitting, ask your health care provider.  · If you struggle with living with this condition, talk with your health care provider or work with a mental health care provider as recommended.  · Keep all follow-up visits as told by your health care provider. This is important.  Where to find more information  · Hidradenitis Suppurativa [employer].: https://www.hs-foundation.org/  Contact a health care provider if you have:  · A flare-up of hidradenitis suppurativa.  · A fever or chills.  · Trouble controlling your symptoms at home.  · Trouble doing your daily activities because of your symptoms.  · Trouble dealing with emotional problems related to your condition.  Summary  · Hidradenitis suppurativa is a long-term (chronic) skin disease. It is similar to a severe form of acne, but it affects areas of the body where acne would be unusual.  · The first symptoms are usually painful bumps in the skin, similar to pimples. The condition may get worse over time  (progress), or it may only cause mild symptoms.  · If you have open wounds, cover them with a clean dressing as told by your health care provider. Keep wounds clean by washing them gently with soap and water when you bathe.  · Besides skin care, treatment may include medicines, laser treatment, and surgery.  This information is not intended to replace advice given to you by your health care provider. Make sure you discuss any questions you have with your health care provider.  Document Released: 02/12/2004 Document Revised: 07/08/2017 Document Reviewed: 07/08/2017  Elsevier Interactive Patient Education © 2019 [employer]

## 2018-07-13 NOTE — Progress Notes (Signed)
CHIEF COMPLAINT/HPI:  22 y.o. female complains of white vaginal discharge for 4 days . Denies abnormal vaginal bleeding, significant pelvic pain or fever.  + itch, + irritation, no odor.  Also has a "rash" that is itchy, also for about 4 days.  Has multipile "boils" on inner thighs/groin area, chronically, occ drains but usually is firm.  Hasn't been voiding much over the past few days, but had a GI bug a few days ago, improving. Sister has hydranitis suprativa, gets injections.  No boils anywhere else.  Denies history of known exposure to STD or symptoms in partner.  Patient's last menstrual period was 06/18/2018.  Has not tried an OTC product.   Review of Systems  Constitutional: Negative for fever and chills Eyes: Negative for visual disturbances Respiratory: Negative for shortness of breath, dyspnea Cardiovascular: Negative for chest pain or palpitations  Gastrointestinal: Negative for vomiting, diarrhea (in several days) and constipation Genitourinary: Negative for dysuria and urgency Musculoskeletal: Negative for back pain, joint pain, myalgias  Neurological: Negative for dizziness and headaches    Past Medical History: Past Medical History:  Diagnosis Date  . Abdominal pain, chronic, right upper quadrant   . Abnormal Pap smear of cervix   . Anemia    iron deficiency  . Anxiety   . BPD (bronchopulmonary dysplasia)   . Depression   . Headache(784.0)   . PONV (postoperative nausea and vomiting)    Hx: of nausea only at age 22    Past Surgical History: Past Surgical History:  Procedure Laterality Date  . CHOLECYSTECTOMY N/A 02/02/2017   Procedure: LAPAROSCOPIC CHOLECYSTECTOMY;  Surgeon: Franky MachoJenkins, Mark, MD;  Location: [facility];  Service: General;  Laterality: N/A;  . COLONOSCOPY N/A 01/07/2016   Dr. Darrick Pennafields: Nonbleeding internal hemorrhoids, large. 3 bands successfully placed. Distal ileum containing multiple ulcers which she felt was related to NSAIDs. Biopsies nonspecific.  Marland Kitchen.  HEMORRHOID BANDING N/A 01/07/2016   Procedure: HEMORRHOID BANDING;  Surgeon: West BaliSandi L Fields, MD;  Location: [facility];  Service: Endoscopy;  Laterality: N/A;  . MASS EXCISION N/A 11/26/2016   Procedure: EXCISION OF ABDOMINAL CICATRIX 4CM;  Surgeon: Franky MachoJenkins, Mark, MD;  Location: [facility];  Service: General;  Laterality: N/A;  p knows to arrive at 7:50  . NASAL SEPTOPLASTY W/ TURBINOPLASTY Bilateral 06/24/2013   Procedure: TURBINATE REDUCTION;  Surgeon: Flo ShanksKarol Wolicki, MD;  [facility];  Service: ENT;  Laterality: Bilateral;  . TONSILLECTOMY AND ADENOIDECTOMY Bilateral 06/24/2013   Procedure: TONSILLECTOMY ;  Surgeon: Flo ShanksKarol Wolicki, MD;  [facility];  Service: ENT;  Laterality: Bilateral;  . TUMOR EXCISION     Hx: of right side of neck  . WISDOM TOOTH EXTRACTION      Obstetrical History: OB History   No obstetric history on file.     Social History: Social History   Socioeconomic History  . Marital status: Single    Spouse name: Not on file  . Number of children: Not on file  . Years of education: Not on file  . Highest education level: Not on file  Occupational History  . 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 Financial resource strain: Not on file  . Food insecurity:    Worry: Not on file    Inability: Not on file  . Transportation needs:    Medical: Not on file    Non-medical: Not on file  Tobacco Use  . Smoking status: Former Smoker    Years: 1.00    Types:  Cigarettes    Last attempt to quit: 10/22/2016    Years since quitting: 1.7  . Smokeless tobacco: Never Used  . Tobacco comment: smoked only 1-2 cig weekly for 1 year  Substance and Sexual Activity  . Alcohol use: Yes    Alcohol/week: 0.0 standard drinks    Comment: occasional  . Drug use: Yes    Frequency: 7.0 times per week    Types: Marijuana    Comment: 3x/week  . Sexual activity: Yes    Birth control/protection: None, Condom  Lifestyle  . Physical activity:    Days  per week: Not on file    Minutes per session: Not on file  . Stress: Not on file  Relationships  . Social connections:    Talks on phone: Not on file    Gets together: Not on file    Attends religious service: Not on file    Active member of club or organization: Not on file    Attends meetings of clubs or organizations: Not on file    Relationship status: Not on file  Other Topics Concern  . Not on file  Social History Narrative  . Not on file    Family History: Family History  Problem Relation Age of Onset  . Cancer - Other Mother   . Arthritis Mother   . Hypertension Father   . COPD Father   . Cancer - Cervical Other   . Diabetes Other   . Stroke Other     Allergies: No Known Allergies      PHYSICAL EXAM: Physical Examination: General appearance - well appearing, and in no distress Mental status - alert, oriented to person, place, and time Chest:  Normal respiratory effort Heart - normal rate and regular rhythm Abdomen:  Soft, nontender Pelvic: SSE:  Thin white discharge, wet prep small amount of yeast trich, neg clue, neg wbc, neg.  Multiple scars from boils, has one on mons, firm ~ 1 cm, not abscessed. Yeast-like rash on labia majora.  Musculoskeletal:  Normal range of motion without pain Extremities:  No edema    Labs: No results found for this or any previous visit (from the past 24 hour(s)).   Assessment: Patient Active Problem List   Diagnosis Date Noted  . Acute cholecystitis 02/01/2017  . Calculus of gallbladder with acute cholecystitis without obstruction   . Biliary colic   . Abdominal pain 01/29/2017  . Gallstones   . Inflammation of the retroperitoneum   . Cicatrix   . Rectal bleeding 12/20/2015  . RUQ pain 12/20/2015  . Hemorrhoids 12/20/2015  . Contraceptive management 09/10/2015  . Trichomonal vaginitis 09/10/2015  . BV (bacterial vaginosis) 09/10/2015  . Hypertrophy of nasal turbinates 06/24/2013  . Tonsillar hypertrophy  06/24/2013  . Obstructive sleep apnea (adult) (pediatric) 06/24/2013  . Morbid obesity (HCC) 06/24/2013    Plan:  Orders Placed This Encounter  Procedures  . NuSwab Vaginitis Plus (VG+)   Hidranitis suprativa ? Yeast (only small amount, will treat)  Rx diflucan/mycolog   Says had 'abnormal pap" no record here.    Scarlette CalicoFrances Cresenzo-Dishmon 07/13/18 10:21 AM

## 2018-07-15 LAB — NUSWAB VAGINITIS PLUS (VG+)
Atopobium vaginae: HIGH Score — AB
BVAB 2: HIGH Score — AB
CHLAMYDIA TRACHOMATIS, NAA: NEGATIVE
Candida albicans, NAA: NEGATIVE
Candida glabrata, NAA: NEGATIVE
NEISSERIA GONORRHOEAE, NAA: NEGATIVE
TRICH VAG BY NAA: NEGATIVE

## 2018-07-16 ENCOUNTER — Telehealth: Payer: Self-pay | Admitting: Advanced Practice Midwife

## 2018-07-16 NOTE — Telephone Encounter (Signed)
Pt called requesting lab results. DOB verified. Informed of results. Pt verbalized understanding.

## 2018-07-16 NOTE — Telephone Encounter (Signed)
Patient called, stated that she had a culture done Tuesday, she'd like the results.  [phone]

## 2018-07-22 ENCOUNTER — Other Ambulatory Visit: Payer: Self-pay | Admitting: Advanced Practice Midwife

## 2018-07-22 MED ORDER — METRONIDAZOLE 500 MG PO TABS: 500.0000 mg | ORAL_TABLET | Freq: Two times a day (BID) | ORAL | 0 refills | Status: DC

## 2018-07-22 NOTE — Progress Notes (Signed)
Flagyl for BV on nuswab 

## 2018-08-19 ENCOUNTER — Other Ambulatory Visit: Payer: PRIVATE HEALTH INSURANCE | Admitting: Adult Health

## 2018-09-24 ENCOUNTER — Other Ambulatory Visit: Payer: Self-pay | Admitting: Adult Health

## 2018-10-14 ENCOUNTER — Emergency Department (HOSPITAL_COMMUNITY): Payer: Self-pay

## 2018-10-14 ENCOUNTER — Emergency Department (HOSPITAL_COMMUNITY)
Admission: EM | Admit: 2018-10-14 | Discharge: 2018-10-14 | Disposition: A | Discharge status: Home / Self Care | Payer: Self-pay | Attending: Emergency Medicine | Admitting: Emergency Medicine

## 2018-10-14 ENCOUNTER — Other Ambulatory Visit: Payer: Self-pay

## 2018-10-14 ENCOUNTER — Ambulatory Visit: Payer: Self-pay

## 2018-10-14 ENCOUNTER — Encounter (HOSPITAL_COMMUNITY): Payer: Self-pay | Admitting: Emergency Medicine

## 2018-10-14 DIAGNOSIS — Z79899 Other long term (current) drug therapy: Secondary | ICD-10-CM | POA: Insufficient documentation

## 2018-10-14 DIAGNOSIS — R590 Localized enlarged lymph nodes: Secondary | ICD-10-CM | POA: Insufficient documentation

## 2018-10-14 DIAGNOSIS — Z87891 Personal history of nicotine dependence: Secondary | ICD-10-CM | POA: Insufficient documentation

## 2018-10-14 LAB — CBC WITH DIFFERENTIAL/PLATELET
Abs Immature Granulocytes: 0.03 10*3/uL (ref 0.00–0.07)
Basophils Absolute: 0.1 10*3/uL (ref 0.0–0.1)
Basophils Relative: 1 %
Eosinophils Absolute: 0.4 10*3/uL (ref 0.0–0.5)
Eosinophils Relative: 3 %
HCT: 39 % (ref 36.0–46.0)
Hemoglobin: 12 g/dL (ref 12.0–15.0)
Immature Granulocytes: 0 %
Lymphocytes Relative: 31 %
Lymphs Abs: 3.6 10*3/uL (ref 0.7–4.0)
MCH: 22.7 pg — ABNORMAL LOW (ref 26.0–34.0)
MCHC: 30.8 g/dL (ref 30.0–36.0)
MCV: 73.7 fL — ABNORMAL LOW (ref 80.0–100.0)
Monocytes Absolute: 1 10*3/uL (ref 0.1–1.0)
Monocytes Relative: 9 %
Neutro Abs: 6.4 10*3/uL (ref 1.7–7.7)
Neutrophils Relative %: 56 %
Platelets: 304 10*3/uL (ref 150–400)
RBC: 5.29 MIL/uL — ABNORMAL HIGH (ref 3.87–5.11)
RDW: 16.7 % — ABNORMAL HIGH (ref 11.5–15.5)
WBC: 11.4 10*3/uL — ABNORMAL HIGH (ref 4.0–10.5)
nRBC: 0 % (ref 0.0–0.2)

## 2018-10-14 LAB — BASIC METABOLIC PANEL
Anion gap: 9 (ref 5–15)
BUN: 13 mg/dL (ref 6–20)
CO2: 23 mmol/L (ref 22–32)
Calcium: 8.7 mg/dL — ABNORMAL LOW (ref 8.9–10.3)
Chloride: 106 mmol/L (ref 98–111)
Creatinine, Ser: 0.76 mg/dL (ref 0.44–1.00)
GFR calc Af Amer: >60 mL/min (ref >60)
GFR calc non Af Amer: >60 mL/min (ref >60)
Glucose, Bld: 82 mg/dL (ref 70–99)
Potassium: 3.5 mmol/L (ref 3.5–5.1)
Sodium: 138 mmol/L (ref 135–145)

## 2018-10-14 LAB — POC URINE PREG, ED: Preg Test, Ur: NEGATIVE

## 2018-10-14 MED ORDER — IOHEXOL 300 MG/ML  SOLN
75.0000 mL | Freq: Once | INTRAMUSCULAR | Status: AC | PRN
  Administered 2018-10-14: 18:00:00 75 mL via INTRAVENOUS

## 2018-10-14 MED ORDER — HYDROCODONE-ACETAMINOPHEN 5-325 MG PO TABS: 1.0000 | ORAL_TABLET | ORAL | 0 refills | Status: DC | PRN

## 2018-10-14 MED ORDER — TRAMADOL HCL 50 MG PO TABS
50.0000 mg | ORAL_TABLET | Freq: Once | ORAL | Status: AC
  Administered 2018-10-14: 50 mg via ORAL
  Filled 2018-10-14: qty 1

## 2018-10-14 MED ORDER — IBUPROFEN 600 MG PO TABS: 600.0000 mg | ORAL_TABLET | Freq: Three times a day (TID) | ORAL | 0 refills | Status: DC

## 2018-10-14 MED ORDER — CEPHALEXIN 500 MG PO CAPS: 500.0000 mg | ORAL_CAPSULE | Freq: Four times a day (QID) | ORAL | 0 refills | Status: DC

## 2018-10-14 NOTE — ED Triage Notes (Signed)
Noticed a knot under her chin 2 days ago  Reports pulsating pain in to jaw and neck   Reports headache "every day for the last 3 weeks"  Off today so came for eval

## 2018-10-14 NOTE — ED Provider Notes (Signed)
[facility] EMERGENCY DEPARTMENT Provider Note   CSN: [id number] Arrival date & time: 10/14/18  1534    History   Chief Complaint Chief Complaint  Patient presents with   Cyst    HPI Justyn RUNETTE SONTAG is a 23 y.o. female with a history of iron deficiency anemia, anxiety, excised benign neck mass excised at 8 yoa, chronic headache presenting with a 2 day history of a tender "knot" beneath her chin which started out the size of a grape and has significantly enlarged.  The site is ttp and she feels pressure in her throat with attempts at swallowing. Denies fevers or chills, does endorse mild pain along her right lower molar teeth today but no gingival swelling or injury to these teeth.  The swelling does not wax and wane with salivation. She has had no treatment for this condition. Also endorses daily frontal headache, relieved with tylenol but returns.  Denies n/v/ vision changes, photophobia, nasal congestion, sinus pain and no neck stiffness or pain. Her voice has been more hoarse than normal.     The history is provided by the patient.    Past Medical History:  Diagnosis Date   Abdominal pain, chronic, right upper quadrant    Abnormal Pap smear of cervix    Anemia    iron deficiency   Anxiety    BPD (bronchopulmonary dysplasia)    Depression    Headache(784.0)    PONV (postoperative nausea and vomiting)    Hx: of nausea only at age 15    Patient Active Problem List   Diagnosis Date Noted   Acute cholecystitis 02/01/2017   Calculus of gallbladder with acute cholecystitis without obstruction    Biliary colic    Abdominal pain 01/29/2017   Gallstones    Inflammation of the retroperitoneum    Cicatrix    Rectal bleeding 12/20/2015   RUQ pain 12/20/2015   Hemorrhoids 12/20/2015   Contraceptive management 09/10/2015   Trichomonal vaginitis 09/10/2015   BV (bacterial vaginosis) 09/10/2015   Hypertrophy of nasal turbinates 06/24/2013   Tonsillar  hypertrophy 06/24/2013   Obstructive sleep apnea (adult) (pediatric) 06/24/2013   Morbid obesity (HCC) 06/24/2013    Past Surgical History:  Procedure Laterality Date   CHOLECYSTECTOMY N/A 02/02/2017   Procedure: LAPAROSCOPIC CHOLECYSTECTOMY;  Surgeon: Franky Macho, MD;  Location: [facility] ORS;  Service: General;  Laterality: N/A;   COLONOSCOPY N/A 01/07/2016   Dr. Darrick Penna: Nonbleeding internal hemorrhoids, large. 3 bands successfully placed. Distal ileum containing multiple ulcers which she felt was related to NSAIDs. Biopsies nonspecific.   HEMORRHOID BANDING N/A 01/07/2016   Procedure: HEMORRHOID BANDING;  Surgeon: West Bali, MD;  Location: [facility] SUITE;  Service: Endoscopy;  Laterality: N/A;   MASS EXCISION N/A 11/26/2016   Procedure: EXCISION OF ABDOMINAL CICATRIX 4CM;  Surgeon: Franky Macho, MD;  Location: [facility] ORS;  Service: General;  Laterality: N/A;  p knows to arrive at 7:50   NASAL SEPTOPLASTY W/ TURBINOPLASTY Bilateral 06/24/2013   Procedure: TURBINATE REDUCTION;  Surgeon: Flo Shanks, MD;  Location: [facility] OR;  Service: ENT;  Laterality: Bilateral;   TONSILLECTOMY AND ADENOIDECTOMY Bilateral 06/24/2013   Procedure: TONSILLECTOMY ;  Surgeon: Flo Shanks, MD;  Location: [facility] OR;  Service: ENT;  Laterality: Bilateral;   TUMOR EXCISION     Hx: of right side of neck   WISDOM TOOTH EXTRACTION       OB History   No obstetric history on file.      Home Medications  Prior to Admission medications   Medication Sig Start Date End Date Taking? Authorizing Provider  acetaminophen (TYLENOL) 500 MG tablet Take 1,000 mg by mouth every 6 (six) hours as needed for mild pain or headache.    [provider]  cephALEXin (KEFLEX) 500 MG capsule Take 1 capsule (500 mg total) by mouth 4 (four) times daily. 10/14/18   Burgess Amor, PA-C  escitalopram (LEXAPRO) 20 MG tablet Take 20 mg by mouth daily.  10/01/16   [provider]  fluconazole (DIFLUCAN) 150 MG tablet 1 po  stat; repeat in 3 days 07/13/18   Cresenzo-Dishmon, Scarlette Calico, CNM  HYDROcodone-acetaminophen (NORCO/VICODIN) 5-325 MG tablet Take 1 tablet by mouth every 4 (four) hours as needed. 10/14/18   Burgess Amor, PA-C  ibuprofen (ADVIL,MOTRIN) 600 MG tablet Take 1 tablet (600 mg total) by mouth 3 (three) times daily. 10/14/18   Burgess Amor, PA-C  metroNIDAZOLE (FLAGYL) 500 MG tablet Take 1 tablet (500 mg total) by mouth 2 (two) times daily. 07/22/18   Cresenzo-Dishmon, Scarlette Calico, CNM  norgestimate-ethinyl estradiol (MONONESSA) 0.25-35 MG-MCG tablet Take 1 tablet by mouth daily.    [provider]  nystatin-triamcinolone ointment (MYCOLOG) Apply 1 application topically 2 (two) times daily. 07/13/18   Cresenzo-Dishmon, Scarlette Calico, CNM  ondansetron (ZOFRAN ODT) 4 MG disintegrating tablet Take 1 tablet (4 mg total) by mouth every 8 (eight) hours as needed for nausea or vomiting. 07/10/18   Gerhard Munch, MD  pantoprazole (PROTONIX) 40 MG tablet Take 1 tablet by mouth daily. 01/23/17   [provider]    Family History Family History  Problem Relation Age of Onset   Cancer - Other Mother    Arthritis Mother    Hypertension Father    COPD Father    Cancer - Cervical Other    Diabetes Other    Stroke Other     Social History Social History   Tobacco Use   Smoking status: Former Smoker    Years: 1.00    Types: Cigarettes    Last attempt to quit: 10/22/2016    Years since quitting: 1.9   Smokeless tobacco: Never Used   Tobacco comment: smoked only 1-2 cig weekly for 1 year  Substance Use Topics   Alcohol use: Yes    Alcohol/week: 0.0 standard drinks    Comment: occasional   Drug use: Yes    Frequency: 7.0 times per week    Types: Marijuana    Comment: 3x/week     Allergies   Patient has no known allergies.   Review of Systems Review of Systems  Constitutional: Negative for fever.  HENT: Positive for dental problem and voice change. Negative for congestion, ear  pain, rhinorrhea, sinus pain and sore throat.   Eyes: Negative.  Negative for photophobia and visual disturbance.  Respiratory: Negative for chest tightness and shortness of breath.   Cardiovascular: Negative for chest pain.  Gastrointestinal: Negative for abdominal pain and nausea.  Genitourinary: Negative.   Musculoskeletal: Negative for arthralgias, joint swelling and neck pain.  Skin: Negative.  Negative for rash and wound.  Neurological: Positive for headaches. Negative for dizziness, weakness, light-headedness and numbness.  Psychiatric/Behavioral: Negative.      Physical Exam Updated Vital Signs BP (!) 150/81 (BP Location: Right Arm)    Pulse 77    Temp 98.9 F (37.2 C) (Oral)    Resp 16    Ht 5\' 8"  (1.727 m)    Wt (!) 154.2 kg    LMP 10/11/2018    SpO2  99%    BMI 51.70 kg/m   Physical Exam Vitals signs and nursing note reviewed.  Constitutional:      Appearance: She is well-developed.  HENT:     Head: Normocephalic and atraumatic.     Right Ear: Tympanic membrane and ear canal normal.     Left Ear: Tympanic membrane and ear canal normal.     Nose: Nose normal. No congestion or rhinorrhea.     Mouth/Throat:     Mouth: Mucous membranes are moist.     Dentition: Normal dentition. No dental tenderness, dental caries, dental abscesses or gum lesions.     Tongue: No lesions.     Pharynx: Oropharynx is clear. No pharyngeal swelling, oropharyngeal exudate, posterior oropharyngeal erythema or uvula swelling.     Comments: tender sublingual mass which is subcutaneous, firm and round.  No skin changes.  Sublingual space is soft except for this isolated mass. No induration or erythema.  No purulent dc from [facility] ducts. Eyes:     Extraocular Movements: Extraocular movements intact.     Conjunctiva/sclera: Conjunctivae normal.     Pupils: Pupils are equal, round, and reactive to light.  Neck:     Musculoskeletal: Normal range of motion.     Vascular: No carotid bruit.      Trachea: Trachea normal.  Cardiovascular:     Rate and Rhythm: Normal rate and regular rhythm.     Heart sounds: Normal heart sounds.  Pulmonary:     Effort: Pulmonary effort is normal.     Breath sounds: Normal breath sounds. No wheezing.  Musculoskeletal: Normal range of motion.  Skin:    General: Skin is warm and dry.  Neurological:     General: No focal deficit present.     Mental Status: She is alert.     Cranial Nerves: Cranial nerves are intact.     Motor: Motor function is intact.      ED Treatments / Results  Labs (all labs ordered are listed, but only abnormal results are displayed) Labs Reviewed  CBC WITH DIFFERENTIAL/PLATELET - Abnormal; Notable for the following components:      Result Value   WBC 11.4 (*)    RBC 5.29 (*)    MCV 73.7 (*)    MCH 22.7 (*)    RDW 16.7 (*)    All other components within normal limits  BASIC METABOLIC PANEL - Abnormal; Notable for the following components:   Calcium 8.7 (*)    All other components within normal limits  POC URINE PREG, ED    EKG None  Radiology Ct Soft Tissue Neck W Contrast  Result Date: 10/14/2018 CLINICAL DATA:  Initial evaluation for palpable painful mass under chin for 2 days. EXAM: CT NECK WITH CONTRAST TECHNIQUE: Multidetector CT imaging of the neck was performed using the standard protocol following the bolus administration of intravenous contrast. CONTRAST:  75mL OMNIPAQUE IOHEXOL 300 MG/ML  SOLN COMPARISON:  None. FINDINGS: Pharynx and larynx: Oral cavity within normal limits without discrete mass or loculated fluid collection. No acute inflammatory changes seen about the dentition. Base of tongue within normal limits. Oropharynx and nasopharynx normal. Parapharyngeal fat maintained. No retropharyngeal collection. Epiglottis within normal limits. Vallecula largely effaced by the lingual tonsils. Remainder of the hypopharynx and supraglottic larynx within normal limits. Glottis closed and not well assessed.  Subglottic airway clear. Salivary glands: Salivary glands including the parotid and submandibular glands are within normal limits. Thyroid: Thyroid normal. Lymph nodes: Enlarged right submental level I  a lymph node measures 2.0 x 1.8 cm (series 2, image 50). Mild surrounding hazy fat stranding. Adjacent mildly prominent left level I a node measures 9 mm in short axis. Findings are indeterminate, but could be reactive in nature. This likely corresponds with palpable abnormality of concern. No other pathologically enlarged lymph nodes identified within the neck. Vascular: Normal intravascular enhancement seen throughout the neck. Limited intracranial: Unremarkable. Visualized orbits: Visualized globes and orbital soft tissues within normal limits. Mastoids and visualized paranasal sinuses: Mild mucosal thickening within the ethmoidal air cells. Visualized paranasal sinuses are otherwise clear. Mastoid air cells and middle ear cavities are well pneumatized and free of fluid. Skeleton: No acute osseous finding. No discrete lytic or blastic osseous lesions. Mild cervical spondylolysis present at C5-6 and C6-7. Upper chest: Visualized upper chest demonstrates no acute finding. Partially visualized lungs are clear. Other: Multiple scattered surgical clips noted within the right neck. IMPRESSION: 1. Enlarged 2.0 x 1.8 cm right submental lymph node, presumably corresponding with palpable abnormality of concern. Corresponding with palpable abnormality of concern. Finding is indeterminate, but most likely reactive in nature given history and symptomatology. Clinical follow-up to resolution recommended as possible nodal metastasis or lymphoproliferative disorder could also conceivably have this appearance. 2. No other adenopathy or other acute abnormality identified within the neck. Electronically Signed   By: Rise Mu M.D.   On: 10/14/2018 18:17    Procedures Procedures (including critical care  time)  Medications Ordered in ED Medications  traMADol (ULTRAM) tablet 50 mg (50 mg Oral Given 10/14/18 1722)  iohexol (OMNIPAQUE) 300 MG/ML solution 75 mL (75 mLs Intravenous Contrast Given 10/14/18 1731)     Initial Impression / Assessment and Plan / ED Course  I have reviewed the triage vital signs and the nursing notes.  Pertinent labs & imaging results that were available during my care of the patient were reviewed by me and considered in my medical decision making (see chart for details).       Imaging reviewed and discussed with pt.  Probable reactive/infectious process, but will need ENT eval to rule out cancerous condition.  Keflex, ibuprofen, heat.  Referral to Dr. Jenne Pane.  Also given Dr. Lazarus Salines as she saw him 4 years ago for tonsillectomy.  Return precautions discussed.  Final Clinical Impressions(s) / ED Diagnoses   Final diagnoses:  Lymphadenopathy, submental    ED Discharge Orders         Ordered    cephALEXin (KEFLEX) 500 MG capsule  4 times daily     10/14/18 1825    ibuprofen (ADVIL,MOTRIN) 600 MG tablet  3 times daily     10/14/18 1825    HYDROcodone-acetaminophen (NORCO/VICODIN) 5-325 MG tablet  Every 4 hours PRN     10/14/18 1825           Burgess Amor, PA-C 10/14/18 Ronni Rumble, MD 10/14/18 2035

## 2018-10-14 NOTE — Discharge Instructions (Addendum)
Your swelling is from an enlarged lymph node beneath your chin.  This can be from simple inflammation, but lymph will also swell up when they are fighting infection as discussed.  You are being prescribed an antibiotic and an anti inflammatory (ibuprofen) to treat possible infection and swelling.  You may take the hydrocodone if needed for extra pain relief - but do not drive within 4 hours of taking this medicine.  You will need to see an ENT (ear, nose and throat specialist) for a recheck of this lymph node.  Dr Jenne Pane can see you for this as he is on call for [place] today, unless you want to go back and see your old ENT MD Dr. Lazarus Salines - I have provided both names.  Call for an appointment for a recheck of this condition once the antibiotic finishing. However, get rechecked sooner for any worsened symptoms.  Applying a warm compress for 15 minutes several times daily the healing process.

## 2018-10-14 NOTE — Telephone Encounter (Signed)
Pt called because her Dr is not open today for advice.  She states that for about 2 days she has had a lump under her chin the size of a tennis ball.  She states that in the past she has had a tumor in the area. She feels this is a cyst.  She states she has also had a headache which has caused her to sit in the dark. She feels this lump when she swallows.  She is in no distress but feels the lump could start to affect her breathing.Pt advised to seek treatment either urgent care or ER.  Pt verbalized understanding of all instructions  Reason for Disposition . [1] Swelling is red AND [2] very painful to touch  Answer Assessment - Initial Assessment Questions 1. ONSET: "When did the swelling start?" (e.g., minutes, hours, days)     2 days ago 2. LOCATION: "What part of the face is swollen?"     Under neck 3. SEVERITY: "How swollen is it?"     Severe size of tennis ball 4. ITCHING: "Is there any itching?" If so, ask: "How much?"   (Scale 1-10; mild, moderate or severe)     No 5. PAIN: "Is the swelling painful to touch?" If so, ask: "How painful is it?"   (Scale 1-10; mild, moderate or severe)     8 t touch 6. FEVER: "Do you have a fever?" If so, ask: "What is it, how was it measured, and when did it start?"      no 7. CAUSE: "What do you think is causing the face swelling?"     Cyst under tongue 8. RECURRENT SYMPTOM: "Have you had face swelling before?" If so, ask: "When was the last time?" "What happened that time?"     Yes tumor to arrea 9. OTHER SYMPTOMS: "Do you have any other symptoms?" (e.g., toothache, leg swelling)     No exhausted extreme HA 10. PREGNANCY: "Is there any chance you are pregnant?" "When was your last menstrual period?"       no  Protocols used: [facility]

## 2018-10-25 ENCOUNTER — Encounter: Payer: Self-pay | Admitting: Adult Health

## 2018-11-03 ENCOUNTER — Other Ambulatory Visit: Payer: No Typology Code available for payment source | Admitting: Adult Health

## 2019-01-03 ENCOUNTER — Other Ambulatory Visit: Payer: Self-pay | Admitting: Adult Health

## 2019-02-24 ENCOUNTER — Encounter: Payer: Self-pay | Admitting: Adult Health

## 2019-03-07 ENCOUNTER — Encounter (HOSPITAL_COMMUNITY): Payer: Self-pay | Admitting: Emergency Medicine

## 2019-03-07 ENCOUNTER — Emergency Department (HOSPITAL_COMMUNITY)
Admission: EM | Admit: 2019-03-07 | Discharge: 2019-03-07 | Disposition: A | Discharge status: Home / Self Care | Payer: Self-pay | Attending: Emergency Medicine | Admitting: Emergency Medicine

## 2019-03-07 ENCOUNTER — Emergency Department (HOSPITAL_COMMUNITY): Payer: Self-pay

## 2019-03-07 ENCOUNTER — Other Ambulatory Visit: Payer: Self-pay

## 2019-03-07 DIAGNOSIS — Z87891 Personal history of nicotine dependence: Secondary | ICD-10-CM | POA: Insufficient documentation

## 2019-03-07 DIAGNOSIS — M5442 Lumbago with sciatica, left side: Secondary | ICD-10-CM

## 2019-03-07 DIAGNOSIS — Z79899 Other long term (current) drug therapy: Secondary | ICD-10-CM | POA: Insufficient documentation

## 2019-03-07 DIAGNOSIS — M5441 Lumbago with sciatica, right side: Secondary | ICD-10-CM | POA: Insufficient documentation

## 2019-03-07 LAB — URINALYSIS, ROUTINE W REFLEX MICROSCOPIC
Bilirubin Urine: NEGATIVE
Glucose, UA: NEGATIVE mg/dL
Hgb urine dipstick: NEGATIVE
Ketones, ur: NEGATIVE mg/dL
Leukocytes,Ua: NEGATIVE
Nitrite: NEGATIVE
Protein, ur: 30 mg/dL — AB
Specific Gravity, Urine: 1.034 — ABNORMAL HIGH (ref 1.005–1.030)
pH: 5 (ref 5.0–8.0)

## 2019-03-07 LAB — POC URINE PREG, ED: Preg Test, Ur: NEGATIVE

## 2019-03-07 MED ORDER — METHOCARBAMOL 500 MG PO TABS: 500.0000 mg | ORAL_TABLET | Freq: Three times a day (TID) | ORAL | 0 refills | Status: DC

## 2019-03-07 MED ORDER — HYDROCODONE-ACETAMINOPHEN 5-325 MG PO TABS: ORAL_TABLET | ORAL | 0 refills | Status: DC

## 2019-03-07 NOTE — ED Provider Notes (Signed)
[facility] EMERGENCY DEPARTMENT Provider Note   CSN: [id number] Arrival date & time: 03/07/19  1315     History   Chief Complaint Chief Complaint  Patient presents with  . Back Pain    HPI Jacqueline Alvarado is a 23 y.o. female.     HPI   Jacqueline Alvarado is a 23 y.o. female who presents to the Emergency Department complaining of low back pain for 3 days.  She describes aching pain across her lower back and radiating down into both hips and thighs.  Pain improves when leaning forward and other certain positions, worse with walking and bending.  She has tried OTC pain medications with minimal relief.  She denies known injury, numbness or weakness of the lower extremities, abdominal pain, urine or bowel changes, fever and chills.    Past Medical History:  Diagnosis Date  . Abdominal pain, chronic, right upper quadrant   . Abnormal Pap smear of cervix   . Anemia    iron deficiency  . Anxiety   . BPD (bronchopulmonary dysplasia)   . Depression   . Headache(784.0)   . PONV (postoperative nausea and vomiting)    Hx: of nausea only at age 23    Patient Active Problem List   Diagnosis Date Noted  . Acute cholecystitis 02/01/2017  . Calculus of gallbladder with acute cholecystitis without obstruction   . Biliary colic   . Abdominal pain 01/29/2017  . Gallstones   . Inflammation of the retroperitoneum   . Cicatrix   . Rectal bleeding 12/20/2015  . RUQ pain 12/20/2015  . Hemorrhoids 12/20/2015  . Contraceptive management 09/10/2015  . Trichomonal vaginitis 09/10/2015  . BV (bacterial vaginosis) 09/10/2015  . Hypertrophy of nasal turbinates 06/24/2013  . Tonsillar hypertrophy 06/24/2013  . Obstructive sleep apnea (adult) (pediatric) 06/24/2013  . Morbid obesity (HCC) 06/24/2013    Past Surgical History:  Procedure Laterality Date  . CHOLECYSTECTOMY N/A 02/02/2017   Procedure: LAPAROSCOPIC CHOLECYSTECTOMY;  Surgeon: Franky MachoJenkins, Mark, MD;  Location: [facility] ORS;  Service: General;   Laterality: N/A;  . COLONOSCOPY N/A 01/07/2016   Dr. Darrick Pennafields: Nonbleeding internal hemorrhoids, large. 3 bands successfully placed. Distal ileum containing multiple ulcers which she felt was related to NSAIDs. Biopsies nonspecific.  Marland Kitchen. HEMORRHOID BANDING N/A 01/07/2016   Procedure: HEMORRHOID BANDING;  Surgeon: West BaliSandi L Fields, MD;  Location: [facility] END[facility] SU[facility];  Service: Endoscopy;  Laterality: N/A;  . MASS EXCISION N/A 11/26/2016   Procedure: EXCISION OF ABDOMINAL CICATRIX 4CM;  Surgeon: Franky MachoJenkins, Mark, MD;  Location: [facility] ORS;  Service: General;  Laterality: N/A;  p knows to arrive at 7:50  . NASAL SEPTOPLASTY W/ TURBINOPLASTY Bilateral 06/24/2013   Procedure: TURBINATE REDUCTION;  Surgeon: Flo ShanksKarol Wolicki, MD;  [facility];  Service: ENT;  Laterality: Bilateral;  . TONSILLECTOMY AND ADENOIDECTOMY Bilateral 06/24/2013   Procedure: TONSILLECTOMY ;  Surgeon: Flo ShanksKarol Wolicki, MD;  [facility];  Service: ENT;  Laterality: Bilateral;  . TUMOR EXCISION     Hx: of right side of neck  . WISDOM TOOTH EXTRACTION       OB History   No obstetric history on file.      Home Medications    Prior to Admission medications   Medication Sig Start Date End Date Taking? Authorizing Provider  acetaminophen (TYLENOL) 500 MG tablet Take 1,000 mg by mouth every 6 (six) hours as needed for mild pain or headache.    [provider]  cephALEXin (KEFLEX) 500 MG capsule Take 1 capsule (  500 mg total) by mouth 4 (four) times daily. 10/14/18   Evalee Jefferson, PA-C  escitalopram (LEXAPRO) 20 MG tablet Take 20 mg by mouth daily.  10/01/16   [provider]  fluconazole (DIFLUCAN) 150 MG tablet 1 po stat; repeat in 3 days 07/13/18   Cresenzo-Dishmon, Joaquim Lai, CNM  HYDROcodone-acetaminophen (NORCO/VICODIN) 5-325 MG tablet Take one tab po q 4 hrs prn pain 03/07/19   Kaamil Morefield, PA-C  ibuprofen (ADVIL,MOTRIN) 600 MG tablet Take 1 tablet (600 mg total) by mouth 3 (three) times daily. 10/14/18   Evalee Jefferson, PA-C   methocarbamol (ROBAXIN) 500 MG tablet Take 1 tablet (500 mg total) by mouth 3 (three) times daily. 03/07/19   Sugar Vanzandt, PA-C  metroNIDAZOLE (FLAGYL) 500 MG tablet Take 1 tablet (500 mg total) by mouth 2 (two) times daily. 07/22/18   Cresenzo-Dishmon, Joaquim Lai, CNM  norgestimate-ethinyl estradiol (MONONESSA) 0.25-35 MG-MCG tablet Take 1 tablet by mouth daily.    [provider]  nystatin-triamcinolone ointment (MYCOLOG) Apply 1 application topically 2 (two) times daily. 07/13/18   Cresenzo-Dishmon, Joaquim Lai, CNM  ondansetron (ZOFRAN ODT) 4 MG disintegrating tablet Take 1 tablet (4 mg total) by mouth every 8 (eight) hours as needed for nausea or vomiting. 07/10/18   Carmin Muskrat, MD  pantoprazole (PROTONIX) 40 MG tablet Take 1 tablet by mouth daily. 01/23/17   [provider]    Family History Family History  Problem Relation Age of Onset  . Cancer - Other Mother   . Arthritis Mother   . Hypertension Father   . COPD Father   . Cancer - Cervical Other   . Diabetes Other   . Stroke Other     Social History Social History   Tobacco Use  . Smoking status: Former Smoker    Years: 1.00    Types: Cigarettes    Quit date: 10/22/2016    Years since quitting: 2.3  . Smokeless tobacco: Never Used  . Tobacco comment: smoked only 1-2 cig weekly for 1 year  Substance Use Topics  . Alcohol use: Yes    Alcohol/week: 0.0 standard drinks    Comment: occasional  . Drug use: Yes    Frequency: 7.0 times per week    Types: Marijuana    Comment: 3x/week     Allergies   Patient has no known allergies.   Review of Systems Review of Systems  Constitutional: Negative for fever.  Respiratory: Negative for shortness of breath.   Gastrointestinal: Negative for abdominal pain, constipation and vomiting.  Genitourinary: Negative for decreased urine volume, difficulty urinating, dysuria, flank pain and hematuria.  Musculoskeletal: Positive for back pain. Negative for joint  swelling.  Skin: Negative for rash.  Neurological: Negative for weakness and numbness.     Physical Exam Updated Vital Signs BP (!) 162/93 (BP Location: Right Arm)   Pulse 79   Temp 98 F (36.7 C) (Oral)   Resp 18   Ht 5\' 8"  (1.727 m)   Wt (!) 161 kg   LMP 02/28/2019   SpO2 100%   BMI 53.98 kg/m   Physical Exam Vitals signs and nursing note reviewed.  Constitutional:      General: She is not in acute distress.    Appearance: She is well-developed. She is obese. She is not ill-appearing or toxic-appearing.  HENT:     Head: Atraumatic.  Neck:     Musculoskeletal: Normal range of motion and neck supple.  Cardiovascular:     Rate and Rhythm: Normal rate and regular  rhythm.     Pulses: Normal pulses.     Comments: DP pulses are strong and palpable bilaterally Pulmonary:     Effort: Pulmonary effort is normal. No respiratory distress.     Breath sounds: Normal breath sounds.  Abdominal:     General: There is no distension.     Palpations: Abdomen is soft.     Tenderness: There is no abdominal tenderness.  Musculoskeletal:        General: Tenderness present. No swelling or signs of injury.     Lumbar back: She exhibits tenderness and pain. She exhibits normal range of motion, no swelling, no deformity, no laceration and normal pulse.     Right lower leg: No edema.     Left lower leg: No edema.     Comments: ttp of the lower lumbar spine and bilateral paraspinal muscles. Neg SLR bilaterally.   Pt has 5/5 strength against resistance of bilateral lower extremities.  Hip flexors and extensors intact   Skin:    General: Skin is warm.     Capillary Refill: Capillary refill takes less than 2 seconds.     Findings: No rash.  Neurological:     General: No focal deficit present.     Mental Status: She is alert.     Sensory: No sensory deficit.     Motor: No weakness or abnormal muscle tone.     Gait: Gait normal.     Deep Tendon Reflexes:     Reflex Scores:      Patellar  reflexes are 2+ on the right side and 2+ on the left side.      Achilles reflexes are 2+ on the right side and 2+ on the left side.     ED Treatments / Results  Labs (all labs ordered are listed, but only abnormal results are displayed) Labs Reviewed  URINALYSIS, ROUTINE W REFLEX MICROSCOPIC - Abnormal; Notable for the following components:      Result Value   Color, Urine AMBER (*)    APPearance HAZY (*)    Specific Gravity, Urine 1.034 (*)    Protein, ur 30 (*)    Bacteria, UA RARE (*)    All other components within normal limits  URINE CULTURE  POC URINE PREG, ED    EKG None  Radiology Dg Lumbar Spine Complete  Result Date: 03/07/2019 CLINICAL DATA:  Lumbago EXAM: LUMBAR SPINE - COMPLETE 4+ VIEW COMPARISON:  None. FINDINGS: Frontal, lateral, spot lumbosacral lateral, and bilateral oblique views were obtained. There are 5 non-rib-bearing lumbar type vertebral bodies. There is no fracture or spondylolisthesis. There is moderate disc space narrowing at L5-S1. Other disc spaces appear unremarkable. There is no appreciable facet arthropathy. IMPRESSION: Moderate disc space narrowing at L5-S1. Other disc spaces appear unremarkable. No appreciable facet arthropathy. No fracture or spondylolisthesis. Electronically Signed   By: Bretta BangWilliam  Woodruff III M.D.   On: 03/07/2019 14:59    Procedures Procedures (including critical care time)  Medications Ordered in ED Medications - No data to display   Initial Impression / Assessment and Plan / ED Course  I have reviewed the triage vital signs and the nursing notes.  Pertinent labs & imaging results that were available during my care of the patient were reviewed by me and considered in my medical decision making (see chart for details).        Pt with low back pain.  She is well appearing and non-toxic.  NV intact.  Ambulatory with a steady  gait.  No focal neuro deficits.  Doubt emergent neurological process.  Pt agrees to tx plan and  close out pt f/u.  Return precautions discussed.   Final Clinical Impressions(s) / ED Diagnoses   Final diagnoses:  Acute bilateral low back pain with bilateral sciatica    ED Discharge Orders         Ordered    HYDROcodone-acetaminophen (NORCO/VICODIN) 5-325 MG tablet     03/07/19 1525    methocarbamol (ROBAXIN) 500 MG tablet  3 times daily     03/07/19 1525           Pauline Ausriplett, Dellar Traber, PA-C 03/09/19 1347    Bethann BerkshireZammit, Joseph, MD 03/20/19 [phone]

## 2019-03-07 NOTE — Discharge Instructions (Signed)
Alternate ice and heat to your lower back.  You may continue taking ibuprofen 600 mg 3 times a day with food.  Follow-up with your primary doctor for recheck.  Be sure to keep your appointment with your OB/GYN in November.  Return to the ER for any worsening symptoms such as increasing pain, fever, or chills.

## 2019-03-07 NOTE — ED Triage Notes (Signed)
Pt reports lower back pain x 3 days with no injury. Pain goes down hips and legs.

## 2019-03-09 LAB — URINE CULTURE

## 2019-03-22 ENCOUNTER — Other Ambulatory Visit: Payer: Self-pay | Admitting: Adult Health

## 2019-05-24 ENCOUNTER — Ambulatory Visit (INDEPENDENT_AMBULATORY_CARE_PROVIDER_SITE_OTHER): Payer: 59 | Admitting: Adult Health

## 2019-05-24 ENCOUNTER — Other Ambulatory Visit: Payer: Self-pay

## 2019-05-24 ENCOUNTER — Encounter: Payer: Self-pay | Admitting: Adult Health

## 2019-05-24 ENCOUNTER — Other Ambulatory Visit (HOSPITAL_COMMUNITY)
Admission: RE | Admit: 2019-05-24 | Discharge: 2019-05-24 | Disposition: A | Discharge status: Home / Self Care | Payer: 59 | Source: Ambulatory Visit | Attending: Adult Health | Admitting: Adult Health

## 2019-05-24 VITALS — BP 140/89 | HR 98 | Ht 68.0 in | Wt 350.8 lb

## 2019-05-24 DIAGNOSIS — R030 Elevated blood-pressure reading, without diagnosis of hypertension: Secondary | ICD-10-CM | POA: Insufficient documentation

## 2019-05-24 DIAGNOSIS — Z01419 Encounter for gynecological examination (general) (routine) without abnormal findings: Secondary | ICD-10-CM

## 2019-05-24 DIAGNOSIS — Z30011 Encounter for initial prescription of contraceptive pills: Secondary | ICD-10-CM

## 2019-05-24 DIAGNOSIS — N921 Excessive and frequent menstruation with irregular cycle: Secondary | ICD-10-CM | POA: Insufficient documentation

## 2019-05-24 DIAGNOSIS — Z3202 Encounter for pregnancy test, result negative: Secondary | ICD-10-CM | POA: Diagnosis not present

## 2019-05-24 DIAGNOSIS — Z113 Encounter for screening for infections with a predominantly sexual mode of transmission: Secondary | ICD-10-CM

## 2019-05-24 LAB — POCT URINE PREGNANCY: Preg Test, Ur: NEGATIVE

## 2019-05-24 MED ORDER — TAYTULLA 1-20 MG-MCG(24) PO CAPS: 1.0000 | ORAL_CAPSULE | Freq: Every day | ORAL | 3 refills | Status: DC

## 2019-05-24 MED ORDER — AMLODIPINE BESYLATE 5 MG PO TABS: 5.0000 mg | ORAL_TABLET | Freq: Every day | ORAL | 6 refills | Status: DC

## 2019-05-24 NOTE — Progress Notes (Addendum)
Patient ID: Jacqueline Alvarado, female   DOB: [birth year], 23 y.o.   MRN: [id number] History of Present Illness: Jacqueline Alvarado is a 23 year old black female, single, G0P0, in for a well woman gyn exam and pap, and she wants to discuss birth control options, and has heavy periods, changes every 3 hours.Has had 2 periods in a month and they may come early. PCP is Dr Karie Kirks.   Current Medications, Allergies, Past Medical History, Past Surgical History, Family History and Social History were reviewed in [employer] record.     Review of Systems:  Patient denies any headaches, hearing loss, fatigue, blurred vision, shortness of breath, chest pain, abdominal pain, problems with bowel movements, urination, or intercourse. No joint pain or mood swings. See HPI for positives.   Physical Exam:BP 140/89 (BP Location: Right Arm, Patient Position: Sitting, Cuff Size: Large)   Pulse 98   Ht 5\' 8"  (1.727 m)   Wt (!) 350 lb 12.8 oz (159.1 kg)   LMP 05/17/2019   BMI 53.34 kg/m UPT negative. General:  Well developed, well nourished, no acute distress Skin:  Warm and dry Neck:  Midline trachea, normal thyroid, good ROM, no lymphadenopathy Lungs; Clear to auscultation bilaterally Breast:  No dominant palpable mass, retraction, or nipple discharge Cardiovascular: Regular rate and rhythm Abdomen:  Soft, non tender, no hepatosplenomegaly Pelvic:  External genitalia is normal in appearance, no lesions.  The vagina is normal in appearance,+blood in vault.Marland Kitchen Urethra has no lesions or masses. The cervix is smooth, pap with GC/CHL and high risk HPV 16/18 genotyping performed.  Uterus is felt to be normal size, shape, and contour.  No adnexal masses or tenderness noted.Bladder is non tender, no masses felt. Extremities/musculoskeletal:  No swelling or varicosities noted, no clubbing or cyanosis Psych:  No mood changes, alert and cooperative,seems happy Fall risk is low PHQ 9 score is 17, denies being  suicidal, and did go to [facility] for counseling, but was too expensive, so stopped, declines meds  Examination chaperoned by Jacqueline Hotter RN. Discussed low dose OCs, will try taytulla.  Impression and Plan: 1. Encounter for gynecological examination with Papanicolaou smear of cervix Pap sent Physical in 1 year Pap in 3 if normal  2. Pregnancy examination or test, negative result  3. Menorrhagia with irregular cycle Will try Taytulla   4. Encounter for initial prescription of contraceptive pills Will start Taytulla, 1 pack given, start today, use condoms Meds ordered this encounter  Medications  . Norethin Ace-Eth Estrad-FE (TAYTULLA) 1-20 MG-MCG(24) CAPS    Sig: Take 1 tablet by mouth daily.    Dispense:  84 capsule    Refill:  3    Order Specific Question:   Supervising Provider    Answer:   Elonda Husky, LUTHER H [[id number]]  . amLODipine (NORVASC) 5 MG tablet    Sig: Take 1 tablet (5 mg total) by mouth daily.    Dispense:  30 tablet    Refill:  6    Order Specific Question:   Supervising Provider    Answer:   Elonda Husky, LUTHER H [[id number]]    5. Elevated BP without diagnosis of hypertension Will start on norvasc and try DASH diet Follow up in 6 weeks for BP check and ROS   6. Morbid obesity ([place]) Try to walk more and eat less   7. Screening examination for STD (sexually transmitted disease) Check HIV at her request

## 2019-05-24 NOTE — Patient Instructions (Signed)

## 2019-05-25 LAB — HIV ANTIBODY (ROUTINE TESTING W REFLEX): HIV Screen 4th Generation wRfx: NONREACTIVE

## 2019-05-27 LAB — CYTOLOGY - PAP
Adequacy: ABSENT
Chlamydia: NEGATIVE
Comment: NEGATIVE
Comment: NEGATIVE
Comment: NEGATIVE
Comment: NEGATIVE
Comment: NORMAL
Diagnosis: NEGATIVE
HPV 16: NEGATIVE
HPV 18 / 45: NEGATIVE
High risk HPV: POSITIVE — AB
Neisseria Gonorrhea: NEGATIVE

## 2019-05-31 ENCOUNTER — Encounter: Payer: Self-pay | Admitting: Adult Health

## 2019-05-31 DIAGNOSIS — R87810 Cervical high risk human papillomavirus (HPV) DNA test positive: Secondary | ICD-10-CM

## 2019-05-31 HISTORY — DX: Cervical high risk human papillomavirus (HPV) DNA test positive: R87.810

## 2019-06-13 ENCOUNTER — Telehealth: Payer: Self-pay | Admitting: *Deleted

## 2019-06-13 NOTE — Telephone Encounter (Signed)
Pt states her pharmacy don't have her Taytulla on file. I called [facility] [address] and gave verbal for pt's Taytulla. [place]

## 2019-07-18 ENCOUNTER — Ambulatory Visit: Payer: 59 | Admitting: Adult Health

## 2019-09-12 ENCOUNTER — Telehealth: Payer: Self-pay | Admitting: *Deleted

## 2019-09-12 NOTE — Telephone Encounter (Signed)
Patient left message that she would like samples of BCP for 2 months. Patient does not have insurance and the cost is $300 a month.

## 2019-09-12 NOTE — Telephone Encounter (Signed)
Telephoned patient at home number and left message no samples were available. If any questions could call back.

## 2019-09-13 ENCOUNTER — Telehealth: Payer: Self-pay | Admitting: *Deleted

## 2019-09-13 MED ORDER — NORETHIN ACE-ETH ESTRAD-FE 1-20 MG-MCG PO TABS: 1.0000 | ORAL_TABLET | Freq: Every day | ORAL | 11 refills | Status: DC

## 2019-09-13 NOTE — Telephone Encounter (Signed)
Will rx junel 1-20 °

## 2019-09-13 NOTE — Telephone Encounter (Signed)
Pt called requesting Taytuella samples. No samples available. Can you send prescription to her pharmacy? Thanks!! JSY

## 2019-09-14 NOTE — Telephone Encounter (Signed)
Pt aware birth control was sent to pharmacy. JSY 

## 2019-10-20 ENCOUNTER — Other Ambulatory Visit: Payer: Self-pay

## 2019-10-20 ENCOUNTER — Encounter: Payer: Self-pay | Admitting: Obstetrics and Gynecology

## 2019-10-20 ENCOUNTER — Ambulatory Visit (INDEPENDENT_AMBULATORY_CARE_PROVIDER_SITE_OTHER): Payer: Self-pay | Admitting: Obstetrics and Gynecology

## 2019-10-20 VITALS — BP 161/85 | HR 67 | Ht 68.0 in | Wt 353.4 lb

## 2019-10-20 DIAGNOSIS — L723 Sebaceous cyst: Secondary | ICD-10-CM

## 2019-10-20 DIAGNOSIS — L089 Local infection of the skin and subcutaneous tissue, unspecified: Secondary | ICD-10-CM

## 2019-10-20 NOTE — Progress Notes (Signed)
PATIENT ID: Jacqueline Alvarado, female     DOB: October 22, 1995, 24 y.o.     MRN: [id number]    [facility] Clinic Visit  @DATE @            Patient name: Jacqueline Alvarado MRN Jacqueline Alvarado  Date of birth: 1995/08/02  CC & HPI:  Jacqueline Alvarado is an  24 y.o. female presenting today for a cyst on her labia.   ROS:  Review of Systems  Constitutional: Negative for diaphoresis, fever, malaise/fatigue and weight loss.  HENT: Negative for congestion and sore throat.   Eyes: Negative for blurred vision and double vision.  Respiratory: Negative for cough and shortness of breath.   Cardiovascular: Negative for chest pain, palpitations and leg swelling.  Gastrointestinal: Negative for constipation, diarrhea, nausea and vomiting.  Genitourinary: Negative for frequency and urgency.  Musculoskeletal: Negative for back pain, falls and myalgias.  Skin: Negative for rash.  Neurological: Negative for dizziness, weakness and headaches.  Psychiatric/Behavioral: Negative for depression. The patient is not nervous/anxious.      Pertinent History Reviewed:   Reviewed: Significant for Body mass index is 53.73 kg/m.  Medical         Past Medical History:  Diagnosis Date  . Abdominal pain, chronic, right upper quadrant   . Abnormal Pap smear of cervix   . Anemia    iron deficiency  . Anxiety   . BPD (bronchopulmonary dysplasia)   . Depression   . Headache(784.0)   . Papanicolaou smear of cervix with positive high risk human papilloma virus (HPV) test 05/31/2019   05/2019 repeat HPV in 1 year   . PONV (postoperative nausea and vomiting)    Hx: of nausea only at age 1                              Surgical Hx:    Past Surgical History:  Procedure Laterality Date  . CHOLECYSTECTOMY N/A 02/02/2017   Procedure: LAPAROSCOPIC CHOLECYSTECTOMY;  Surgeon: 02/04/2017, MD;  Location: [facility];  Service: General;  Laterality: N/A;  . COLONOSCOPY N/A 01/07/2016   Dr. 01/09/2016: Nonbleeding internal hemorrhoids, large. 3 bands  successfully placed. Distal ileum containing multiple ulcers which she Alvarado was related to NSAIDs. Biopsies nonspecific.  Darrick Penna HEMORRHOID BANDING N/A 01/07/2016   Procedure: HEMORRHOID BANDING;  Surgeon: 01/09/2016, MD;  Location: [facility];  Service: Endoscopy;  Laterality: N/A;  . MASS EXCISION N/A 11/26/2016   Procedure: EXCISION OF ABDOMINAL CICATRIX 4CM;  Surgeon: 11/28/2016, MD;  Location: [facility];  Service: General;  Laterality: N/A;  p knows to arrive at 7:50  . NASAL SEPTOPLASTY W/ TURBINOPLASTY Bilateral 06/24/2013   Procedure: TURBINATE REDUCTION;  Surgeon: 14/06/2013, MD;  Location: [facility] OR;  Service: ENT;  Laterality: Bilateral;  . TONSILLECTOMY AND ADENOIDECTOMY Bilateral 06/24/2013   Procedure: TONSILLECTOMY ;  Surgeon: 14/06/2013, MD;  Location: [facility] OR;  Service: ENT;  Laterality: Bilateral;  . TUMOR EXCISION     Hx: of right side of neck  . WISDOM TOOTH EXTRACTION     Medications: Reviewed & Updated - see associated section                       Current Outpatient Medications:  .  acetaminophen (TYLENOL) 500 MG tablet, Take 1,000 mg by mouth every 6 (six) hours as needed for mild pain or headache., Disp: , Rfl:  .  citalopram (CELEXA) 20 MG tablet, Take 20 mg by mouth daily., Disp: , Rfl:  .  norethindrone-ethinyl estradiol (LOESTRIN FE) 1-20 MG-MCG tablet, Take 1 tablet by mouth daily., Disp: 1 Package, Rfl: 11   Social History: Reviewed -  reports that she quit smoking about 2 years ago. Her smoking use included cigarettes. She quit after 1.00 year of use. She has never used smokeless tobacco.  Objective Findings:  Vitals: Blood pressure (!) 161/85, pulse 67, height 5\' 8"  (1.727 m), weight (!) 353 lb 6.4 oz (160.3 kg), last menstrual period 10/12/2019.  PHYSICAL EXAMINATION General appearance - alert, well appearing, and in no distress, oriented to person, place, and time, overweight and well hydrated Mental status - alert, oriented to person, place, and time,  normal mood, behavior, speech, dress, motor activity, and thought processes, affect appropriate to mood Chest - not examined Heart - not examined Abdomen - not examined Breasts - not examined Skin - normal coloration and turgor, no rashes, no suspicious skin lesions noted   PELVIC External genitalia - normal, difficult exam due to bmi Vulva - normal Vagina - 2 cm x 1 cm firm hard round sebaceous cyst at the lower end of right labia minora. Tender to contact, some inflammation suspected. Cervix - normal  Uterus - not examined  Adnexa - not examined [place] -  Rectal - rectal exam not indicated    Assessment & Plan:   A:  1.  vulvar sebaceous cyst 2. Suspect inflamed at present  P:  1. Rx Keflex 500 qid x 7 d 2. Return in 2 wk 3. Resume blood pressure medicine monitor bp's at home   By signing my name below, I, [employer], attest that this documentation has been prepared under the direction and in the presence of Jonnie Kind, MD. Electronically Signed: [place]. 10/20/19. 3:51 PM.

## 2019-10-21 ENCOUNTER — Other Ambulatory Visit: Payer: Self-pay | Admitting: Obstetrics and Gynecology

## 2019-10-21 ENCOUNTER — Telehealth: Payer: Self-pay | Admitting: Obstetrics and Gynecology

## 2019-10-21 ENCOUNTER — Telehealth: Payer: Self-pay | Admitting: *Deleted

## 2019-10-21 MED ORDER — CEPHALEXIN 500 MG PO CAPS: 500.0000 mg | ORAL_CAPSULE | Freq: Four times a day (QID) | ORAL | 1 refills | Status: DC

## 2019-10-21 NOTE — Telephone Encounter (Signed)
Called [facility] in [place] and left on voicemail Keflex 500 qid x 7 days # 28 per Dr. Emelda Fear. JSY

## 2019-10-21 NOTE — Telephone Encounter (Signed)
Pts mom called and states the medication that was to be sent in yesterday is not at [facility] and she would like to see if the medicine can be sent in.

## 2019-10-21 NOTE — Progress Notes (Signed)
Keflex for folliculitis sent to pharmacy

## 2019-11-02 ENCOUNTER — Ambulatory Visit (INDEPENDENT_AMBULATORY_CARE_PROVIDER_SITE_OTHER): Payer: Self-pay | Admitting: Obstetrics and Gynecology

## 2019-11-02 ENCOUNTER — Encounter: Payer: Self-pay | Admitting: Obstetrics and Gynecology

## 2019-11-02 ENCOUNTER — Other Ambulatory Visit: Payer: Self-pay

## 2019-11-02 VITALS — BP 155/84 | HR 58 | Ht 68.0 in | Wt 349.0 lb

## 2019-11-02 DIAGNOSIS — L739 Follicular disorder, unspecified: Secondary | ICD-10-CM

## 2019-11-02 DIAGNOSIS — L089 Local infection of the skin and subcutaneous tissue, unspecified: Secondary | ICD-10-CM

## 2019-11-02 DIAGNOSIS — R030 Elevated blood-pressure reading, without diagnosis of hypertension: Secondary | ICD-10-CM

## 2019-11-02 DIAGNOSIS — L723 Sebaceous cyst: Secondary | ICD-10-CM

## 2019-11-02 MED ORDER — CEPHALEXIN 500 MG PO CAPS: 500.0000 mg | ORAL_CAPSULE | Freq: Four times a day (QID) | ORAL | 1 refills | Status: DC

## 2019-11-02 MED ORDER — LISINOPRIL-HYDROCHLOROTHIAZIDE 20-12.5 MG PO TABS: 1.0000 | ORAL_TABLET | Freq: Every day | ORAL | 5 refills | Status: DC

## 2019-11-02 NOTE — Progress Notes (Addendum)
Patient ID: Jacqueline Alvarado, female   DOB: [birth year], 24 y.o.   MRN: [id number]    [facility] Visit  11/02/19           Patient name: Jacqueline Alvarado MRN [id number]  Date of birth: 02-09-1996  CC & HPI:  Jacqueline Alvarado is a 24 y.o. female presenting today for re-examination of right vulvar sebaceous cyst. Pt tearful over the 'bad day " she has had.  She completed 7 days of Keflex 500 mg QID.  She notes that most of her tenderness has disappeared, but she did develop a yeast infection from the antibiotics. She self treated for the yeast infection.   At this time, she does not have medical insurance. She was on Norvasc, but it was $40 a month, which was too expensive for her.   ROS:  Review of Systems  Constitutional: Negative for diaphoresis, fever, malaise/fatigue and weight loss.  HENT: Negative for congestion and sore throat.   Eyes: Negative for blurred vision and double vision.  Respiratory: Negative for cough and shortness of breath.   Cardiovascular: Negative for chest pain, palpitations and leg swelling.  Gastrointestinal: Negative for constipation, diarrhea, nausea and vomiting.  Genitourinary: Negative for frequency and urgency.  Musculoskeletal: Negative for back pain, falls and myalgias.  Skin: Negative for rash.  Neurological: Negative for dizziness, weakness and headaches.  Psychiatric/Behavioral: Negative for depression. The patient is not nervous/anxious.      Pertinent History Reviewed:  Medical         Past Medical History:  Diagnosis Date  . Abdominal pain, chronic, right upper quadrant   . Abnormal Pap smear of cervix   . Anemia    iron deficiency  . Anxiety   . BPD (bronchopulmonary dysplasia)   . Depression   . Headache(784.0)   . Papanicolaou smear of cervix with positive high risk human papilloma virus (HPV) test 05/31/2019   05/2019 repeat HPV in 1 year   . PONV (postoperative nausea and vomiting)    Hx: of nausea only at age 57                               Surgical Hx:    Past Surgical History:  Procedure Laterality Date  . CHOLECYSTECTOMY N/A 02/02/2017   Procedure: LAPAROSCOPIC CHOLECYSTECTOMY;  Surgeon: Franky Macho, MD;  Location: [facility];  Service: General;  Laterality: N/A;  . COLONOSCOPY N/A 01/07/2016   Dr. Darrick Penna: Nonbleeding internal hemorrhoids, large. 3 bands successfully placed. Distal ileum containing multiple ulcers which she felt was related to NSAIDs. Biopsies nonspecific.  Marland Kitchen HEMORRHOID BANDING N/A 01/07/2016   Procedure: HEMORRHOID BANDING;  Surgeon: West Bali, MD;  Location: [facility];  Service: Endoscopy;  Laterality: N/A;  . MASS EXCISION N/A 11/26/2016   Procedure: EXCISION OF ABDOMINAL CICATRIX 4CM;  Surgeon: Franky Macho, MD;  Location: [facility];  Service: General;  Laterality: N/A;  p knows to arrive at 7:50  . NASAL SEPTOPLASTY W/ TURBINOPLASTY Bilateral 06/24/2013   Procedure: TURBINATE REDUCTION;  Surgeon: Flo Shanks, MD;  Location: [facility] OR;  Service: ENT;  Laterality: Bilateral;  . TONSILLECTOMY AND ADENOIDECTOMY Bilateral 06/24/2013   Procedure: TONSILLECTOMY ;  Surgeon: Flo Shanks, MD;  Location: [facility] OR;  Service: ENT;  Laterality: Bilateral;  . TUMOR EXCISION     Hx: of right side of neck  . WISDOM TOOTH EXTRACTION     Medications: Reviewed & Updated -  see associated section                       Current Outpatient Medications:  .  acetaminophen (TYLENOL) 500 MG tablet, Take 1,000 mg by mouth every 6 (six) hours as needed for mild pain or headache., Disp: , Rfl:  .  cephALEXin (KEFLEX) 500 MG capsule, Take 1 capsule (500 mg total) by mouth 4 (four) times daily., Disp: 28 capsule, Rfl: 1 .  citalopram (CELEXA) 20 MG tablet, Take 20 mg by mouth daily., Disp: , Rfl:  .  norethindrone-ethinyl estradiol (LOESTRIN FE) 1-20 MG-MCG tablet, Take 1 tablet by mouth daily., Disp: 1 Package, Rfl: 11   Social History: Reviewed -  reports that she quit smoking about 3 years ago. Her smoking use  included cigarettes. She quit after 1.00 year of use. She has never used smokeless tobacco.  Objective Findings:  Vitals: Last menstrual period 10/12/2019. BP (!) 155/84 (BP Location: Left Wrist, Patient Position: Sitting, Cuff Size: Normal)   Pulse (!) 58   Ht 5\' 8"  (1.727 m)   Wt (!) 349 lb (158.3 kg)   LMP 11/02/2019 (Exact Date)   BMI 53.07 kg/m   PHYSICAL EXAMINATION General appearance - alert, well appearing. anxious, oriented to person, place, and time, overweight and well hydrated Mental status - alert, oriented to person, place, and time, normal mood, behavior, speech, dress, motor activity, and thought processes, affect appropriate to mood Chest - not examined Heart - not examined Abdomen - not examined Breasts - not examined Skin - normal coloration and turgor, no rashes, no suspicious skin lesions noted  PELVIC External genitalia - normal female, slight moisture changes advise Monistat otc Vulva - sebaceous cyst x 2. One at right l. Minora just to right of posterior fourchette, one at right l majora, third at mons pubis all approx 1.5 cm maximum Vagina - normal Cervix -   Uterus -   Adnexa -  Wet Mount -  Rectal - rectal exam not indicated    Assessment & Plan:   A:  1. Concerns for blood pressure elevation without treatment due to lack of health insurance  P:  1. Rx Keflex 500 mg QID x7 days to keep on hand for inflammation flair up.  2. Rx Lisinopril /HCTZ 10/25   By signing my name below, I, [employer], attest that this documentation has been prepared under the direction and in the presence of Jonnie Kind, MD. Electronically Signed: [place]. 11/02/19. 4:22 PM.  I personally performed the services described in this documentation, which was SCRIBED in my presence. The recorded information has been reviewed and considered accurate. It has been edited as necessary during review. Jonnie Kind, MD

## 2019-11-02 NOTE — Progress Notes (Signed)
[place] [facility] Visit  11/02/19           Patient name: Jacqueline Alvarado MRN [id number]  Date of birth: [birth year]  CC & HPI:  Jacqueline Alvarado is a 24 y.o. female presenting today for re-examination of right vulvar sebaceous cyst. Pt tearful over the 'bad day " she has had.  She completed 7 days of Keflex 500 mg QID.  She notes that most of her tenderness has disappeared, but she did develop a yeast infection from the antibiotics. She self treated for the yeast infection, OTC creams   At this time, she does not have medical insurance. She was on Norvasc, but it was $40 a month, which was too expensive for her.  Good Rx used to find Lisinopril/hctz.  ROS:  Review of Systems  Constitutional: Negative for diaphoresis, fever, malaise/fatigue and weight loss.  HENT: Negative for congestion and sore throat.   Eyes: Negative for blurred vision and double vision.  Respiratory: Negative for cough and shortness of breath.   Cardiovascular: Negative for chest pain, palpitations and leg swelling.  Gastrointestinal: Negative for constipation, diarrhea, nausea and vomiting.  Genitourinary: Negative for frequency and urgency.  Musculoskeletal: Negative for back pain, falls and myalgias.  Skin: Negative for rash.  Neurological: Negative for dizziness, weakness and headaches.  Psychiatric/Behavioral: Negative for depression. The patient is not nervous/anxious.      Pertinent History Reviewed:  Medical         Past Medical History:  Diagnosis Date  . Abdominal pain, chronic, right upper quadrant   . Abnormal Pap smear of cervix   . Anemia    iron deficiency  . Anxiety   . BPD (bronchopulmonary dysplasia)   . Depression   . Headache(784.0)   . Papanicolaou smear of cervix with positive high risk human papilloma virus (HPV) test 05/31/2019   05/2019 repeat HPV in 1 year   . PONV (postoperative nausea and vomiting)    Hx: of nausea only at age 70                              Surgical Hx:     Past Surgical History:  Procedure Laterality Date  . CHOLECYSTECTOMY N/A 02/02/2017   Procedure: LAPAROSCOPIC CHOLECYSTECTOMY;  Surgeon: Aviva Signs, MD;  Location: [facility];  Service: General;  Laterality: N/A;  . COLONOSCOPY N/A 01/07/2016   Dr. Oneida Alar: Nonbleeding internal hemorrhoids, large. 3 bands successfully placed. Distal ileum containing multiple ulcers which she felt was related to NSAIDs. Biopsies nonspecific.  Marland Kitchen HEMORRHOID BANDING N/A 01/07/2016   Procedure: HEMORRHOID BANDING;  Surgeon: Danie Binder, MD;  Location: [facility];  Service: Endoscopy;  Laterality: N/A;  . MASS EXCISION N/A 11/26/2016   Procedure: EXCISION OF ABDOMINAL CICATRIX 4CM;  Surgeon: Aviva Signs, MD;  Location: [facility];  Service: General;  Laterality: N/A;  p knows to arrive at 7:50  . NASAL SEPTOPLASTY W/ TURBINOPLASTY Bilateral 06/24/2013   Procedure: TURBINATE REDUCTION;  Surgeon: Jodi Marble, MD;  Location: [place];  Service: ENT;  Laterality: Bilateral;  . TONSILLECTOMY AND ADENOIDECTOMY Bilateral 06/24/2013   Procedure: TONSILLECTOMY ;  Surgeon: Jodi Marble, MD;  Location: [place];  Service: ENT;  Laterality: Bilateral;  . TUMOR EXCISION     Hx: of right side of neck  . WISDOM TOOTH EXTRACTION     Medications: Reviewed & Updated - see associated section  Current Outpatient Medications:  .  acetaminophen (TYLENOL) 500 MG tablet, Take 1,000 mg by mouth every 6 (six) hours as needed for mild pain or headache., Disp: , Rfl:  .  cephALEXin (KEFLEX) 500 MG capsule, Take 1 capsule (500 mg total) by mouth 4 (four) times daily., Disp: 28 capsule, Rfl: 1 .  citalopram (CELEXA) 20 MG tablet, Take 20 mg by mouth daily., Disp: , Rfl:  .  norethindrone-ethinyl estradiol (LOESTRIN FE) 1-20 MG-MCG tablet, Take 1 tablet by mouth daily., Disp: 1 Package, Rfl: 11   Social History: Reviewed -  reports that she quit smoking about 3 years ago. Her smoking use included cigarettes. She quit after  1.00 year of use. She has never used smokeless tobacco.  Objective Findings:  Vitals: Last menstrual period 10/12/2019. BP (!) 155/84 (BP Location: Left Wrist, Patient Position: Sitting, Cuff Size: Normal)   Pulse (!) 58   Ht 5\' 8"  (1.727 m)   Wt (!) 349 lb (158.3 kg)   LMP 11/02/2019 (Exact Date)   BMI 53.07 kg/m   PHYSICAL EXAMINATION General appearance - alert, well appearing. anxious, oriented to person, place, and time, overweight and well hydrated Mental status - alert, oriented to person, place, and time, normal mood, behavior, speech, dress, motor activity, and thought processes, affect appropriate to mood Chest - not examined Heart - not examined Abdomen - not examined Breasts - not examined Skin - normal coloration and turgor, no rashes, no suspicious skin lesions noted  PELVIC External genitalia - normal female, slight moisture changes advise Monistat otc Vulva - sebaceous cyst x 2. One at right l. Minora just to right of posterior fourchette, one at right l majora, third at mons pubis all approx 1.5 cm maximum Vagina - normal Cervix -   Uterus -   Adnexa -  Wet [place] -  Rectal - rectal exam not indicated    Assessment & Plan:   A:  1. Concerns for blood pressure elevation without treatment due to lack of health insurance  P:  1. Rx Keflex 500 mg QID x7 days to keep on hand for inflammation flair up.  2. Rx Lisinopril /HCTZ 10/25   By signing my name below, I, 11/25, attest that this documentation has been prepared under the direction and in the presence of [employer], MD. Electronically Signed: Tilda Burrow Medical Scribe. 11/02/19. 4:22 PM.  I personally performed the services described in this documentation, which was SCRIBED in my presence. The recorded information has been reviewed and considered accurate. It has been edited as necessary during review. 11/04/19, MD

## 2019-12-12 ENCOUNTER — Encounter (HOSPITAL_COMMUNITY): Payer: Self-pay | Admitting: Emergency Medicine

## 2019-12-12 ENCOUNTER — Other Ambulatory Visit: Payer: Self-pay

## 2019-12-12 ENCOUNTER — Emergency Department (HOSPITAL_COMMUNITY)
Admission: EM | Admit: 2019-12-12 | Discharge: 2019-12-12 | Disposition: A | Discharge status: Home / Self Care | Payer: Self-pay | Attending: Emergency Medicine | Admitting: Emergency Medicine

## 2019-12-12 DIAGNOSIS — I10 Essential (primary) hypertension: Secondary | ICD-10-CM | POA: Insufficient documentation

## 2019-12-12 DIAGNOSIS — M5441 Lumbago with sciatica, right side: Secondary | ICD-10-CM | POA: Insufficient documentation

## 2019-12-12 DIAGNOSIS — R1909 Other intra-abdominal and pelvic swelling, mass and lump: Secondary | ICD-10-CM | POA: Insufficient documentation

## 2019-12-12 DIAGNOSIS — Z79899 Other long term (current) drug therapy: Secondary | ICD-10-CM | POA: Insufficient documentation

## 2019-12-12 DIAGNOSIS — Z87891 Personal history of nicotine dependence: Secondary | ICD-10-CM | POA: Insufficient documentation

## 2019-12-12 DIAGNOSIS — G44209 Tension-type headache, unspecified, not intractable: Secondary | ICD-10-CM | POA: Insufficient documentation

## 2019-12-12 LAB — COMPREHENSIVE METABOLIC PANEL
ALT: 21 U/L (ref 0–44)
AST: 21 U/L (ref 15–41)
Albumin: 3.1 g/dL — ABNORMAL LOW (ref 3.5–5.0)
Alkaline Phosphatase: 29 U/L — ABNORMAL LOW (ref 38–126)
Anion gap: 6 (ref 5–15)
BUN: 15 mg/dL (ref 6–20)
CO2: 25 mmol/L (ref 22–32)
Calcium: 8.3 mg/dL — ABNORMAL LOW (ref 8.9–10.3)
Chloride: 106 mmol/L (ref 98–111)
Creatinine, Ser: 0.64 mg/dL (ref 0.44–1.00)
GFR calc Af Amer: >60 mL/min (ref >60)
GFR calc non Af Amer: >60 mL/min (ref >60)
Glucose, Bld: 117 mg/dL — ABNORMAL HIGH (ref 70–99)
Potassium: 3.5 mmol/L (ref 3.5–5.1)
Sodium: 137 mmol/L (ref 135–145)
Total Bilirubin: 0.3 mg/dL (ref 0.3–1.2)
Total Protein: 6.6 g/dL (ref 6.5–8.1)

## 2019-12-12 LAB — CBC WITH DIFFERENTIAL/PLATELET
Abs Immature Granulocytes: 0.02 10*3/uL (ref 0.00–0.07)
Basophils Absolute: 0.1 10*3/uL (ref 0.0–0.1)
Basophils Relative: 1 %
Eosinophils Absolute: 0.4 10*3/uL (ref 0.0–0.5)
Eosinophils Relative: 4 %
HCT: 36.2 % (ref 36.0–46.0)
Hemoglobin: 11.3 g/dL — ABNORMAL LOW (ref 12.0–15.0)
Immature Granulocytes: 0 %
Lymphocytes Relative: 39 %
Lymphs Abs: 3.7 10*3/uL (ref 0.7–4.0)
MCH: 23 pg — ABNORMAL LOW (ref 26.0–34.0)
MCHC: 31.2 g/dL (ref 30.0–36.0)
MCV: 73.7 fL — ABNORMAL LOW (ref 80.0–100.0)
Monocytes Absolute: 0.9 10*3/uL (ref 0.1–1.0)
Monocytes Relative: 9 %
Neutro Abs: 4.5 10*3/uL (ref 1.7–7.7)
Neutrophils Relative %: 47 %
Platelets: 280 10*3/uL (ref 150–400)
RBC: 4.91 MIL/uL (ref 3.87–5.11)
RDW: 15.2 % (ref 11.5–15.5)
WBC: 9.5 10*3/uL (ref 4.0–10.5)
nRBC: 0 % (ref 0.0–0.2)

## 2019-12-12 LAB — I-STAT BETA HCG BLOOD, ED (MC, WL, AP ONLY): I-stat hCG, quantitative: <5 m[IU]/mL (ref <5)

## 2019-12-12 MED ORDER — KETOROLAC TROMETHAMINE 30 MG/ML IJ SOLN
30.0000 mg | Freq: Once | INTRAMUSCULAR | Status: AC
  Administered 2019-12-12: 30 mg via INTRAVENOUS
  Filled 2019-12-12: qty 1

## 2019-12-12 MED ORDER — SODIUM CHLORIDE 0.9 % IV BOLUS
500.0000 mL | Freq: Once | INTRAVENOUS | Status: AC
  Administered 2019-12-12: 500 mL via INTRAVENOUS

## 2019-12-12 MED ORDER — DOXYCYCLINE HYCLATE 100 MG PO CAPS: 100.0000 mg | ORAL_CAPSULE | Freq: Two times a day (BID) | ORAL | 0 refills | Status: AC

## 2019-12-12 NOTE — ED Triage Notes (Signed)
Pt c/o HA, a knot on the back of her head, back pain, and a cyst on her abdomen.

## 2019-12-12 NOTE — Discharge Instructions (Signed)
You were seen in the emergency department today with headache, back pain, and painful cyst areas.  I am starting you on a new antibiotic to help with the cysts.  Please follow closely with your OB/GYN.  These do not require drainage at this time.  Your blood pressure was elevated on arrival but improved somewhat here in the emergency department.  Please continue taking your high blood pressure medicine and follow closely with your prescribing doctor to decide if the dose is correct or if additional medicines are required.  Return to the emergency department any new or suddenly worsening symptoms.

## 2019-12-12 NOTE — ED Provider Notes (Signed)
Emergency Department Provider Note   I have reviewed the triage vital signs and the nursing notes.   HISTORY  Chief Complaint multiple complaints   HPI Jacqueline Alvarado is a 24 y.o. female with past medical history reviewed below presents to the emergency department with back and hip pain, inguinal area cysts, abdominal discomfort, and painful neck cyst with associated migraine type headache.  Patient states that she is intermittently had these symptoms over the past year.  She is followed by her OB/GYN who manages her recurrent cysts as well as her blood pressure.  She was on Bactrim a month ago with improvement in symptoms.  She started taking Bactrim again over the past 3 days but her inguinal cyst areas continue to be painful.  She denies fevers.  There is no surrounding redness or drainage in the areas.  She is also experiencing a painful cyst type area and the left posterior neck.  She notes developing some associated headache which feels typical of her migraine headaches.  She states that this along with her painful cysts and pain radiating from the top of her buttock down her leg are her primary reasons for coming in tonight.  She denies any weakness or numbness in the leg.  No groin numbness.  No midline back pain or dysuria, hesitancy, urgency.  She has been compliant with her blood pressure medications.  Patient is s/p cholecystectomy.    Past Medical History:  Diagnosis Date  . Abdominal pain, chronic, right upper quadrant   . Abnormal Pap smear of cervix   . Anemia    iron deficiency  . Anxiety   . BPD (bronchopulmonary dysplasia)   . Depression   . Headache(784.0)   . Papanicolaou smear of cervix with positive high risk human papilloma virus (HPV) test 05/31/2019   05/2019 repeat HPV in 1 year   . PONV (postoperative nausea and vomiting)    Hx: of nausea only at age 49    Patient Active Problem List   Diagnosis Date Noted  . Papanicolaou smear of cervix with positive  high risk human papilloma virus (HPV) test 05/31/2019  . Elevated BP without diagnosis of hypertension 05/24/2019  . Encounter for initial prescription of contraceptive pills 05/24/2019  . Menorrhagia with irregular cycle 05/24/2019  . Pregnancy examination or test, negative result 05/24/2019  . Encounter for gynecological examination with Papanicolaou smear of cervix 05/24/2019  . Acute cholecystitis 02/01/2017  . Calculus of gallbladder with acute cholecystitis without obstruction   . Biliary colic   . Abdominal pain 01/29/2017  . Gallstones   . Inflammation of the retroperitoneum   . Cicatrix   . Rectal bleeding 12/20/2015  . RUQ pain 12/20/2015  . Hemorrhoids 12/20/2015  . Contraceptive management 09/10/2015  . Trichomonal vaginitis 09/10/2015  . BV (bacterial vaginosis) 09/10/2015  . Hypertrophy of nasal turbinates 06/24/2013  . Tonsillar hypertrophy 06/24/2013  . Obstructive sleep apnea (adult) (pediatric) 06/24/2013  . Morbid obesity ([place]) 06/24/2013    Past Surgical History:  Procedure Laterality Date  . CHOLECYSTECTOMY N/A 02/02/2017   Procedure: LAPAROSCOPIC CHOLECYSTECTOMY;  Surgeon: Aviva Signs, MD;  Location: [facility];  Service: General;  Laterality: N/A;  . COLONOSCOPY N/A 01/07/2016   Dr. Oneida Alar: Nonbleeding internal hemorrhoids, large. 3 bands successfully placed. Distal ileum containing multiple ulcers which she felt was related to NSAIDs. Biopsies nonspecific.  Marland Kitchen HEMORRHOID BANDING N/A 01/07/2016   Procedure: HEMORRHOID BANDING;  Surgeon: Danie Binder, MD;  Location: [facility];  Service:  Endoscopy;  Laterality: N/A;  . MASS EXCISION N/A 11/26/2016   Procedure: EXCISION OF ABDOMINAL CICATRIX 4CM;  Surgeon: Franky Macho, MD;  Location: [facility];  Service: General;  Laterality: N/A;  p knows to arrive at 7:50  . NASAL SEPTOPLASTY W/ TURBINOPLASTY Bilateral 06/24/2013   Procedure: TURBINATE REDUCTION;  Surgeon: Flo Shanks, MD;  Location: [facility] OR;  Service: ENT;   Laterality: Bilateral;  . TONSILLECTOMY AND ADENOIDECTOMY Bilateral 06/24/2013   Procedure: TONSILLECTOMY ;  Surgeon: Flo Shanks, MD;  Location: [facility] OR;  Service: ENT;  Laterality: Bilateral;  . TUMOR EXCISION     Hx: of right side of neck  . WISDOM TOOTH EXTRACTION      Allergies Patient has no known allergies.  Family History  Problem Relation Age of Onset  . Cancer - Other Mother   . Arthritis Mother   . Hypertension Father   . COPD Father   . Cancer - Cervical Other   . Diabetes Other   . Stroke Other     Social History Social History   Tobacco Use  . Smoking status: Former Smoker    Years: 1.00    Types: Cigarettes    Quit date: 10/22/2016    Years since quitting: 3.1  . Smokeless tobacco: Never Used  . Tobacco comment: smoked only 1-2 cig weekly for 1 year  Substance Use Topics  . Alcohol use: Yes    Alcohol/week: 0.0 standard drinks    Comment: occasional  . Drug use: Yes    Frequency: 7.0 times per week    Types: Marijuana    Comment: 3x/week    Review of Systems  Constitutional: No fever/chills Eyes: No visual changes. Cardiovascular: Denies chest pain. Respiratory: Denies shortness of breath. Gastrointestinal: Positive abdominal pain. Occasional nausea, no vomiting.  No diarrhea.  No constipation. Genitourinary: Negative for dysuria. Musculoskeletal: Negative for back pain. Positive right buttock pain radiating down the right leg.  Skin: Neck and inguinal cysts.  Neurological: Negative for focal weakness or numbness. Positive HA typical of migraine.   10-point ROS otherwise negative.  ____________________________________________   PHYSICAL EXAM:  VITAL SIGNS: ED Triage Vitals  Enc Vitals Group     BP 12/12/19 1938 (!) 163/116     Pulse Rate 12/12/19 1938 86     Resp 12/12/19 1938 18     Temp 12/12/19 1938 97.6 F (36.4 C)     Temp Source 12/12/19 1938 Oral     SpO2 12/12/19 1938 98 %     Weight 12/12/19 1937 (!) 342 lb (155.1 kg)      Height 12/12/19 1937 5\' 8"  (1.727 m)   Constitutional: Alert and oriented. Well appearing and in no acute distress. Eyes: Conjunctivae are normal.  Head: Atraumatic. Nose: No congestion/rhinnorhea. Mouth/Throat: Mucous membranes are moist.  Oropharynx non-erythematous. No exudate.  Neck: No stridor.  No meningeal signs.  0.25 cm firm, mobile area just to the right of the cervical spine. No surrounding erythema. No warmth or fluctuance.  Cardiovascular: Normal rate, regular rhythm. Good peripheral circulation. Grossly normal heart sounds.   Respiratory: Normal respiratory effort.  No retractions. Lungs CTAB. Gastrointestinal: Soft and nontender. No RUQ tenderness. No distention.  Musculoskeletal: No lower extremity tenderness nor edema. No gross deformities of extremities. No midline lumbar spine tenderness.  Neurologic:  Normal speech and language. No gross focal neurologic deficits are appreciated.  Skin:  Skin is warm and dry.  Multiple multiple firm nodules in the suprapubic and right inguinal area.  There  is no surrounding erythema, warmth, fluctuance on exam.   ____________________________________________   LABS (all labs ordered are listed, but only abnormal results are displayed)  Labs Reviewed  COMPREHENSIVE METABOLIC PANEL - Abnormal; Notable for the following components:      Result Value   Glucose, Bld 117 (*)    Calcium 8.3 (*)    Albumin 3.1 (*)    Alkaline Phosphatase 29 (*)    All other components within normal limits  CBC WITH DIFFERENTIAL/PLATELET - Abnormal; Notable for the following components:   Hemoglobin 11.3 (*)    MCV 73.7 (*)    MCH 23.0 (*)    All other components within normal limits  I-STAT BETA HCG BLOOD, ED ([facility], [facility], [facility] ONLY)   ____________________________________________  RADIOLOGY  None  ____________________________________________   PROCEDURES  Procedure(s) performed:   Procedures  None   ____________________________________________   INITIAL IMPRESSION / ASSESSMENT AND PLAN / ED COURSE  Pertinent labs & imaging results that were available during my care of the patient were reviewed by me and considered in my medical decision making (see chart for details).   Patient presents to the emergency department with multiple complaints including cysts, right leg/buttock pain, abdominal discomfort.  The patient's cystic areas are firm, mobile, nonerythematous.  They are not amenable to incision and drainage.  Plan to start on doxycycline and follow with her GYN who is managing these primarily.  Patient also experiencing some headache which is typical of her migraine.  Will obtain screening blood work with her elevated blood pressure and abdominal discomfort along with a pregnancy test.  With a negative pregnancy test, we will move ahead with headache treatment.  Abdomen is diffusely soft and nontender.  I do not see an indication for abdominal imaging at this time. Discussed elevated BP and she is in active discussion with her Ob who is managing this currently. No evidence to suspect HTN emergency. No findings to suspect serious infection or sepsis.   10:05 PM  Patient's labs reviewed labs reviewed with no acute findings.  LFTs and bilirubin are normal.  Doubt ovarian pathology as patient's low back pain symptoms are reproducible.  Cystic areas do not require incision and drainage at this time. Plan for Toradol for HA and back pain with plan for close PCP follow up. Discussed ED return precautions.  ____________________________________________  FINAL CLINICAL IMPRESSION(S) / ED DIAGNOSES  Final diagnoses:  Acute non intractable tension-type headache  Acute right-sided low back pain with right-sided sciatica     MEDICATIONS GIVEN DURING THIS VISIT:  Medications  sodium chloride 0.9 % bolus 500 mL (0 mLs Intravenous Stopped 12/12/19 2214)  ketorolac (TORADOL) 30 MG/ML injection 30 mg  (30 mg Intravenous Given 12/12/19 2212)     NEW OUTPATIENT MEDICATIONS STARTED DURING THIS VISIT:  Discharge Medication List as of 12/12/2019 10:08 PM    START taking these medications   Details  doxycycline (VIBRAMYCIN) 100 MG capsule Take 1 capsule (100 mg total) by mouth 2 (two) times daily for 7 days., Starting Mon 12/12/2019, Until Mon 12/19/2019, Normal        Note:  This document was prepared using Dragon voice recognition software and may include unintentional dictation errors.  Alona Bene, MD, [facility] Emergency Medicine    Ameir Faria, Arlyss Repress, MD 12/12/19 [phone]

## 2020-03-16 ENCOUNTER — Ambulatory Visit
Admission: EM | Admit: 2020-03-16 | Discharge: 2020-03-16 | Disposition: A | Discharge status: Home / Self Care | Payer: Self-pay

## 2020-03-16 ENCOUNTER — Other Ambulatory Visit: Payer: Self-pay

## 2020-04-11 ENCOUNTER — Emergency Department (HOSPITAL_COMMUNITY): Payer: Self-pay

## 2020-04-11 ENCOUNTER — Emergency Department (HOSPITAL_COMMUNITY)
Admission: EM | Admit: 2020-04-11 | Discharge: 2020-04-11 | Disposition: A | Discharge status: Home / Self Care | Payer: Self-pay | Attending: Emergency Medicine | Admitting: Emergency Medicine

## 2020-04-11 ENCOUNTER — Other Ambulatory Visit: Payer: Self-pay

## 2020-04-11 ENCOUNTER — Encounter (HOSPITAL_COMMUNITY): Payer: Self-pay

## 2020-04-11 DIAGNOSIS — U071 COVID-19: Secondary | ICD-10-CM | POA: Insufficient documentation

## 2020-04-11 DIAGNOSIS — Z79899 Other long term (current) drug therapy: Secondary | ICD-10-CM | POA: Insufficient documentation

## 2020-04-11 DIAGNOSIS — R519 Headache, unspecified: Secondary | ICD-10-CM

## 2020-04-11 DIAGNOSIS — R1011 Right upper quadrant pain: Secondary | ICD-10-CM

## 2020-04-11 DIAGNOSIS — Z87891 Personal history of nicotine dependence: Secondary | ICD-10-CM | POA: Insufficient documentation

## 2020-04-11 LAB — URINALYSIS, ROUTINE W REFLEX MICROSCOPIC
Bilirubin Urine: NEGATIVE
Glucose, UA: NEGATIVE mg/dL
Hgb urine dipstick: NEGATIVE
Ketones, ur: NEGATIVE mg/dL
Leukocytes,Ua: NEGATIVE
Nitrite: NEGATIVE
Protein, ur: NEGATIVE mg/dL
Specific Gravity, Urine: 1.02 (ref 1.005–1.030)
pH: 6 (ref 5.0–8.0)

## 2020-04-11 LAB — CBC WITH DIFFERENTIAL/PLATELET
Abs Immature Granulocytes: 0.02 10*3/uL (ref 0.00–0.07)
Basophils Absolute: 0 10*3/uL (ref 0.0–0.1)
Basophils Relative: 1 %
Eosinophils Absolute: 0.3 10*3/uL (ref 0.0–0.5)
Eosinophils Relative: 4 %
HCT: 37.4 % (ref 36.0–46.0)
Hemoglobin: 11.5 g/dL — ABNORMAL LOW (ref 12.0–15.0)
Immature Granulocytes: 0 %
Lymphocytes Relative: 27 %
Lymphs Abs: 2.3 10*3/uL (ref 0.7–4.0)
MCH: 23 pg — ABNORMAL LOW (ref 26.0–34.0)
MCHC: 30.7 g/dL (ref 30.0–36.0)
MCV: 74.9 fL — ABNORMAL LOW (ref 80.0–100.0)
Monocytes Absolute: 0.7 10*3/uL (ref 0.1–1.0)
Monocytes Relative: 8 %
Neutro Abs: 5.2 10*3/uL (ref 1.7–7.7)
Neutrophils Relative %: 60 %
Platelets: 310 10*3/uL (ref 150–400)
RBC: 4.99 MIL/uL (ref 3.87–5.11)
RDW: 15.6 % — ABNORMAL HIGH (ref 11.5–15.5)
WBC: 8.6 10*3/uL (ref 4.0–10.5)
nRBC: 0 % (ref 0.0–0.2)

## 2020-04-11 LAB — COMPREHENSIVE METABOLIC PANEL
ALT: 16 U/L (ref 0–44)
AST: 17 U/L (ref 15–41)
Albumin: 3.2 g/dL — ABNORMAL LOW (ref 3.5–5.0)
Alkaline Phosphatase: 18 U/L — ABNORMAL LOW (ref 38–126)
Anion gap: 7 (ref 5–15)
BUN: 12 mg/dL (ref 6–20)
CO2: 26 mmol/L (ref 22–32)
Calcium: 8.5 mg/dL — ABNORMAL LOW (ref 8.9–10.3)
Chloride: 103 mmol/L (ref 98–111)
Creatinine, Ser: 0.77 mg/dL (ref 0.44–1.00)
GFR calc Af Amer: >60 mL/min (ref >60)
GFR calc non Af Amer: >60 mL/min (ref >60)
Glucose, Bld: 91 mg/dL (ref 70–99)
Potassium: 4.2 mmol/L (ref 3.5–5.1)
Sodium: 136 mmol/L (ref 135–145)
Total Bilirubin: 0.5 mg/dL (ref 0.3–1.2)
Total Protein: 6.9 g/dL (ref 6.5–8.1)

## 2020-04-11 LAB — POC URINE PREG, ED: Preg Test, Ur: NEGATIVE

## 2020-04-11 MED ORDER — ACETAMINOPHEN 325 MG PO TABS
650.0000 mg | ORAL_TABLET | Freq: Once | ORAL | Status: AC
  Administered 2020-04-11: 650 mg via ORAL
  Filled 2020-04-11: qty 2

## 2020-04-11 MED ORDER — PROCHLORPERAZINE EDISYLATE 10 MG/2ML IJ SOLN
10.0000 mg | Freq: Once | INTRAMUSCULAR | Status: AC
  Administered 2020-04-11: 10 mg via INTRAVENOUS
  Filled 2020-04-11: qty 2

## 2020-04-11 MED ORDER — SODIUM CHLORIDE 0.9 % IV BOLUS
1000.0000 mL | Freq: Once | INTRAVENOUS | Status: AC
  Administered 2020-04-11: 1000 mL via INTRAVENOUS

## 2020-04-11 MED ORDER — DIPHENHYDRAMINE HCL 50 MG/ML IJ SOLN
25.0000 mg | Freq: Once | INTRAMUSCULAR | Status: AC
  Administered 2020-04-11: 25 mg via INTRAVENOUS
  Filled 2020-04-11: qty 1

## 2020-04-11 NOTE — ED Provider Notes (Signed)
[facility] EMERGENCY DEPARTMENT Provider Note   CSN: [id number] Arrival date & time: 04/11/20  0850     History Chief Complaint  Patient presents with  . covid    Jacqueline Alvarado is a 24 y.o. female.  HPI 24 year old female with a history of anxiety, depression, bronchopulmonary dysplasia, anemia, gallstones with cholecystectomy, obesity, OSA presents to the ER with complaints of right upper quadrant pain and headaches in the setting of COVID-19.  Patient states that she was diagnosed with COVID-19 on 03/16/2020, has been doing supportive measures including lots of fluids, soups, states she felt like she was feeling a little better, had her taste and smell return, but then this week feels like she has taken a turn for the worse.  She complains of some nausea and right upper quadrant pain which feels like her old gallstone pain.  She does not have gallbladder though however.  She states that sometimes her pain will radiate into her back.  She endorses poor p.o. intake.  Denies any fevers or chills.  States she has some mucus in her stools, last bowel movement was this morning and normal.  Denies any blood in her stools.  Denies any dysuria or hematuria.  She denies any chest pain or shortness of breath, but is concerned given that she is now feeling worse than before.  She also endorses a headache, but states that it is not uncommon for her to get migraines.  She has been taking Tylenol for this as she was told that she cannot take ibuprofen due to an ulcer.  She does not have a GI doctor.  She also states that she will get rashes on her arms that will wax and wane over a few days.   Past Medical History:  Diagnosis Date  . Abdominal pain, chronic, right upper quadrant   . Abnormal Pap smear of cervix   . Anemia    iron deficiency  . Anxiety   . BPD (bronchopulmonary dysplasia)   . Depression   . Headache(784.0)   . Papanicolaou smear of cervix with positive high risk human papilloma virus  (HPV) test 05/31/2019   05/2019 repeat HPV in 1 year   . PONV (postoperative nausea and vomiting)    Hx: of nausea only at age 24    Patient Active Problem List   Diagnosis Date Noted  . Papanicolaou smear of cervix with positive high risk human papilloma virus (HPV) test 05/31/2019  . Elevated BP without diagnosis of hypertension 05/24/2019  . Encounter for initial prescription of contraceptive pills 05/24/2019  . Menorrhagia with irregular cycle 05/24/2019  . Pregnancy examination or test, negative result 05/24/2019  . Encounter for gynecological examination with Papanicolaou smear of cervix 05/24/2019  . Acute cholecystitis 02/01/2017  . Calculus of gallbladder with acute cholecystitis without obstruction   . Biliary colic   . Abdominal pain 01/29/2017  . Gallstones   . Inflammation of the retroperitoneum   . Cicatrix   . Rectal bleeding 12/20/2015  . RUQ pain 12/20/2015  . Hemorrhoids 12/20/2015  . Contraceptive management 09/10/2015  . Trichomonal vaginitis 09/10/2015  . BV (bacterial vaginosis) 09/10/2015  . Hypertrophy of nasal turbinates 06/24/2013  . Tonsillar hypertrophy 06/24/2013  . Obstructive sleep apnea (adult) (pediatric) 06/24/2013  . Morbid obesity (HCC) 06/24/2013    Past Surgical History:  Procedure Laterality Date  . CHOLECYSTECTOMY N/A 02/02/2017   Procedure: LAPAROSCOPIC CHOLECYSTECTOMY;  Surgeon: Franky MachoJenkins, Mark, MD;  Location: [facility] ORS;  Service: General;  Laterality: N/A;  .  COLONOSCOPY N/A 01/07/2016   Dr. Darrick Penna: Nonbleeding internal hemorrhoids, large. 3 bands successfully placed. Distal ileum containing multiple ulcers which she felt was related to NSAIDs. Biopsies nonspecific.  Marland Kitchen HEMORRHOID BANDING N/A 01/07/2016   Procedure: HEMORRHOID BANDING;  Surgeon: West Bali, MD;  Location: [facility] SUITE;  Service: Endoscopy;  Laterality: N/A;  . MASS EXCISION N/A 11/26/2016   Procedure: EXCISION OF ABDOMINAL CICATRIX 4CM;  Surgeon: Franky Macho, MD;   Location: [facility] ORS;  Service: General;  Laterality: N/A;  p knows to arrive at 7:50  . NASAL SEPTOPLASTY W/ TURBINOPLASTY Bilateral 06/24/2013   Procedure: TURBINATE REDUCTION;  Surgeon: Flo Shanks, MD;  Location: [facility] OR;  Service: ENT;  Laterality: Bilateral;  . TONSILLECTOMY AND ADENOIDECTOMY Bilateral 06/24/2013   Procedure: TONSILLECTOMY ;  Surgeon: Flo Shanks, MD;  Location: [facility] OR;  Service: ENT;  Laterality: Bilateral;  . TUMOR EXCISION     Hx: of right side of neck  . WISDOM TOOTH EXTRACTION       OB History    Gravida  0   Para  0   Term  0   Preterm  0   AB  0   Living  0     SAB  0   TAB  0   Ectopic  0   Multiple  0   Live Births  0           Family History  Problem Relation Age of Onset  . Cancer - Other Mother   . Arthritis Mother   . Hypertension Father   . COPD Father   . Cancer - Cervical Other   . Diabetes Other   . Stroke Other     Social History   Tobacco Use  . Smoking status: Former Smoker    Years: 1.00    Types: Cigarettes    Quit date: 10/22/2016    Years since quitting: 3.4  . Smokeless tobacco: Never Used  . Tobacco comment: smoked only 1-2 cig weekly for 1 year  Vaping Use  . Vaping Use: Never used  Substance Use Topics  . Alcohol use: Yes    Alcohol/week: 0.0 standard drinks    Comment: occasional  . Drug use: Yes    Frequency: 7.0 times per week    Types: Marijuana    Comment: 3x/week    Home Medications Prior to Admission medications   Medication Sig Start Date End Date Taking? Authorizing Provider  citalopram (CELEXA) 20 MG tablet Take 20 mg by mouth daily.    [provider]  lisinopril-hydrochlorothiazide (ZESTORETIC) 20-12.5 MG tablet Take 1 tablet by mouth daily. 11/02/19   Tilda Burrow, MD  norethindrone-ethinyl estradiol (LOESTRIN FE) 1-20 MG-MCG tablet Take 1 tablet by mouth daily. 09/13/19 09/12/20  Adline Potter, NP  Oxymetazoline HCl (NASAL SPRAY) 0.05 % SOLN Place 1-2 sprays into  the nose daily as needed (for congestion).    [provider]    Allergies    Patient has no known allergies.  Review of Systems   Review of Systems  Constitutional: Negative for chills and fever.  HENT: Negative for ear pain and sore throat.   Eyes: Negative for pain and visual disturbance.  Respiratory: Negative for cough and shortness of breath.   Cardiovascular: Negative for chest pain and palpitations.  Gastrointestinal: Positive for abdominal pain and nausea. Negative for vomiting.  Genitourinary: Negative for dysuria and hematuria.  Musculoskeletal: Negative for arthralgias, back pain, neck pain and neck stiffness.  Skin: Negative for color change and rash.  Neurological: Positive for weakness and headaches. Negative for seizures and syncope.  All other systems reviewed and are negative.   Physical Exam Updated Vital Signs BP (!) 143/99 (BP Location: Right Arm)   Pulse 73   Temp 100.1 F (37.8 C) (Oral)   Resp 20   Ht 5\' 8"  (1.727 m)   Wt (!) 158.8 kg   LMP 03/21/2020   SpO2 100%   BMI 53.22 kg/m   Physical Exam Vitals and nursing note reviewed.  Constitutional:      General: She is not in acute distress.    Appearance: She is well-developed. She is obese. She is not ill-appearing, toxic-appearing or diaphoretic.  HENT:     Head: Normocephalic and atraumatic.  Eyes:     Conjunctiva/sclera: Conjunctivae normal.  Cardiovascular:     Rate and Rhythm: Normal rate and regular rhythm.     Heart sounds: No murmur heard.   Pulmonary:     Effort: Pulmonary effort is normal. No respiratory distress.     Breath sounds: Normal breath sounds.  Abdominal:     Palpations: Abdomen is soft.     Tenderness: There is abdominal tenderness. There is no right CVA tenderness, left CVA tenderness or guarding.     Comments: Mild right upper quadrant tenderness and rib cage tenderness.  Negative Murphy's  Musculoskeletal:        General: No deformity or signs of injury.       Cervical back: Neck supple.     Right lower leg: No edema.     Left lower leg: No edema.     Comments: No C, T, L-spine tenderness.  No flank tenderness.  No paraspinal muscle tenderness.  Moving all 4 extremities without difficulty.  Skin:    General: Skin is warm and dry.     Findings: No erythema or rash.     Comments: No visible rashes on upper and lower extremities or torso   Neurological:     General: No focal deficit present.     Mental Status: She is alert and oriented to person, place, and time.     Sensory: No sensory deficit.     Motor: No weakness.     Comments: Gross neurologic exam normal, pupils equal and reactive, smile equal, no dysarthria, equal grip strength in upper extremities, equal 5/5 strength in lower extremities.     ED Results / Procedures / Treatments   Labs (all labs ordered are listed, but only abnormal results are displayed) Labs Reviewed  CBC WITH DIFFERENTIAL/PLATELET - Abnormal; Notable for the following components:      Result Value   Hemoglobin 11.5 (*)    MCV 74.9 (*)    MCH 23.0 (*)    RDW 15.6 (*)    All other components within normal limits  COMPREHENSIVE METABOLIC PANEL - Abnormal; Notable for the following components:   Calcium 8.5 (*)    Albumin 3.2 (*)    Alkaline Phosphatase 18 (*)    All other components within normal limits  URINALYSIS, ROUTINE W REFLEX MICROSCOPIC  POC URINE PREG, ED    EKG None  Radiology DG Chest Portable 1 View  Result Date: 04/11/2020 CLINICAL DATA:  Right upper quadrant pain, fever.  COVID-19 positive EXAM: PORTABLE CHEST 1 VIEW COMPARISON:  07/10/2018 FINDINGS: The heart size is upper limits of normal, likely accentuated by AP technique. No focal airspace consolidation, pleural effusion, or pneumothorax. The visualized skeletal structures are  unremarkable. Surgical clips within the right neck. IMPRESSION: No active disease. Electronically Signed   By: Duanne Guess D.O.   On: 04/11/2020 10:21     Procedures Procedures (including critical care time)  Medications Ordered in ED Medications  acetaminophen (TYLENOL) tablet 650 mg (has no administration in time range)  prochlorperazine (COMPAZINE) injection 10 mg (10 mg Intravenous Given 04/11/20 1031)  diphenhydrAMINE (BENADRYL) injection 25 mg (25 mg Intravenous Given 04/11/20 1030)  sodium chloride 0.9 % bolus 1,000 mL (1,000 mLs Intravenous New Bag/Given 04/11/20 1102)    ED Course  I have reviewed the triage vital signs and the nursing notes.  Pertinent labs & imaging results that were available during my care of the patient were reviewed by me and considered in my medical decision making (see chart for details).    MDM Rules/Calculators/A&P                         24 year old female with concerns of right upper quadrant pain and headache in the setting of COVID-19 On presentation, she is alert, oriented, nontoxic-appearing, no acute distress, speaking full sentences without increased work of breathing.  Her vital signs are overall reassuring, though she is slightly hypertensive with a blood pressure 143/99 and a low-grade temp of 100.1.  Physical exam with right upper quadrant tenderness, negative Murphy's.  She also has some chest wall tenderness as well.  Lung sounds clear, no other abdominal tenderness.  CMP with no significant abnormalities, normal LFTs, normal bili.  CBC without leukocytosis or other abnormalities.  UA without evidence of UTI.  Negative pregnancy.  Chest x-ray without acute abnormalities.  Given that the patient has had a prior cholecystectomy, do not think there is an indication for right upper quadrant ultrasound at this time.  I partook in shared decision-making with the patient in terms of further evaluating her right upper quadrant pain with a CT scan.  I discussed the reassuring LFTs with her, she at this time would like to hold on the CT scan as she does not think it is indicated.  I think this is  reasonable.  Patient was given a migraine cocktail and 1 L of fluids.  On reevaluation, she notes significant improvement in her symptoms.  I discussed with the patient that she likely would benefit from further GI follow-up as she seems to have chronic issues with abdominal pain.  She is agreeable to this.  I also encouraged her to follow-up with her primary care doctor which she voiced understanding to and is agreeable.  Strict return precautions discussed.  At this stage in the ED course, the patient is medically screened and is stable for discharge.   Final Clinical Impression(s) / ED Diagnoses Final diagnoses:  RUQ pain  Bad headache  COVID-19    Rx / DC Orders ED Discharge Orders    None       Leone Brand 04/11/20 1139    Geoffery Lyons, MD 04/11/20 1336

## 2020-04-11 NOTE — ED Triage Notes (Signed)
Pt reports covid positive since Sept 3.  Reports still feeling "crappy."  Pt says still has low grade fever, frequent headaches, and ruq pain.  Describes as sharp.

## 2020-04-11 NOTE — Discharge Instructions (Signed)
Thank you for letting me take care of you in the ER today.  As discussed, your lab work here was overall reassuring.  Please make sure to follow-up with the GI doctor whose contact information I provided in your discharge paperwork.  Please also follow-up with your primary care doctor.  Return to the ER for any new or worsening symptoms.

## 2020-05-21 ENCOUNTER — Telehealth: Payer: Self-pay | Admitting: Women's Health

## 2020-05-21 NOTE — Telephone Encounter (Signed)
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  In our opinion is pt emotionally & physically able to work with children?  Pt states her PCP couldn't do this until sometime next week  Please advise & notify pt

## 2020-07-25 ENCOUNTER — Ambulatory Visit
Admission: EM | Admit: 2020-07-25 | Discharge: 2020-07-25 | Disposition: A | Discharge status: Home / Self Care | Payer: Self-pay | Attending: Family Medicine | Admitting: Family Medicine

## 2020-07-25 ENCOUNTER — Other Ambulatory Visit: Payer: Self-pay

## 2020-07-25 ENCOUNTER — Encounter: Payer: Self-pay | Admitting: Emergency Medicine

## 2020-07-25 DIAGNOSIS — M5441 Lumbago with sciatica, right side: Secondary | ICD-10-CM

## 2020-07-25 MED ORDER — KETOROLAC TROMETHAMINE 30 MG/ML IJ SOLN
30.0000 mg | Freq: Once | INTRAMUSCULAR | Status: AC
  Administered 2020-07-25: 30 mg via INTRAMUSCULAR

## 2020-07-25 MED ORDER — MELOXICAM 7.5 MG PO TABS: 7.5000 mg | ORAL_TABLET | Freq: Every day | ORAL | 0 refills | Status: DC

## 2020-07-25 MED ORDER — PREDNISONE 10 MG (21) PO TBPK: ORAL_TABLET | Freq: Every day | ORAL | 0 refills | Status: AC

## 2020-07-25 MED ORDER — DEXAMETHASONE SODIUM PHOSPHATE 10 MG/ML IJ SOLN
10.0000 mg | Freq: Once | INTRAMUSCULAR | Status: AC
  Administered 2020-07-25: 10 mg via INTRAMUSCULAR

## 2020-07-25 MED ORDER — TIZANIDINE HCL 4 MG PO TABS: 4.0000 mg | ORAL_TABLET | Freq: Four times a day (QID) | ORAL | 0 refills | Status: DC | PRN

## 2020-07-25 NOTE — ED Provider Notes (Signed)
[facility]ARE [facility]ENTER   [id number] 07/25/20 Arrival Time: 1040  [id number] PAIN  SUBJECTIVE: History from: patient. Jacqueline Alvarado is a 25 y.o. female complains of right-sided low back pain that radiates down through her right hip down to her right knee.  Reports that this began yesterday.  Reports that she has history of sciatic nerve pain.  Reports that she has taken Tylenol with no relief.  Reports that she was not able to go to work today, she is a Advertising copywriter. Symptoms are made worse with activity.  Denies fever, chills, erythema, ecchymosis, effusion, weakness, numbness and tingling, saddle paresthesias, loss of bowel or bladder function.      ROS: As per HPI.  All other pertinent ROS negative.     Past Medical History:  Diagnosis Date  . Abdominal pain, chronic, right upper quadrant   . Abnormal Pap smear of cervix   . Anemia    iron deficiency  . Anxiety   . BPD (bronchopulmonary dysplasia)   . Depression   . Headache(784.0)   . Papanicolaou smear of cervix with positive high risk human papilloma virus (HPV) test 05/31/2019   05/2019 repeat HPV in 1 year   . PONV (postoperative nausea and vomiting)    Hx: of nausea only at age 50   Past Surgical History:  Procedure Laterality Date  . CHOLECYSTECTOMY N/A 02/02/2017   Procedure: LAPAROSCOPIC CHOLECYSTECTOMY;  Surgeon: Franky Macho, MD;  Location: [facility] ORS;  Service: General;  Laterality: N/A;  . COLONOSCOPY N/A 01/07/2016   Dr. Darrick Penna: Nonbleeding internal hemorrhoids, large. 3 bands successfully placed. Distal ileum containing multiple ulcers which she felt was related to NSAIDs. Biopsies nonspecific.  Marland Kitchen HEMORRHOID BANDING N/A 01/07/2016   Procedure: HEMORRHOID BANDING;  Surgeon: West Bali, MD;  Location: [facility];  Service: Endoscopy;  Laterality: N/A;  . MASS EXCISION N/A 11/26/2016   Procedure: EXCISION OF ABDOMINAL CICATRIX 4CM;  Surgeon: Franky Macho, MD;  Location: [facility] ORS;  Service: General;  Laterality: N/A;  p  knows to arrive at 7:50  . NASAL SEPTOPLASTY W/ TURBINOPLASTY Bilateral 06/24/2013   Procedure: TURBINATE REDUCTION;  Surgeon: Flo Shanks, MD;  Location: [facility] OR;  Service: ENT;  Laterality: Bilateral;  . TONSILLECTOMY AND ADENOIDECTOMY Bilateral 06/24/2013   Procedure: TONSILLECTOMY ;  Surgeon: Flo Shanks, MD;  Location: [facility] OR;  Service: ENT;  Laterality: Bilateral;  . TUMOR EXCISION     Hx: of right side of neck  . WISDOM TOOTH EXTRACTION     No Known Allergies No current facility-administered medications on file prior to encounter.   Current Outpatient Medications on File Prior to Encounter  Medication Sig Dispense Refill  . citalopram (CELEXA) 20 MG tablet Take 20 mg by mouth daily.    Marland Kitchen lisinopril-hydrochlorothiazide (ZESTORETIC) 20-12.5 MG tablet Take 1 tablet by mouth daily. 30 tablet 5  . norethindrone-ethinyl estradiol (LOESTRIN FE) 1-20 MG-MCG tablet Take 1 tablet by mouth daily. 1 Package 11  . Oxymetazoline HCl (NASAL SPRAY) 0.05 % SOLN Place 1-2 sprays into the nose daily as needed (for congestion).     Social History   Socioeconomic History  . Marital status: Single    Spouse name: Not on file  . Number of children: Not on file  . Years of education: Not on file  . Highest education level: Not on file  Occupational History  . 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 Smoking status: Former Smoker    Years: 1.00  Types: Cigarettes    Quit date: 10/22/2016    Years since quitting: 3.7  . Smokeless tobacco: Never Used  . Tobacco comment: smoked only 1-2 cig weekly for 1 year  Vaping Use  . Vaping Use: Never used  Substance and Sexual Activity  . Alcohol use: Yes    Alcohol/week: 0.0 standard drinks    Comment: occasional  . Drug use: Yes    Frequency: 7.0 times per week    Types: Marijuana    Comment: 3x/week  . Sexual activity: Yes    Birth control/protection: None, Pill  Other Topics Concern  . Not on file  Social History  Narrative  . Not on file   Social Determinants of Health   Financial Resource Strain: Not on file  Food Insecurity: Not on file  Transportation Needs: Not on file  Physical Activity: Not on file  Stress: Not on file  Social Connections: Not on file  Intimate Partner Violence: Not on file   Family History  Problem Relation Age of Onset  . Cancer - Other Mother   . Arthritis Mother   . Hypertension Father   . COPD Father   . Cancer - Cervical Other   . Diabetes Other   . Stroke Other     OBJECTIVE:  Vitals:   07/25/20 1100 07/25/20 1107  BP: (!) 148/93   Pulse: 80   Resp: 16   Temp: 99 F (37.2 C)   TempSrc: Oral   SpO2: 99%   Weight:  (!) 348 lb 5.2 oz (158 kg)  Height:  5\' 8"  (1.727 m)    General appearance: ALERT; in no acute distress.  Head: NCAT Lungs: Normal respiratory effort CV: pulses 2+ bilaterally. Cap refill < 2 seconds Musculoskeletal:  Inspection: Skin warm, dry, clear and intact without obvious erythema, effusion, or ecchymosis.  Palpation: Right low back tender to palpation, positive right straight leg raise ROM: Limited ROM active and passive with bending, twisting, changing positions Skin: warm and dry Neurologic: Ambulates without difficulty; Sensation intact about the upper/ lower extremities Psychological: alert and cooperative; normal mood and affect  DIAGNOSTIC STUDIES:  No results found.   ASSESSMENT & PLAN:  1. Acute right-sided low back pain with right-sided sciatica      Meds ordered this encounter  Medications  . ketorolac (TORADOL) 30 MG/ML injection 30 mg  . dexamethasone (DECADRON) injection 10 mg  . tiZANidine (ZANAFLEX) 4 MG tablet    Sig: Take 1 tablet (4 mg total) by mouth every 6 (six) hours as needed for muscle spasms.    Dispense:  30 tablet    Refill:  0    Order Specific Question:   Supervising Provider    Answer:   Merrilee Jansky  . meloxicam (MOBIC) 7.5 MG tablet    Sig: Take 1 tablet (7.5 mg  total) by mouth daily.    Dispense:  30 tablet    Refill:  0    Order Specific Question:   Supervising Provider    Answer:   [id number] Merrilee Jansky  . predniSONE (STERAPRED UNI-PAK 21 TAB) 10 MG (21) TBPK tablet    Sig: Take by mouth daily for 6 days. Take 6 tablets on day 1, 5 tablets on day 2, 4 tablets on day 3, 3 tablets on day 4, 2 tablets on day 5, 1 tablet on day 6    Dispense:  21 tablet    Refill:  0    Order Specific Question:  Supervising Provider    Answer:   Merrilee Jansky [[id number]]   Toradol 30 mg IM in office today Decadron 2 mg IM in office today Steroid taper prescribed Meloxicam prescribed Tizanidine prescribed Continue conservative management of rest, ice, and gentle stretches Take ibuprofen as needed for pain relief (may cause abdominal discomfort, ulcers, and GI bleeds avoid taking with other NSAIDs) Take tizanidine at nighttime for symptomatic relief. Avoid driving or operating heavy machinery while using medication. Follow up with PCP if symptoms persist Return or go to the ER if you have any new or worsening symptoms (fever, chills, chest pain, abdominal pain, changes in bowel or bladder habits, pain radiating into lower legs)  Reviewed expectations re: course of current medical issues. Questions answered. Outlined signs and symptoms indicating need for more acute intervention. Patient verbalized understanding. After Visit Summary given.       Moshe Cipro, NP 07/25/20 1204

## 2020-07-25 NOTE — Discharge Instructions (Signed)
You have received a steroid injection in the office today  You have also received an injection of Toradol for pain in the office today.  I have sent in meloxicam for you to take once daily for inflammation.  Do not take ibuprofen with this medication.  You may take Tylenol with this medication.  I have sent in tizanidine for you to take every 6 hours as needed for muscle spasms.  This medication can make you sleepy.  Do not drive or operate heavy machinery while you are taking this medication.  I have sent in a prednisone taper for you to take for 6 days. 6 tablets on day one, 5 tablets on day two, 4 tablets on day three, 3 tablets on day four, 2 tablets on day five, and 1 tablet on day six.  Follow up with this office or with primary care if symptoms are persisting.  Follow up in the ER for high fever, trouble swallowing, trouble breathing, other concerning symptoms.

## 2020-07-25 NOTE — ED Triage Notes (Signed)
Pain to RT low back that shoots down her leg.  Pt has hx of sciatic nerve pain and this feels the same.  Had to miss work today.  Taking tylenol with no relief.

## 2020-08-02 ENCOUNTER — Ambulatory Visit
Admission: EM | Admit: 2020-08-02 | Discharge: 2020-08-02 | Disposition: A | Discharge status: Home / Self Care | Payer: Self-pay | Attending: Emergency Medicine | Admitting: Emergency Medicine

## 2020-08-02 ENCOUNTER — Encounter: Payer: Self-pay | Admitting: Emergency Medicine

## 2020-08-02 ENCOUNTER — Other Ambulatory Visit: Payer: Self-pay

## 2020-08-02 DIAGNOSIS — M545 Low back pain, unspecified: Secondary | ICD-10-CM

## 2020-08-02 DIAGNOSIS — Z3201 Encounter for pregnancy test, result positive: Secondary | ICD-10-CM

## 2020-08-02 LAB — POCT URINE PREGNANCY: Preg Test, Ur: POSITIVE — AB

## 2020-08-02 NOTE — ED Triage Notes (Signed)
lower pelvic pain - seems like a large boil on her vaginal area. took 6 pregnancy tests all positive. LMP- 07/03/21. stopped birth control in Sept.

## 2020-08-02 NOTE — ED Provider Notes (Signed)
[facility]   [id number] 08/02/20 Arrival Time: 0904   Chief Complaint  Patient presents with  . Back Pain     SUBJECTIVE: History from: patient.  Jacqueline Alvarado is a 25 y.o. female who presented to the urgent care with a complaint of back pain that radiated to her pelvic for the past few days.  Was previously seen at the urgent care and was prescribed meloxicam, prednisone and trazodone with mild relief.  Patient states she has 6 positive home pregnancy test.  States she is sexually active with 1 female partner.  Does not use any contraceptive method.  Denies similar symptoms in the past.  Denies fever, chills, fatigue, sinus pain, rhinorrhea, sore throat, SOB, wheezing, chest pain, nausea, changes in bowel or bladder habits.     ROS: As per HPI.  All other pertinent ROS negative.     Past Medical History:  Diagnosis Date  . Abdominal pain, chronic, right upper quadrant   . Abnormal Pap smear of cervix   . Anemia    iron deficiency  . Anxiety   . BPD (bronchopulmonary dysplasia)   . Depression   . Headache(784.0)   . Papanicolaou smear of cervix with positive high risk human papilloma virus (HPV) test 05/31/2019   05/2019 repeat HPV in 1 year   . PONV (postoperative nausea and vomiting)    Hx: of nausea only at age 56   Past Surgical History:  Procedure Laterality Date  . CHOLECYSTECTOMY N/A 02/02/2017   Procedure: LAPAROSCOPIC CHOLECYSTECTOMY;  Surgeon: Franky Macho, MD;  Location: [facility] ORS;  Service: General;  Laterality: N/A;  . COLONOSCOPY N/A 01/07/2016   Dr. Darrick Penna: Nonbleeding internal hemorrhoids, large. 3 bands successfully placed. Distal ileum containing multiple ulcers which she felt was related to NSAIDs. Biopsies nonspecific.  Marland Kitchen HEMORRHOID BANDING N/A 01/07/2016   Procedure: HEMORRHOID BANDING;  Surgeon: West Bali, MD;  Location: [facility] ENDO SUITE;  Service: Endoscopy;  Laterality: N/A;  . MASS EXCISION N/A 11/26/2016   Procedure: EXCISION OF ABDOMINAL  CICATRIX 4CM;  Surgeon: Franky Macho, MD;  Location: [facility] ORS;  Service: General;  Laterality: N/A;  p knows to arrive at 7:50  . NASAL SEPTOPLASTY W/ TURBINOPLASTY Bilateral 06/24/2013   Procedure: TURBINATE REDUCTION;  Surgeon: Flo Shanks, MD;  Location: [facility] OR;  Service: ENT;  Laterality: Bilateral;  . TONSILLECTOMY AND ADENOIDECTOMY Bilateral 06/24/2013   Procedure: TONSILLECTOMY ;  Surgeon: Flo Shanks, MD;  Location: [facility] OR;  Service: ENT;  Laterality: Bilateral;  . TUMOR EXCISION     Hx: of right side of neck  . WISDOM TOOTH EXTRACTION     No Known Allergies No current facility-administered medications on file prior to encounter.   Current Outpatient Medications on File Prior to Encounter  Medication Sig Dispense Refill  . citalopram (CELEXA) 20 MG tablet Take 20 mg by mouth daily.    Marland Kitchen lisinopril-hydrochlorothiazide (ZESTORETIC) 20-12.5 MG tablet Take 1 tablet by mouth daily. 30 tablet 5  . meloxicam (MOBIC) 7.5 MG tablet Take 1 tablet (7.5 mg total) by mouth daily. 30 tablet 0  . norethindrone-ethinyl estradiol (LOESTRIN FE) 1-20 MG-MCG tablet Take 1 tablet by mouth daily. 1 Package 11  . Oxymetazoline HCl (NASAL SPRAY) 0.05 % SOLN Place 1-2 sprays into the nose daily as needed (for congestion).    Marland Kitchen tiZANidine (ZANAFLEX) 4 MG tablet Take 1 tablet (4 mg total) by mouth every 6 (six) hours as needed for muscle spasms. 30 tablet 0   Social History  Socioeconomic History  . Marital status: Single    Spouse name: Not on file  . Number of children: Not on file  . Years of education: Not on file  . Highest education level: Not on file  Occupational History  . 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 Smoking status: Former Smoker    Years: 1.00    Types: Cigarettes    Quit date: 10/22/2016    Years since quitting: 3.7  . Smokeless tobacco: Never Used  . Tobacco comment: smoked only 1-2 cig weekly for 1 year  Vaping Use  . Vaping Use: Never used   Substance and Sexual Activity  . Alcohol use: Yes    Alcohol/week: 0.0 standard drinks    Comment: occasional  . Drug use: Yes    Frequency: 7.0 times per week    Types: Marijuana    Comment: 3x/week  . Sexual activity: Yes    Birth control/protection: None, Pill  Other Topics Concern  . Not on file  Social History Narrative  . Not on file   Social Determinants of Health   Financial Resource Strain: Not on file  Food Insecurity: Not on file  Transportation Needs: Not on file  Physical Activity: Not on file  Stress: Not on file  Social Connections: Not on file  Intimate Partner Violence: Not on file   Family History  Problem Relation Age of Onset  . Cancer - Other Mother   . Arthritis Mother   . Hypertension Father   . COPD Father   . Cancer - Cervical Other   . Diabetes Other   . Stroke Other     OBJECTIVE:  Vitals:   08/02/20 0919  BP: 139/84  Pulse: 75  Resp: 15  Temp: 98.9 F (37.2 C)  SpO2: 98%     Physical Exam Vitals and nursing note reviewed.  Constitutional:      General: She is not in acute distress.    Appearance: Normal appearance. She is normal weight. She is not ill-appearing, toxic-appearing or diaphoretic.  HENT:     Head: Normocephalic.  Cardiovascular:     Rate and Rhythm: Normal rate and regular rhythm.     Pulses: Normal pulses.     Heart sounds: Normal heart sounds. No murmur heard. No friction rub. No gallop.   Pulmonary:     Effort: Pulmonary effort is normal. No respiratory distress.     Breath sounds: Normal breath sounds. No stridor. No wheezing, rhonchi or rales.  Chest:     Chest wall: No tenderness.  Musculoskeletal:        General: Tenderness present.     Lumbar back: Tenderness present.     Comments: Back:  Patient ambulates from chair to exam table without difficulty.  Inspection: Skin clear and intact without obvious swelling, erythema, or ecchymosis. Warm to the touch  Palpation: Vertebral processes nontender.  Tenderness about the lowerparavertebral muscles  ROM: FROM Strength: 5/5 hip flexion, 5/5 knee extension, 5/5 knee flexion, 5/5 plantar flexion, 5/5 dorsiflexion    Neurological:     Mental Status: She is alert and oriented to person, place, and time.     LABS:  Results for orders placed or performed during the hospital encounter of 08/02/20 (from the past 24 hour(s))  POCT urine pregnancy     Status: Abnormal   Collection Time: 08/02/20  9:35 AM  Result Value Ref Range   Preg Test, Ur Positive (A) Negative  ASSESSMENT & PLAN:  1. Positive pregnancy test   2. Acute low back pain without sciatica, unspecified back pain laterality     No orders of the defined types were placed in this encounter.   Discharge instructions  Take OTC Tylenol as needed for pain Stop taking meloxicam, trazodone and prednisone that was previously prescribed Pregnancy medication resource was given Follow-up with OB/GYN ASAP Return or go to ED if you develop any new or worsening of your symptoms  Reviewed expectations re: course of current medical issues. Questions answered. Outlined signs and symptoms indicating need for more acute intervention. Patient verbalized understanding. After Visit Summary given.         Durward Parcel, FNP 08/02/20 [phone]

## 2020-08-02 NOTE — Discharge Instructions (Addendum)
Take OTC Tylenol as needed for pain Stop taking meloxicam, trazodone and prednisone that was previously prescribed Pregnancy medication resource was given Follow-up with OB/GYN ASAP Return or go to ED if you develop any new or worsening of your symptoms

## 2020-08-23 ENCOUNTER — Other Ambulatory Visit: Payer: Self-pay | Admitting: Obstetrics & Gynecology

## 2020-08-23 DIAGNOSIS — O3680X0 Pregnancy with inconclusive fetal viability, not applicable or unspecified: Secondary | ICD-10-CM

## 2020-08-27 ENCOUNTER — Other Ambulatory Visit: Payer: Self-pay

## 2020-10-15 ENCOUNTER — Other Ambulatory Visit: Payer: Self-pay | Admitting: Adult Health

## 2020-11-07 ENCOUNTER — Encounter: Payer: Self-pay | Admitting: Advanced Practice Midwife

## 2020-11-07 ENCOUNTER — Other Ambulatory Visit: Payer: Self-pay | Admitting: *Deleted

## 2020-11-07 ENCOUNTER — Ambulatory Visit: Payer: Self-pay | Admitting: Advanced Practice Midwife

## 2020-11-07 ENCOUNTER — Other Ambulatory Visit: Payer: Self-pay

## 2020-11-07 ENCOUNTER — Other Ambulatory Visit (HOSPITAL_COMMUNITY)
Admission: RE | Admit: 2020-11-07 | Discharge: 2020-11-07 | Disposition: A | Discharge status: Home / Self Care | Payer: Self-pay | Source: Ambulatory Visit | Attending: Advanced Practice Midwife | Admitting: Advanced Practice Midwife

## 2020-11-07 VITALS — BP 141/83 | HR 76 | Ht 68.0 in | Wt 347.0 lb

## 2020-11-07 DIAGNOSIS — Z01419 Encounter for gynecological examination (general) (routine) without abnormal findings: Secondary | ICD-10-CM

## 2020-11-07 DIAGNOSIS — L739 Follicular disorder, unspecified: Secondary | ICD-10-CM

## 2020-11-07 DIAGNOSIS — Z8489 Family history of other specified conditions: Secondary | ICD-10-CM

## 2020-11-07 DIAGNOSIS — I10 Essential (primary) hypertension: Secondary | ICD-10-CM

## 2020-11-07 MED ORDER — NORETHINDRONE 0.35 MG PO TABS: 1.0000 | ORAL_TABLET | Freq: Every day | ORAL | 11 refills | Status: DC

## 2020-11-07 MED ORDER — CEPHALEXIN 500 MG PO CAPS: 500.0000 mg | ORAL_CAPSULE | Freq: Four times a day (QID) | ORAL | 0 refills | Status: AC

## 2020-11-07 MED ORDER — LISINOPRIL-HYDROCHLOROTHIAZIDE 20-12.5 MG PO TABS: 1.0000 | ORAL_TABLET | Freq: Every day | ORAL | 5 refills | Status: DC

## 2020-11-07 MED ORDER — CITALOPRAM HYDROBROMIDE 20 MG PO TABS: 20.0000 mg | ORAL_TABLET | Freq: Every day | ORAL | 6 refills | Status: DC

## 2020-11-07 NOTE — Progress Notes (Signed)
WELL-WOMAN EXAMINATION Patient name: Jacqueline Alvarado MRN [id number]  Date of birth: 07/02/1996 Chief Complaint:   Gynecologic Exam  History of Present Illness:   Jacqueline Alvarado is a 25 y.o. [id number] African American female being seen today for a routine well-woman exam.  Current complaints: had TAB in Feb 2022- appropriately emotional; had some weight gain with the stress after losing approx 15-20lbs prior; hasn't been taking BP meds regularly, and hasn't started back on OCPs yet but is wanting contraception; also with hx of folliculitis on mons and now has some lesions  Depression screen [facility] 2/9 11/07/2020 05/24/2019 05/24/2019  Decreased Interest 0 2 2  Down, Depressed, Hopeless 1 2 2   PHQ - 2 Score 1 4 4   Altered sleeping 3 3 -  Tired, decreased energy 3 2 -  Change in appetite 3 3 -  Feeling bad or failure about yourself  1 2 -  Trouble concentrating 2 1 -  Moving slowly or fidgety/restless 1 2 -  Suicidal thoughts 1 0 -  PHQ-9 Score 15 17 -     PCP: Dr      does not desire labs Patient's last menstrual period was 11/02/2020 (exact date). The current method of family planning is none.  Last pap Nov 2020. Results were: NILM w/ HRHPV positive. H/O abnormal pap: yes Last mammogram: never. Family h/o breast cancer: yes , mother survivor x 2, first dx in early 36s Last colonoscopy: never. Family h/o colorectal cancer: no Review of Systems:   Pertinent items are noted in HPI Denies any headaches, blurred vision, fatigue, shortness of breath, chest pain, abdominal pain, abnormal vaginal discharge/itching/odor/irritation, problems with periods, bowel movements, urination, or intercourse unless otherwise stated above. Pertinent History Reviewed:  Reviewed past medical,surgical, social and family history.  Reviewed problem list, medications and allergies. Physical Assessment:   Vitals:   11/07/20 1333  BP: (!) 141/83  Pulse: 76  Weight: (!) 347 lb (157.4 kg)  Height: 5\' 8"   (1.727 m)  Body mass index is 52.76 kg/m.        Physical Examination:   General appearance - well appearing, and in no distress  Mental status - alert, oriented to person, place, and time  Psych:  She has a normal mood and affect  Skin - warm and dry, normal color, no suspicious lesions noted  Chest - effort normal, all lung fields clear to auscultation bilaterally  Heart - normal rate and regular rhythm  Neck:  midline trachea, no thyromegaly or nodules  Breasts - breasts appear normal, no suspicious masses, no skin or nipple changes or  axillary nodes  Abdomen - soft, nontender, nondistended, no masses or organomegaly  Pelvic - VULVA: normal appearing vulva with no masses, tenderness or lesions; on mons> 3 sm lesions, one ruptured and possibly infected  VAGINA: normal appearing vagina with normal color and discharge, no lesions  CERVIX: normal appearing cervix without discharge or lesions, no CMT  Thin prep pap is done with HR HPV cotesting  UTERUS: uterus is felt to be normal size, shape, consistency and nontender   ADNEXA: No adnexal masses or tenderness noted.  Extremities:  No swelling or varicosities noted  Chaperone: Angel Neas    No results found for this or any previous visit (from the past 24 hour(s)).  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 MM  Digital Screening  . MS DIGITAL SCREENING TOMO BILATERAL    Meds:  Meds ordered this encounter  Medications  . cephALEXin (KEFLEX) 500 MG capsule    Sig: Take 1 capsule (500 mg total) by mouth 4 (four) times daily for 7 days.    Dispense:  28 capsule    Refill:  0    Order Specific Question:   Supervising Provider    Answer:   EURE, LUTHER H [[id number]]  . norethindrone (MICRONOR) 0.35 MG tablet    Sig: Take 1 tablet (0.35 mg total) by mouth daily.    Dispense:  28 tablet    Refill:  11    Order Specific Question:   Supervising Provider    Answer:   Despina Hidden, LUTHER H [[id number]]  . citalopram (CELEXA) 20 MG tablet    Sig: Take 1 tablet (20 mg total) by mouth daily.    Dispense:  30 tablet    Refill:  6    Order Specific Question:   Supervising Provider    Answer:   Lazaro Arms [[id number]]    Follow-up: Return in about 1 year (around 11/07/2021) for Physical.  Jacqueline Alvarado [facility] 11/07/2020 5:33 PM

## 2020-11-09 ENCOUNTER — Other Ambulatory Visit: Payer: Self-pay

## 2020-11-09 MED ORDER — LISINOPRIL-HYDROCHLOROTHIAZIDE 20-12.5 MG PO TABS: 1.0000 | ORAL_TABLET | Freq: Every day | ORAL | 5 refills | Status: DC

## 2020-11-13 LAB — CYTOLOGY - PAP
Chlamydia: NEGATIVE
Comment: NEGATIVE
Comment: NEGATIVE
Comment: NEGATIVE
Comment: NORMAL
HPV 16: NEGATIVE
HPV 18 / 45: NEGATIVE
High risk HPV: POSITIVE — AB
Neisseria Gonorrhea: NEGATIVE

## 2020-11-27 ENCOUNTER — Encounter: Payer: Self-pay | Admitting: Obstetrics & Gynecology

## 2020-12-19 ENCOUNTER — Encounter: Payer: Self-pay | Admitting: Women's Health

## 2021-01-17 ENCOUNTER — Encounter: Payer: Self-pay | Admitting: Women's Health

## 2021-01-31 ENCOUNTER — Other Ambulatory Visit: Payer: Self-pay

## 2021-01-31 ENCOUNTER — Encounter: Payer: Self-pay | Admitting: Emergency Medicine

## 2021-01-31 ENCOUNTER — Ambulatory Visit
Admission: EM | Admit: 2021-01-31 | Discharge: 2021-01-31 | Disposition: A | Discharge status: Home / Self Care | Payer: BC Managed Care – PPO | Attending: Emergency Medicine | Admitting: Emergency Medicine

## 2021-01-31 DIAGNOSIS — M25521 Pain in right elbow: Secondary | ICD-10-CM

## 2021-01-31 DIAGNOSIS — R2231 Localized swelling, mass and lump, right upper limb: Secondary | ICD-10-CM

## 2021-01-31 DIAGNOSIS — M25561 Pain in right knee: Secondary | ICD-10-CM

## 2021-01-31 MED ORDER — MELOXICAM 15 MG PO TABS: 15.0000 mg | ORAL_TABLET | Freq: Every day | ORAL | 0 refills | Status: DC

## 2021-01-31 NOTE — ED Triage Notes (Addendum)
RT elbow pain  x 6 days ,  RT knee pain x 2 weeks ago and RT index finger pain x 1 month.  Denies any injury.  Pain is on and off since pt had a fall around a year ago.

## 2021-01-31 NOTE — Discharge Instructions (Addendum)
Continue conservative management of rest, ice, and gentle stretches Ace applied Take mobic as needed for pain relief (may cause abdominal discomfort, ulcers, and GI bleeds avoid taking with other NSAIDs or celexa) Follow up with PCP for possible blood work for rheumatoid arthritis work-up, also for nodule on finger.  Follow up with orthopedist for further evaluation and ongoing RT elbow and knee pain Return or go to the ER if you have any new or worsening symptoms (fever, chills, chest pain, redness, swelling, drainage, deformity, etc...)
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
## 2021-07-17 ENCOUNTER — Other Ambulatory Visit (HOSPITAL_COMMUNITY): Payer: Self-pay | Admitting: Nurse Practitioner

## 2021-07-17 DIAGNOSIS — M25521 Pain in right elbow: Secondary | ICD-10-CM

## 2021-07-17 DIAGNOSIS — M542 Cervicalgia: Secondary | ICD-10-CM

## 2021-07-17 DIAGNOSIS — M79642 Pain in left hand: Secondary | ICD-10-CM

## 2021-07-17 DIAGNOSIS — M545 Low back pain, unspecified: Secondary | ICD-10-CM

## 2021-07-17 DIAGNOSIS — M25561 Pain in right knee: Secondary | ICD-10-CM

## 2021-07-17 DIAGNOSIS — M79641 Pain in right hand: Secondary | ICD-10-CM

## 2021-07-22 ENCOUNTER — Ambulatory Visit (HOSPITAL_COMMUNITY)
Admission: RE | Admit: 2021-07-22 | Discharge: 2021-07-22 | Disposition: A | Discharge status: Home / Self Care | Payer: BC Managed Care – PPO | Source: Ambulatory Visit | Attending: Nurse Practitioner | Admitting: Nurse Practitioner

## 2021-07-22 ENCOUNTER — Other Ambulatory Visit: Payer: Self-pay

## 2021-07-22 DIAGNOSIS — M25561 Pain in right knee: Secondary | ICD-10-CM | POA: Diagnosis present

## 2021-07-22 DIAGNOSIS — M545 Low back pain, unspecified: Secondary | ICD-10-CM | POA: Insufficient documentation

## 2021-07-22 DIAGNOSIS — M79642 Pain in left hand: Secondary | ICD-10-CM | POA: Insufficient documentation

## 2021-07-22 DIAGNOSIS — M79641 Pain in right hand: Secondary | ICD-10-CM | POA: Insufficient documentation

## 2021-07-22 DIAGNOSIS — M542 Cervicalgia: Secondary | ICD-10-CM | POA: Diagnosis present

## 2021-07-22 DIAGNOSIS — M25521 Pain in right elbow: Secondary | ICD-10-CM | POA: Diagnosis present

## 2021-07-25 ENCOUNTER — Encounter: Payer: Self-pay | Admitting: Internal Medicine

## 2021-08-28 ENCOUNTER — Ambulatory Visit: Payer: BC Managed Care – PPO | Admitting: Internal Medicine

## 2021-08-28 ENCOUNTER — Encounter: Payer: Self-pay | Admitting: Internal Medicine

## 2021-09-06 ENCOUNTER — Inpatient Hospital Stay (HOSPITAL_COMMUNITY)
Admission: EM | Admit: 2021-09-06 | Discharge: 2021-09-08 | DRG: 872 | Disposition: A | Discharge status: Home / Self Care | Payer: Self-pay | Attending: Family Medicine | Admitting: Family Medicine

## 2021-09-06 ENCOUNTER — Other Ambulatory Visit: Payer: Self-pay

## 2021-09-06 ENCOUNTER — Emergency Department (HOSPITAL_COMMUNITY): Payer: Self-pay

## 2021-09-06 ENCOUNTER — Encounter (HOSPITAL_COMMUNITY): Payer: Self-pay

## 2021-09-06 ENCOUNTER — Ambulatory Visit
Admission: EM | Admit: 2021-09-06 | Discharge: 2021-09-06 | Disposition: A | Discharge status: Home / Self Care | Payer: Self-pay | Attending: Family Medicine | Admitting: Family Medicine

## 2021-09-06 DIAGNOSIS — Z808 Family history of malignant neoplasm of other organs or systems: Secondary | ICD-10-CM

## 2021-09-06 DIAGNOSIS — Z825 Family history of asthma and other chronic lower respiratory diseases: Secondary | ICD-10-CM

## 2021-09-06 DIAGNOSIS — Z20822 Contact with and (suspected) exposure to covid-19: Secondary | ICD-10-CM | POA: Diagnosis present

## 2021-09-06 DIAGNOSIS — Z8249 Family history of ischemic heart disease and other diseases of the circulatory system: Secondary | ICD-10-CM

## 2021-09-06 DIAGNOSIS — K760 Fatty (change of) liver, not elsewhere classified: Secondary | ICD-10-CM | POA: Diagnosis present

## 2021-09-06 DIAGNOSIS — Z87891 Personal history of nicotine dependence: Secondary | ICD-10-CM

## 2021-09-06 DIAGNOSIS — Z6841 Body Mass Index (BMI) 40.0 and over, adult: Secondary | ICD-10-CM

## 2021-09-06 DIAGNOSIS — I10 Essential (primary) hypertension: Secondary | ICD-10-CM | POA: Diagnosis present

## 2021-09-06 DIAGNOSIS — L0291 Cutaneous abscess, unspecified: Secondary | ICD-10-CM

## 2021-09-06 DIAGNOSIS — R509 Fever, unspecified: Secondary | ICD-10-CM

## 2021-09-06 DIAGNOSIS — Z803 Family history of malignant neoplasm of breast: Secondary | ICD-10-CM

## 2021-09-06 DIAGNOSIS — G4733 Obstructive sleep apnea (adult) (pediatric): Secondary | ICD-10-CM | POA: Diagnosis present

## 2021-09-06 DIAGNOSIS — R7989 Other specified abnormal findings of blood chemistry: Secondary | ICD-10-CM | POA: Diagnosis present

## 2021-09-06 DIAGNOSIS — Z79899 Other long term (current) drug therapy: Secondary | ICD-10-CM

## 2021-09-06 DIAGNOSIS — Z8261 Family history of arthritis: Secondary | ICD-10-CM

## 2021-09-06 DIAGNOSIS — Z7952 Long term (current) use of systemic steroids: Secondary | ICD-10-CM

## 2021-09-06 DIAGNOSIS — A419 Sepsis, unspecified organism: Principal | ICD-10-CM | POA: Diagnosis present

## 2021-09-06 DIAGNOSIS — L03115 Cellulitis of right lower limb: Secondary | ICD-10-CM | POA: Diagnosis present

## 2021-09-06 LAB — URINALYSIS, ROUTINE W REFLEX MICROSCOPIC
Bilirubin Urine: NEGATIVE
Glucose, UA: NEGATIVE mg/dL
Hgb urine dipstick: NEGATIVE
Ketones, ur: NEGATIVE mg/dL
Leukocytes,Ua: NEGATIVE
Nitrite: NEGATIVE
Protein, ur: NEGATIVE mg/dL
Specific Gravity, Urine: <1.005 — ABNORMAL LOW (ref 1.005–1.030)
pH: 6.5 (ref 5.0–8.0)

## 2021-09-06 LAB — PROTIME-INR
INR: 1.1 (ref 0.8–1.2)
Prothrombin Time: 14.5 seconds (ref 11.4–15.2)

## 2021-09-06 LAB — CBC WITH DIFFERENTIAL/PLATELET
Abs Immature Granulocytes: 0.15 10*3/uL — ABNORMAL HIGH (ref 0.00–0.07)
Basophils Absolute: 0.1 10*3/uL (ref 0.0–0.1)
Basophils Relative: 0 %
Eosinophils Absolute: 0.7 10*3/uL — ABNORMAL HIGH (ref 0.0–0.5)
Eosinophils Relative: 4 %
HCT: 33.9 % — ABNORMAL LOW (ref 36.0–46.0)
Hemoglobin: 10.7 g/dL — ABNORMAL LOW (ref 12.0–15.0)
Immature Granulocytes: 1 %
Lymphocytes Relative: 4 %
Lymphs Abs: 0.8 10*3/uL (ref 0.7–4.0)
MCH: 22.7 pg — ABNORMAL LOW (ref 26.0–34.0)
MCHC: 31.6 g/dL (ref 30.0–36.0)
MCV: 71.8 fL — ABNORMAL LOW (ref 80.0–100.0)
Monocytes Absolute: 1.1 10*3/uL — ABNORMAL HIGH (ref 0.1–1.0)
Monocytes Relative: 5 %
Neutro Abs: 16.7 10*3/uL — ABNORMAL HIGH (ref 1.7–7.7)
Neutrophils Relative %: 86 %
Platelets: 252 10*3/uL (ref 150–400)
RBC: 4.72 MIL/uL (ref 3.87–5.11)
RDW: 15.9 % — ABNORMAL HIGH (ref 11.5–15.5)
WBC: 19.5 10*3/uL — ABNORMAL HIGH (ref 4.0–10.5)
nRBC: 0.1 % (ref 0.0–0.2)

## 2021-09-06 LAB — HEPATITIS PANEL, ACUTE
HCV Ab: NONREACTIVE
Hep A IgM: NONREACTIVE
Hep B C IgM: NONREACTIVE
Hepatitis B Surface Ag: NONREACTIVE

## 2021-09-06 LAB — COMPREHENSIVE METABOLIC PANEL
ALT: 61 U/L — ABNORMAL HIGH (ref 0–44)
AST: 37 U/L (ref 15–41)
Albumin: 3.1 g/dL — ABNORMAL LOW (ref 3.5–5.0)
Alkaline Phosphatase: 41 U/L (ref 38–126)
Anion gap: 8 (ref 5–15)
BUN: 9 mg/dL (ref 6–20)
CO2: 22 mmol/L (ref 22–32)
Calcium: 8.3 mg/dL — ABNORMAL LOW (ref 8.9–10.3)
Chloride: 102 mmol/L (ref 98–111)
Creatinine, Ser: 0.88 mg/dL (ref 0.44–1.00)
GFR, Estimated: >60 mL/min (ref >60)
Glucose, Bld: 120 mg/dL — ABNORMAL HIGH (ref 70–99)
Potassium: 3.1 mmol/L — ABNORMAL LOW (ref 3.5–5.1)
Sodium: 132 mmol/L — ABNORMAL LOW (ref 135–145)
Total Bilirubin: 0.6 mg/dL (ref 0.3–1.2)
Total Protein: 6.9 g/dL (ref 6.5–8.1)

## 2021-09-06 LAB — RESP PANEL BY RT-PCR (FLU A&B, COVID) ARPGX2
Influenza A by PCR: NEGATIVE
Influenza B by PCR: NEGATIVE
SARS Coronavirus 2 by RT PCR: NEGATIVE

## 2021-09-06 LAB — I-STAT BETA HCG BLOOD, ED (MC, WL, AP ONLY): I-stat hCG, quantitative: <5 m[IU]/mL (ref <5)

## 2021-09-06 LAB — MRSA NEXT GEN BY PCR, NASAL: MRSA by PCR Next Gen: NOT DETECTED

## 2021-09-06 LAB — HIV ANTIBODY (ROUTINE TESTING W REFLEX): HIV Screen 4th Generation wRfx: NONREACTIVE

## 2021-09-06 LAB — LACTIC ACID, PLASMA: Lactic Acid, Venous: 1.3 mmol/L (ref 0.5–1.9)

## 2021-09-06 MED ORDER — SODIUM CHLORIDE 0.9 % IV SOLN
2.0000 g | Freq: Three times a day (TID) | INTRAVENOUS | Status: DC
  Administered 2021-09-06 – 2021-09-08 (×5): 2 g via INTRAVENOUS
  Filled 2021-09-06 (×5): qty 2

## 2021-09-06 MED ORDER — VANCOMYCIN HCL IN DEXTROSE 1-5 GM/200ML-% IV SOLN
1000.0000 mg | Freq: Once | INTRAVENOUS | Status: AC
  Administered 2021-09-06: 1000 mg via INTRAVENOUS
  Filled 2021-09-06: qty 200

## 2021-09-06 MED ORDER — LACTATED RINGERS IV SOLN: INTRAVENOUS | Status: DC

## 2021-09-06 MED ORDER — MORPHINE SULFATE (PF) 4 MG/ML IV SOLN
INTRAVENOUS | Status: AC
Start: 2021-09-06 — End: 2021-09-07
  Filled 2021-09-06: qty 1

## 2021-09-06 MED ORDER — ACETAMINOPHEN 650 MG RE SUPP: 650.0000 mg | Freq: Four times a day (QID) | RECTAL | Status: DC | PRN

## 2021-09-06 MED ORDER — ONDANSETRON HCL 4 MG/2ML IJ SOLN
4.0000 mg | Freq: Once | INTRAMUSCULAR | Status: AC
  Administered 2021-09-06: 4 mg via INTRAVENOUS
  Filled 2021-09-06: qty 2

## 2021-09-06 MED ORDER — MORPHINE SULFATE (PF) 4 MG/ML IV SOLN
4.0000 mg | Freq: Once | INTRAVENOUS | Status: AC
  Administered 2021-09-06: 4 mg via INTRAVENOUS
  Filled 2021-09-06: qty 1

## 2021-09-06 MED ORDER — OXYCODONE HCL 5 MG PO TABS
5.0000 mg | ORAL_TABLET | ORAL | Status: DC | PRN
  Administered 2021-09-06 – 2021-09-08 (×4): 5 mg via ORAL
  Filled 2021-09-06 (×4): qty 1

## 2021-09-06 MED ORDER — IBUPROFEN 800 MG PO TABS
800.0000 mg | ORAL_TABLET | Freq: Once | ORAL | Status: AC
  Administered 2021-09-06: 800 mg via ORAL

## 2021-09-06 MED ORDER — ACETAMINOPHEN 325 MG PO TABS
650.0000 mg | ORAL_TABLET | Freq: Four times a day (QID) | ORAL | Status: DC | PRN
  Administered 2021-09-07: 650 mg via ORAL
  Filled 2021-09-06: qty 2

## 2021-09-06 MED ORDER — LISINOPRIL 10 MG PO TABS
10.0000 mg | ORAL_TABLET | Freq: Every day | ORAL | Status: DC
  Administered 2021-09-06 – 2021-09-08 (×2): 10 mg via ORAL
  Filled 2021-09-06 (×3): qty 1

## 2021-09-06 MED ORDER — ENOXAPARIN SODIUM 80 MG/0.8ML IJ SOSY
70.0000 mg | PREFILLED_SYRINGE | INTRAMUSCULAR | Status: DC
  Administered 2021-09-06 – 2021-09-07 (×2): 70 mg via SUBCUTANEOUS
  Filled 2021-09-06 (×2): qty 0.8

## 2021-09-06 MED ORDER — IOHEXOL 300 MG/ML  SOLN
100.0000 mL | Freq: Once | INTRAMUSCULAR | Status: AC | PRN
  Administered 2021-09-06: 100 mL via INTRAVENOUS

## 2021-09-06 MED ORDER — VANCOMYCIN HCL 1500 MG/300ML IV SOLN
1500.0000 mg | Freq: Two times a day (BID) | INTRAVENOUS | Status: DC
  Administered 2021-09-07 – 2021-09-08 (×3): 1500 mg via INTRAVENOUS
  Filled 2021-09-06 (×3): qty 300

## 2021-09-06 MED ORDER — ACETAMINOPHEN 500 MG PO TABS
1000.0000 mg | ORAL_TABLET | Freq: Once | ORAL | Status: AC
  Administered 2021-09-06: 1000 mg via ORAL
  Filled 2021-09-06: qty 2

## 2021-09-06 MED ORDER — NORETHINDRONE 0.35 MG PO TABS: 1.0000 | ORAL_TABLET | Freq: Every day | ORAL | Status: DC

## 2021-09-06 MED ORDER — VANCOMYCIN HCL 1500 MG/300ML IV SOLN
1500.0000 mg | Freq: Once | INTRAVENOUS | Status: AC
  Administered 2021-09-06: 1500 mg via INTRAVENOUS
  Filled 2021-09-06: qty 300

## 2021-09-06 MED ORDER — MORPHINE SULFATE (PF) 4 MG/ML IV SOLN
4.0000 mg | Freq: Once | INTRAVENOUS | Status: AC
Start: 2021-09-06 — End: 2021-09-06
  Administered 2021-09-06: 4 mg via INTRAVENOUS

## 2021-09-06 MED ORDER — ONDANSETRON HCL 4 MG/2ML IJ SOLN: 4.0000 mg | Freq: Four times a day (QID) | INTRAMUSCULAR | Status: DC | PRN

## 2021-09-06 MED ORDER — MELATONIN 3 MG PO TABS
6.0000 mg | ORAL_TABLET | Freq: Once | ORAL | Status: AC
  Administered 2021-09-06: 6 mg via ORAL
  Filled 2021-09-06: qty 2

## 2021-09-06 MED ORDER — SODIUM CHLORIDE 0.9 % IV BOLUS
1000.0000 mL | Freq: Once | INTRAVENOUS | Status: AC
  Administered 2021-09-06: 1000 mL via INTRAVENOUS

## 2021-09-06 MED ORDER — SODIUM CHLORIDE 0.9 % IV SOLN
2.0000 g | Freq: Once | INTRAVENOUS | Status: AC
  Administered 2021-09-06: 2 g via INTRAVENOUS
  Filled 2021-09-06: qty 2

## 2021-09-06 MED ORDER — ONDANSETRON HCL 4 MG PO TABS: 4.0000 mg | ORAL_TABLET | Freq: Four times a day (QID) | ORAL | Status: DC | PRN

## 2021-09-06 MED ORDER — BISACODYL 5 MG PO TBEC
5.0000 mg | DELAYED_RELEASE_TABLET | Freq: Every day | ORAL | Status: DC | PRN
  Administered 2021-09-08: 5 mg via ORAL
  Filled 2021-09-06: qty 1

## 2021-09-06 NOTE — Progress Notes (Signed)
Pharmacy Antibiotic Note  Jacqueline Alvarado is a 26 y.o. obese female admitted on 09/06/2021 with worsening R thigh cellulitis s/p outpatient Bactrim. Pharmacy has been consulted for vancomycin and cefepime dosing. Will redose vancomycin now to ensure adequate AUC achieved.   Plan: Vancomycin 1500mg  IV q 12h Cefepime 2gm IV Q8 hrs Monitor renal function, cultures, and symptom improvement  Height: 5\' 8"  (172.7 cm) Weight: (!) 151 kg (333 lb) IBW/kg (Calculated) : 63.9 Calculated AUC = 468. Vd = 0.5  Temp (24hrs), Avg:99.4 F (37.4 C), Min:97.9 F (36.6 C), Max:101.5 F (38.6 C)  Recent Labs  Lab 09/06/21 1220  WBC 19.5*  CREATININE 0.88  LATICACIDVEN 1.3    Estimated Creatinine Clearance: 149.6 mL/min (by C-G formula based on SCr of 0.88 mg/dL).    No Known Allergies  Antimicrobials this admission: 2/24 Vanc >> 2/24 Cefepime >>   Dose adjustments this admission:  Microbiology results: 2/24 BCx: >> 2/24 MRSA PCR: Negative  Thank you for allowing pharmacy to be a part of this patients care.  3/24, PharmD Clinical Pharmacist 09/06/2021 6:17 PM

## 2021-09-06 NOTE — ED Triage Notes (Signed)
Pt to er, pt states that she had some boils in her groin, states that she was seen by her doctor and given and abx, states that she was taking the abx for about 4 days and then started getting a fever, states that they checked her and she was flu and covid negative, states that she now has a larger area of redness in her R groin, reports general body aches.

## 2021-09-06 NOTE — ED Provider Notes (Signed)
[facility]ENCY DEPARTMENT Provider Note   CSN: [id number] Arrival date & time: 09/06/21  1130     History  Chief Complaint  Patient presents with   Abscess    Carrell Eudelia BunchS Granieri is a 26 y.o. female.  Yi Eudelia BunchS Tomkinson is a 26 y.o. female with hx of HTN, obesity, anxiety and depression, who presents to the ED for worsening cellulitis.  Patient reports that she was prescribed antibiotics for possible abscess or cellulitis in her right groin 4 days ago, has been taking Bactrim as prescribed but despite this symptoms have continued to worsen and she has now developed fevers and nausea.  Patient reports about 4 weeks ago she noticed some pimples along her right groin line, was on a course of antibiotics and these seem to resolve except for one but then over the past few weeks that 1 area has gotten worse and she now has redness that has extended down her mid thigh it is very swollen and tender to palpation and painful to walk.  She has not had any drainage from the area.  No pain or swelling over the labia.  With associated fevers patient has had negative COVID and flu testing reports that despite taking this her symptoms continue to worsen and now pain is radiating down her right leg and she also has generalized body aches.  Reports she has had some abscesses previously, no history of diabetes, has never had an episode this severe before.  The history is provided by the patient and medical records.      Home Medications Prior to Admission medications   Medication Sig Start Date End Date Taking? Authorizing Provider  lisinopril (ZESTRIL) 10 MG tablet Take 10 mg by mouth daily. 07/18/21  Yes [provider]  predniSONE (DELTASONE) 10 MG tablet Take 10 mg by mouth daily with breakfast.   Yes [provider]  sulfamethoxazole-trimethoprim (BACTRIM DS) 800-160 MG tablet Take 1 tablet by mouth 2 (two) times daily. 08/30/21  Yes [provider]  citalopram (CELEXA) 20 MG  tablet Take 1 tablet (20 mg total) by mouth daily. Patient not taking: Reported on 09/06/2021 11/07/20   Arabella MerlesShaw, Kimberly D, CNM  LARIN FE 1/20 1-20 MG-MCG tablet Take 1 tablet by mouth once daily Patient not taking: Reported on 11/07/2020 10/15/20   Adline PotterGriffin, Jennifer A, NP  lisinopril-hydrochlorothiazide (ZESTORETIC) 20-12.5 MG tablet Take 1 tablet by mouth daily. Patient not taking: Reported on 09/06/2021 11/09/20   Adline PotterGriffin, Jennifer A, NP  meloxicam (MOBIC) 15 MG tablet Take 1 tablet (15 mg total) by mouth daily. Patient not taking: Reported on 09/06/2021 01/31/21   Wurst, [place], PA-C  norethindrone (MICRONOR) 0.35 MG tablet Take 1 tablet (0.35 mg total) by mouth daily. Patient not taking: Reported on 09/06/2021 11/07/20   Arabella MerlesShaw, Kimberly D, CNM      Allergies    Patient has no known allergies.    Review of Systems   Review of Systems  Constitutional:  Positive for chills, fatigue and fever.  HENT: Negative.    Respiratory:  Negative for cough and shortness of breath.   Cardiovascular:  Negative for chest pain.  Gastrointestinal:  Positive for nausea. Negative for abdominal pain and vomiting.  Genitourinary:  Positive for pelvic pain. Negative for dysuria and frequency.  Musculoskeletal:  Positive for myalgias.  Skin:  Positive for color change. Negative for wound.  Neurological:  Negative for weakness and numbness.   Physical Exam Updated Vital Signs BP (!) 152/81 (BP Location: Right  Arm)    Pulse (!) 122    Temp (!) 101.5 F (38.6 C) (Oral)    Resp 18    Ht 5\' 8"  (1.727 m)    Wt (!) 151 kg    LMP 08/14/2021 (Exact Date)    SpO2 100%    BMI 50.63 kg/m  Physical Exam Vitals and nursing note reviewed.  Constitutional:      General: She is not in acute distress.    Appearance: Normal appearance. She is well-developed. She is obese. She is not ill-appearing or diaphoretic.  HENT:     Head: Normocephalic and atraumatic.  Eyes:     General:        Right eye: No discharge.        Left  eye: No discharge.  Cardiovascular:     Rate and Rhythm: Normal rate and regular rhythm.     Pulses: Normal pulses.     Heart sounds: Normal heart sounds.  Pulmonary:     Effort: Pulmonary effort is normal. No respiratory distress.     Breath sounds: Normal breath sounds. No wheezing or rales.     Comments: Respirations equal and unlabored, patient able to speak in full sentences, lungs clear to auscultation bilaterally  Abdominal:     General: Bowel sounds are normal. There is no distension.     Palpations: Abdomen is soft. There is no mass.     Tenderness: There is no abdominal tenderness. There is no guarding.     Comments: Abdomen soft, nondistended, nontender to palpation in all quadrants without guarding or peritoneal signs  Genitourinary:    Comments: Chaperone present during pelvic exam, erythema and induration extending from the inguinal fold down to the mid right inner thigh, but no swelling or induration over the labia or mons pubis.  No area of fluctuance or expressible drainage, no palpable crepitus Musculoskeletal:        General: No deformity.     Cervical back: Neck supple.     Comments: Erythema extending from the inguinal fold, down the mid inner right thigh with induration, but no area of fluctuance  Skin:    General: Skin is warm and dry.     Capillary Refill: Capillary refill takes less than 2 seconds.     Findings: Erythema present.  Neurological:     Mental Status: She is alert and oriented to person, place, and time.     Coordination: Coordination normal.     Comments: Speech is clear, able to follow commands Moves extremities without ataxia, coordination intact  Psychiatric:        Mood and Affect: Mood normal.        Behavior: Behavior normal.    ED Results / Procedures / Treatments   Labs (all labs ordered are listed, but only abnormal results are displayed) Labs Reviewed  COMPREHENSIVE METABOLIC PANEL - Abnormal; Notable for the following components:       Result Value   Sodium 132 (*)    Potassium 3.1 (*)    Glucose, Bld 120 (*)    Calcium 8.3 (*)    Albumin 3.1 (*)    ALT 61 (*)    All other components within normal limits  CBC WITH DIFFERENTIAL/PLATELET - Abnormal; Notable for the following components:   WBC 19.5 (*)    Hemoglobin 10.7 (*)    HCT 33.9 (*)    MCV 71.8 (*)    MCH 22.7 (*)    RDW 15.9 (*)  Neutro Abs 16.7 (*)    Monocytes Absolute 1.1 (*)    Eosinophils Absolute 0.7 (*)    Abs Immature Granulocytes 0.15 (*)    All other components within normal limits  CULTURE, BLOOD (ROUTINE X 2)  CULTURE, BLOOD (ROUTINE X 2)  LACTIC ACID, PLASMA  PROTIME-INR  LACTIC ACID, PLASMA  URINALYSIS, ROUTINE W REFLEX MICROSCOPIC  I-STAT BETA HCG BLOOD, ED ([facility], [facility], [facility] ONLY)    EKG None  Radiology CT FEMUR RIGHT W CONTRAST  Result Date: 09/06/2021 CLINICAL DATA:  Perianal abscess or fistula suspected. EXAM: CT OF THE LOWER RIGHT EXTREMITY WITH CONTRAST TECHNIQUE: Multidetector CT imaging of the lower right extremity was performed according to the standard protocol following intravenous contrast administration. RADIATION DOSE REDUCTION: This exam was performed according to the departmental dose-optimization program which includes automated exposure control, adjustment of the mA and/or kV according to patient size and/or use of iterative reconstruction technique. CONTRAST:  OMNIPAQUE IOHEXOL 300 MG/ML  SOLN COMPARISON:  None. FINDINGS: Bones/Joint/Cartilage No fracture or dislocation. Normal alignment. No joint effusion. Ligaments Ligaments are suboptimally evaluated by CT. Muscles and Tendons Muscles are normal in bulk and density. No intramuscular hematoma or fluid collection. Soft tissue There is skin thickening and subcutaneous soft tissue edema of the right upper medial thigh and gluteal region. No drainable fluid collection or abscess. The infectious process does not extends to the anal region. No evidence of perianal  abscess. IMPRESSION: 1. Skin thickening and marked subcutaneous soft tissue edema about the right upper medial thigh and gluteal region consistent with cellulitis. No drainable fluid collection or abscess. Clinical correlation and continued follow-up is recommended. 2.  No CT evidence of perianal abscess. 3.  No acute osseous abnormality. Electronically Signed   By: Larose Hires D.O.   On: 09/06/2021 14:36    Procedures .Critical Care Performed by: Dartha Lodge, PA-C Authorized by: Dartha Lodge, PA-C   Critical care provider statement:    Critical care time (minutes):  30   Critical care was necessary to treat or prevent imminent or life-threatening deterioration of the following conditions:  Sepsis   Critical care was time spent personally by me on the following activities:  Development of treatment plan with patient or surrogate, discussions with consultants, evaluation of patient's response to treatment, examination of patient, ordering and review of laboratory studies, ordering and review of radiographic studies, ordering and performing treatments and interventions, pulse oximetry, re-evaluation of patient's condition and review of old charts   Care discussed with: admitting provider      Medications Ordered in ED Medications  sodium chloride 0.9 % bolus 1,000 mL (0 mLs Intravenous Stopped 09/06/21 1521)  ondansetron (ZOFRAN) injection 4 mg (4 mg Intravenous Given 09/06/21 1322)  morphine (PF) 4 MG/ML injection 4 mg (4 mg Intravenous Given 09/06/21 1322)  acetaminophen (TYLENOL) tablet 1,000 mg (1,000 mg Oral Given 09/06/21 1322)  iohexol (OMNIPAQUE) 300 MG/ML solution 100 mL (100 mLs Intravenous Contrast Given 09/06/21 1350)  vancomycin (VANCOCIN) IVPB 1000 mg/200 mL premix (0 mg Intravenous Stopped 09/06/21 1706)  ceFEPIme (MAXIPIME) 2 g in sodium chloride 0.9 % 100 mL IVPB (0 g Intravenous Stopped 09/06/21 1549)  morphine (PF) 4 MG/ML injection 4 mg (4 mg Intravenous Given 09/06/21 1521)     ED Course/ Medical Decision Making/ A&P                           This patient presents to the  ED for concern of worsening groin infection now with fevers and systemic symptoms, this involves an extensive number of treatment options, and is a complaint that carries with it a high risk of complications and morbidity.  The differential diagnosis includes sepsis, cellulitis, abscess, necrotizing fasciitis   Co morbidities that complicate the patient evaluation  Hypertension, obesity   Additional history obtained:  Additional history obtained from chart review External records from outside source obtained and reviewed including recent urgent care notes   Lab Tests:  I Ordered, and personally interpreted labs.  The pertinent results include: Leukocytosis of 19.5, stable hemoglobin, mild hyponatremia and hypokalemia, normal renal function and liver function.  Fortunately lactic acid is not elevated, UA without signs of infection, blood cultures pending.     Imaging Studies ordered:  I ordered imaging studies including CT right femur with contrast to assess for potential fluid collection or soft tissue gas I independently visualized and interpreted imaging which showed significant cellulitis but no drainable fluid collection or soft tissue gas noted I agree with the radiologist interpretation   Cardiac Monitoring:  The patient was maintained on a cardiac monitor.  I personally viewed and interpreted the cardiac monitored which showed an underlying rhythm of: NSR   Medicines ordered and prescription drug management:  I ordered medication including IV fluids, IV morphine and Zofran for symptom management and IV Zosyn and cefepime given failure of outpatient antibiotics with worsening cellulitis meeting sepsis criteria Reevaluation of the patient after these medicines showed that the patient improved I have reviewed the patients home medicines and have made adjustments as  needed   Critical Interventions:  Broad-spectrum antibiotics   Consultations Obtained:  I requested consultation with the hospitalist, Dr. Adrian Blackwater,  and discussed lab and imaging findings as well as pertinent plan - they will see and admit the patient.   Problem List / ED Course:  Patient with worsening soft tissue infection of the right inner thigh, initial concern for potential deep space abscess or necrotizing fasciitis but fortunately imaging shows no evidence of abscess or soft tissue gas.  Patient has not had any improvement with outpatient oral antibiotics and at this point meets sepsis criteria, but does not appear to be in septic shock.  Patient treated with broad-spectrum IV antibiotics and will require admission    Dispostion:  After consideration of the diagnostic results and the patients response to treatment, I feel that the patent would benefit from Admission.          Final Clinical Impression(s) / ED Diagnoses Final diagnoses:  Sepsis due to cellulitis [facility])    Rx / DC Orders ED Discharge Orders     None         Legrand Rams 09/07/21 1152    Mancel Bale, MD 09/09/21 1324

## 2021-09-06 NOTE — ED Triage Notes (Signed)
Patient states she was put on antibiotics last Thursday for pimples near her vagina and she continued to take til today  Last Sunday she noticed a small pimple on her right thigh and began to grow daily  Patient states that yesterday she was having headache, fever, chills and nausea  Patient states she has tried tylenol, and elevation without any relief  Patient states she has been taking some of her mothers' Prednisone

## 2021-09-06 NOTE — ED Provider Notes (Signed)
[facility]    CSN: [id number] Arrival date & time: 09/06/21  [id number]      History   Chief Complaint Chief Complaint  Patient presents with   Abscess    Abscess on thigh    HPI Jacqueline Alvarado is a 26 y.o. female.   Presenting today with a aggressively worsening abscess to her right upper thigh, groin region for the past week and a half.  She states she was seen last Thursday for this issue, started on a course of Bactrim which she has completed yesterday compliantly.  She states that she now has a fever, headache, chills, nausea, malaise and also has some leg weakness due to the extent of the infection in her leg.  She is trying Tylenol, elevation with no relief.   Past Medical History:  Diagnosis Date   Abdominal pain, chronic, right upper quadrant    Abnormal Pap smear of cervix    Anemia    iron deficiency   Anxiety    BPD (bronchopulmonary dysplasia)    Depression    Headache(784.0)    Hypertension    Papanicolaou smear of cervix with positive high risk human papilloma virus (HPV) test 05/31/2019   05/2019 repeat HPV in 1 year    PONV (postoperative nausea and vomiting)    Hx: of nausea only at age 61    Patient Active Problem List   Diagnosis Date Noted   Essential hypertension 11/07/2020   Papanicolaou smear of cervix with positive high risk human papilloma virus (HPV) test 05/31/2019   Menorrhagia with irregular cycle 05/24/2019   Acute cholecystitis 02/01/2017   Calculus of gallbladder with acute cholecystitis without obstruction    Biliary colic    Abdominal pain 01/29/2017   Gallstones    Inflammation of the retroperitoneum    Cicatrix    Rectal bleeding 12/20/2015   Hemorrhoids 12/20/2015   Hypertrophy of nasal turbinates 06/24/2013   Tonsillar hypertrophy 06/24/2013   Obstructive sleep apnea (adult) (pediatric) 06/24/2013   Morbid obesity ([place]) 06/24/2013    Past Surgical History:  Procedure Laterality Date   CHOLECYSTECTOMY N/A  02/02/2017   Procedure: LAPAROSCOPIC CHOLECYSTECTOMY;  Surgeon: Aviva Signs, MD;  Location: [facility];  Service: General;  Laterality: N/A;   COLONOSCOPY N/A 01/07/2016   Dr. Oneida Alar: Nonbleeding internal hemorrhoids, large. 3 bands successfully placed. Distal ileum containing multiple ulcers which she felt was related to NSAIDs. Biopsies nonspecific.   HEMORRHOID BANDING N/A 01/07/2016   Procedure: HEMORRHOID BANDING;  Surgeon: Danie Binder, MD;  Location: [facility];  Service: Endoscopy;  Laterality: N/A;   MASS EXCISION N/A 11/26/2016   Procedure: EXCISION OF ABDOMINAL CICATRIX 4CM;  Surgeon: Aviva Signs, MD;  Location: [facility];  Service: General;  Laterality: N/A;  p knows to arrive at 7:50   NASAL SEPTOPLASTY W/ TURBINOPLASTY Bilateral 06/24/2013   Procedure: TURBINATE REDUCTION;  Surgeon: Jodi Marble, MD;  Location: [place];  Service: ENT;  Laterality: Bilateral;   TONSILLECTOMY AND ADENOIDECTOMY Bilateral 06/24/2013   Procedure: TONSILLECTOMY ;  Surgeon: Jodi Marble, MD;  Location: [place];  Service: ENT;  Laterality: Bilateral;   TUMOR EXCISION     Hx: of right side of neck   WISDOM TOOTH EXTRACTION      OB History     Gravida  1   Para      Term      Preterm      AB  1   Living  0  SAB      IAB      Ectopic      Multiple      Live Births  0            Home Medications    Prior to Admission medications   Medication Sig Start Date End Date Taking? Authorizing Provider  citalopram (CELEXA) 20 MG tablet Take 1 tablet (20 mg total) by mouth daily. 11/07/20   Myrtis Ser, CNM  LARIN FE 1/20 1-20 MG-MCG tablet Take 1 tablet by mouth once daily Patient not taking: Reported on 11/07/2020 10/15/20   Estill Dooms, NP  lisinopril-hydrochlorothiazide (ZESTORETIC) 20-12.5 MG tablet Take 1 tablet by mouth daily. 11/09/20   Estill Dooms, NP  meloxicam (MOBIC) 15 MG tablet Take 1 tablet (15 mg total) by mouth daily. 01/31/21   Wurst, [place], PA-C   norethindrone (MICRONOR) 0.35 MG tablet Take 1 tablet (0.35 mg total) by mouth daily. 11/07/20   Myrtis Ser, CNM    Family History Family History  Problem Relation Age of Onset   Cancer - Other Mother    Arthritis Mother    Breast cancer Mother    Hypertension Father    COPD Father    Melanoma Father    Cancer - Cervical Other    Diabetes Other    Stroke Other     Social History Social History   Tobacco Use   Smoking status: Former    Years: 1.00    Types: Cigarettes    Quit date: 10/22/2016    Years since quitting: 4.8   Smokeless tobacco: Never   Tobacco comments:    smoked only 1-2 cig weekly for 1 year  Vaping Use   Vaping Use: Never used  Substance Use Topics   Alcohol use: Yes    Alcohol/week: 0.0 standard drinks    Comment: occasional   Drug use: Yes    Frequency: 7.0 times per week    Types: Marijuana    Comment: 3x/week     Allergies   Patient has no known allergies.   Review of Systems Review of Systems Per HPI  Physical Exam Triage Vital Signs ED Triage Vitals  Enc Vitals Group     BP 09/06/21 0944 136/82     Pulse Rate 09/06/21 0944 (!) 106     Resp 09/06/21 0944 18     Temp 09/06/21 0944 (!) 100.4 F (38 C)     Temp Source 09/06/21 0944 Oral     SpO2 09/06/21 0944 98 %     Weight --      Height --      Head Circumference --      Peak Flow --      Pain Score 09/06/21 0947 9     Pain Loc --      Pain Edu? --      Excl. in [place]? --    No data found.  Updated Vital Signs BP 136/82 (BP Location: Right Arm)    Pulse (!) 106    Temp (!) 100.4 F (38 C) (Oral)    Resp 18    LMP 08/14/2021 (Exact Date)    SpO2 98%   Visual Acuity Right Eye Distance:   Left Eye Distance:   Bilateral Distance:    Right Eye Near:   Left Eye Near:    Bilateral Near:     Physical Exam Vitals and nursing note reviewed.  Constitutional:      Appearance:  Normal appearance. She is not ill-appearing.  HENT:     Head: Atraumatic.  Eyes:      Extraocular Movements: Extraocular movements intact.     Conjunctiva/sclera: Conjunctivae normal.  Cardiovascular:     Rate and Rhythm: Normal rate and regular rhythm.     Heart sounds: Normal heart sounds.  Pulmonary:     Effort: Pulmonary effort is normal.     Breath sounds: Normal breath sounds.  Musculoskeletal:        General: Swelling and tenderness present. No signs of injury. Normal range of motion.     Cervical back: Normal range of motion and neck supple.  Skin:    General: Skin is warm and dry.     Findings: Erythema present.     Comments: Extensive area of erythema, edema extending from right groin fold and involving the majority of her medial upper right leg proximally, severely tender to palpation.  No drainage, bleeding  Neurological:     Mental Status: She is alert and oriented to person, place, and time.     Comments: Right lower extremity neurovascularly intact  Psychiatric:        Mood and Affect: Mood normal.        Thought Content: Thought content normal.        Judgment: Judgment normal.     UC Treatments / Results  Labs (all labs ordered are listed, but only abnormal results are displayed) Labs Reviewed - No data to display  EKG   Radiology No results found.  Procedures Procedures (including critical care time)  Medications Ordered in UC Medications  ibuprofen (ADVIL) tablet 800 mg (800 mg Oral Given 09/06/21 1014)    Initial Impression / Assessment and Plan / UC Course  I have reviewed the triage vital signs and the nursing notes.  Pertinent labs & imaging results that were available during my care of the patient were reviewed by me and considered in my medical decision making (see chart for details).     Febrile and tachycardic in triage, ibuprofen given at this time.  Upon examination of the area, appears to be extensive with no superficial areas that appear accessible for drainage.  Concern regarding extent of invasion at this time so will  refer to the emergency department for further evaluation and management.  Work note given to cover for today.  Patient declines EMS transport and wishes to go via private vehicle, will call someone to help her get to the emergency department.  She is hemodynamically stable for transport at this time.  Final Clinical Impressions(s) / UC Diagnoses   Final diagnoses:  Abscess  Fever, unspecified   Discharge Instructions   None    ED Prescriptions   None    PDMP not reviewed this encounter.   Volney American, [place] 09/06/21 1037

## 2021-09-06 NOTE — H&P (Signed)
History and Physical    Patient: Jacqueline Alvarado [id number] DOB: Oct 23, 1995 DOA: 09/06/2021 DOS: the patient was seen and examined on 09/06/2021 PCP: Leslie Andrea, MD  Patient coming from: Home  Chief Complaint:  Chief Complaint  Patient presents with   Abscess    HPI: Jacqueline Alvarado is a 26 y.o. female with medical history significant of obesity, hypertension.  Patient seen for right thigh cellulitis that started a few weeks ago.  Patient has been on 2 rounds of antibiotics.  Both of the time she was on Bactrim.  She started the last course about a week ago and still has 4 days left.  She has been having fevers, chills. The area has been becoming larger and incorporates much of her inner thigh.   Review of Systems: As mentioned in the history of present illness. All other systems reviewed and are negative. Past Medical History:  Diagnosis Date   Abdominal pain, chronic, right upper quadrant    Abnormal Pap smear of cervix    Anemia    iron deficiency   Anxiety    BPD (bronchopulmonary dysplasia)    Depression    Headache(784.0)    Hypertension    Papanicolaou smear of cervix with positive high risk human papilloma virus (HPV) test 05/31/2019   05/2019 repeat HPV in 1 year    PONV (postoperative nausea and vomiting)    Hx: of nausea only at age 2   Past Surgical History:  Procedure Laterality Date   CHOLECYSTECTOMY N/A 02/02/2017   Procedure: LAPAROSCOPIC CHOLECYSTECTOMY;  Surgeon: Aviva Signs, MD;  Location: [facility];  Service: General;  Laterality: N/A;   COLONOSCOPY N/A 01/07/2016   Dr. Oneida Alar: Nonbleeding internal hemorrhoids, large. 3 bands successfully placed. Distal ileum containing multiple ulcers which she felt was related to NSAIDs. Biopsies nonspecific.   HEMORRHOID BANDING N/A 01/07/2016   Procedure: HEMORRHOID BANDING;  Surgeon: Danie Binder, MD;  Location: [facility];  Service: Endoscopy;  Laterality: N/A;   MASS EXCISION N/A 11/26/2016   Procedure:  EXCISION OF ABDOMINAL CICATRIX 4CM;  Surgeon: Aviva Signs, MD;  Location: [facility];  Service: General;  Laterality: N/A;  p knows to arrive at 7:50   NASAL SEPTOPLASTY W/ TURBINOPLASTY Bilateral 06/24/2013   Procedure: TURBINATE REDUCTION;  Surgeon: Jodi Marble, MD;  Location: [facility];  Service: ENT;  Laterality: Bilateral;   TONSILLECTOMY AND ADENOIDECTOMY Bilateral 06/24/2013   Procedure: TONSILLECTOMY ;  Surgeon: Jodi Marble, MD;  Location: [facility];  Service: ENT;  Laterality: Bilateral;   TUMOR EXCISION     Hx: of right side of neck   WISDOM TOOTH EXTRACTION     Social History:  reports that she quit smoking about 4 years ago. Her smoking use included cigarettes. She has never used smokeless tobacco. She reports current alcohol use. She reports current drug use. Frequency: 7.00 times per week. Drug: Marijuana.  No Known Allergies  Family History  Problem Relation Age of Onset   Cancer - Other Mother    Arthritis Mother    Breast cancer Mother    Hypertension Father    COPD Father    Melanoma Father    Cancer - Cervical Other    Diabetes Other    Stroke Other     Prior to Admission medications   Medication Sig Start Date End Date Taking? Authorizing Provider  lisinopril (ZESTRIL) 10 MG tablet Take 10 mg by mouth daily. 07/18/21  Yes [provider]  predniSONE (DELTASONE) 10 MG tablet  Take 10 mg by mouth daily with breakfast.   Yes [provider]  sulfamethoxazole-trimethoprim (BACTRIM DS) 800-160 MG tablet Take 1 tablet by mouth 2 (two) times daily. 08/30/21  Yes [provider]  citalopram (CELEXA) 20 MG tablet Take 1 tablet (20 mg total) by mouth daily. Patient not taking: Reported on 09/06/2021 11/07/20   Myrtis Ser, CNM  LARIN FE 1/20 1-20 MG-MCG tablet Take 1 tablet by mouth once daily Patient not taking: Reported on 11/07/2020 10/15/20   Estill Dooms, NP  lisinopril-hydrochlorothiazide (ZESTORETIC) 20-12.5 MG tablet Take 1 tablet by mouth  daily. Patient not taking: Reported on 09/06/2021 11/09/20   Estill Dooms, NP  meloxicam (MOBIC) 15 MG tablet Take 1 tablet (15 mg total) by mouth daily. Patient not taking: Reported on 09/06/2021 01/31/21   Wurst, [place], PA-C  norethindrone (MICRONOR) 0.35 MG tablet Take 1 tablet (0.35 mg total) by mouth daily. Patient not taking: Reported on 09/06/2021 11/07/20   Myrtis Ser, CNM    Physical Exam: Vitals:   09/06/21 1445 09/06/21 1500 09/06/21 1530 09/06/21 1644  BP: 121/62 (!) 114/56 117/72 130/72  Pulse: 82 84 81 85  Resp: 18 18 20 17   Temp: 98.9 F (37.2 C)  98.5 F (36.9 C) 97.9 F (36.6 C)  TempSrc: Oral     SpO2: 99% 100% 100% 100%  Weight:      Height:       General: Young female. Awake and alert and oriented x3. No acute cardiopulmonary distress.  HEENT: Normocephalic atraumatic.  Right and left ears normal in appearance.  Pupils equal, round, reactive to light. Extraocular muscles are intact. Sclerae anicteric and noninjected.  Moist mucosal membranes. No mucosal lesions.  Neck: Neck supple without lymphadenopathy. No carotid bruits. No masses palpated.  Cardiovascular: Regular rate with normal S1-S2 sounds. No murmurs, rubs, gallops auscultated. No JVD.  Respiratory: Good respiratory effort with no wheezes, rales, rhonchi. Lungs clear to auscultation bilaterally.  No accessory muscle use. Abdomen: Soft, nontender, nondistended. Active bowel sounds. No masses or hepatosplenomegaly  Skin: There is a large area of erythema on the medial thigh that begins at the inguinal crease and extends to a third of the way down her medial thigh.  There is a 7 cm indurated area that is most proximal to the groin.  It does not extend up onto the vulva or the mons pubis.  There is no area of fluctuance.  Dry, warm to touch. 2+ dorsalis pedis and radial pulses. Musculoskeletal: No calf or leg pain. All major joints not erythematous nontender.  No upper or lower joint deformation.  Good  ROM.  No contractures  Psychiatric: Intact judgment and insight. Pleasant and cooperative. Neurologic: No focal neurological deficits. Strength is 5/5 and symmetric in upper and lower extremities.  Cranial nerves II through XII are grossly intact.   Data Reviewed: Results for orders placed or performed during the hospital encounter of 09/06/21 (from the past 24 hour(s))  Comprehensive metabolic panel     Status: Abnormal   Collection Time: 09/06/21 12:20 PM  Result Value Ref Range   Sodium 132 (L) 135 - 145 mmol/L   Potassium 3.1 (L) 3.5 - 5.1 mmol/L   Chloride 102 98 - 111 mmol/L   CO2 22 22 - 32 mmol/L   Glucose, Bld 120 (H) 70 - 99 mg/dL   BUN 9 6 - 20 mg/dL   Creatinine, Ser 0.88 0.44 - 1.00 mg/dL   Calcium 8.3 (L) 8.9 -  10.3 mg/dL   Total Protein 6.9 6.5 - 8.1 g/dL   Albumin 3.1 (L) 3.5 - 5.0 g/dL   AST 37 15 - 41 U/L   ALT 61 (H) 0 - 44 U/L   Alkaline Phosphatase 41 38 - 126 U/L   Total Bilirubin 0.6 0.3 - 1.2 mg/dL   GFR, Estimated >60 >60 mL/min   Anion gap 8 5 - 15  Lactic acid, plasma     Status: None   Collection Time: 09/06/21 12:20 PM  Result Value Ref Range   Lactic Acid, Venous 1.3 0.5 - 1.9 mmol/L  CBC with Differential     Status: Abnormal   Collection Time: 09/06/21 12:20 PM  Result Value Ref Range   WBC 19.5 (H) 4.0 - 10.5 K/uL   RBC 4.72 3.87 - 5.11 MIL/uL   Hemoglobin 10.7 (L) 12.0 - 15.0 g/dL   HCT 33.9 (L) 36.0 - 46.0 %   MCV 71.8 (L) 80.0 - 100.0 fL   MCH 22.7 (L) 26.0 - 34.0 pg   MCHC 31.6 30.0 - 36.0 g/dL   RDW 15.9 (H) 11.5 - 15.5 %   Platelets 252 150 - 400 K/uL   nRBC 0.1 0.0 - 0.2 %   Neutrophils Relative % 86 %   Neutro Abs 16.7 (H) 1.7 - 7.7 K/uL   Lymphocytes Relative 4 %   Lymphs Abs 0.8 0.7 - 4.0 K/uL   Monocytes Relative 5 %   Monocytes Absolute 1.1 (H) 0.1 - 1.0 K/uL   Eosinophils Relative 4 %   Eosinophils Absolute 0.7 (H) 0.0 - 0.5 K/uL   Basophils Relative 0 %   Basophils Absolute 0.1 0.0 - 0.1 K/uL   Immature Granulocytes 1  %   Abs Immature Granulocytes 0.15 (H) 0.00 - 0.07 K/uL  Protime-INR     Status: None   Collection Time: 09/06/21 12:20 PM  Result Value Ref Range   Prothrombin Time 14.5 11.4 - 15.2 seconds   INR 1.1 0.8 - 1.2  I-Stat Beta hCG blood, ED ([facility], [facility], [facility] only)     Status: None   Collection Time: 09/06/21 12:20 PM  Result Value Ref Range   I-stat hCG, quantitative <5.0 <5 mIU/mL   Comment 3          Culture, blood (Routine x 2)     Status: None (Preliminary result)   Collection Time: 09/06/21 12:21 PM   Specimen: BLOOD RIGHT HAND  Result Value Ref Range   Specimen Description      BLOOD RIGHT HAND BOTTLES DRAWN AEROBIC AND ANAEROBIC   Special Requests      Blood Culture adequate volume Performed at [facility], [address] [place], [place] [address]    Culture PENDING    Report Status PENDING   Culture, blood (Routine x 2)     Status: None (Preliminary result)   Collection Time: 09/06/21 12:21 PM   Specimen: BLOOD RIGHT FOREARM  Result Value Ref Range   Specimen Description      BLOOD RIGHT FOREARM BOTTLES DRAWN AEROBIC AND ANAEROBIC   Special Requests      Blood Culture adequate volume Performed at [facility], [address] [place], [place] [address]    Culture PENDING    Report Status PENDING   Resp Panel by RT-PCR (Flu A&B, Covid) Nasopharyngeal Swab     Status: None   Collection Time: 09/06/21  3:22 PM   Specimen: Nasopharyngeal Swab; Nasopharyngeal(NP) swabs in vial transport medium  Result Value Ref  Range   SARS Coronavirus 2 by RT PCR NEGATIVE NEGATIVE   Influenza A by PCR NEGATIVE NEGATIVE   Influenza B by PCR NEGATIVE NEGATIVE  Urinalysis, Routine w reflex microscopic     Status: Abnormal   Collection Time: 09/06/21  4:00 PM  Result Value Ref Range   Color, Urine YELLOW YELLOW   APPearance CLEAR CLEAR   Specific Gravity, Urine <1.005 (L) 1.005 - 1.030   pH 6.5 5.0 - 8.0   Glucose, UA NEGATIVE NEGATIVE mg/dL   Hgb urine dipstick NEGATIVE NEGATIVE    Bilirubin Urine NEGATIVE NEGATIVE   Ketones, ur NEGATIVE NEGATIVE mg/dL   Protein, ur NEGATIVE NEGATIVE mg/dL   Nitrite NEGATIVE NEGATIVE   Leukocytes,Ua NEGATIVE NEGATIVE   CT FEMUR RIGHT W CONTRAST  Result Date: 09/06/2021 CLINICAL DATA:  Perianal abscess or fistula suspected. EXAM: CT OF THE LOWER RIGHT EXTREMITY WITH CONTRAST TECHNIQUE: Multidetector CT imaging of the lower right extremity was performed according to the standard protocol following intravenous contrast administration. RADIATION DOSE REDUCTION: This exam was performed according to the departmental dose-optimization program which includes automated exposure control, adjustment of the mA and/or kV according to patient size and/or use of iterative reconstruction technique. CONTRAST:  155mL OMNIPAQUE IOHEXOL 300 MG/ML  SOLN COMPARISON:  None. FINDINGS: Bones/Joint/Cartilage No fracture or dislocation. Normal alignment. No joint effusion. Ligaments Ligaments are suboptimally evaluated by CT. Muscles and Tendons Muscles are normal in bulk and density. No intramuscular hematoma or fluid collection. Soft tissue There is skin thickening and subcutaneous soft tissue edema of the right upper medial thigh and gluteal region. No drainable fluid collection or abscess. The infectious process does not extends to the anal region. No evidence of perianal abscess. IMPRESSION: 1. Skin thickening and marked subcutaneous soft tissue edema about the right upper medial thigh and gluteal region consistent with cellulitis. No drainable fluid collection or abscess. Clinical correlation and continued follow-up is recommended. 2.  No CT evidence of perianal abscess. 3.  No acute osseous abnormality. Electronically Signed   By: Keane Police D.O.   On: 09/06/2021 14:36     Assessment and Plan: No notes have been filed under this hospital service. Service: Hospitalist  Principal Problem:   Cellulitis of right thigh Active Problems:   Morbid obesity (HCC)    Essential hypertension   Elevated LFTs  Cellulitis MRSA swab Admit IV antibiotics: Cefepime and vancomycin  Check CBC in the morning Elevated LFT Mild elevation of ALT. Recheck LFTs in the morning Check hepatitis panel Right upper quadrant ultrasound Hypertension Continue antihypertensive Obesity    Advance Care Planning:   Code Status: Prior full code  Consults: None  Family Communication: Mother present during interview and exam  Severity of Illness: The appropriate patient status for this patient is INPATIENT. Inpatient status is judged to be reasonable and necessary in order to provide the required intensity of service to ensure the patient's safety. The patient's presenting symptoms, physical exam findings, and initial radiographic and laboratory data in the context of their chronic comorbidities is felt to place them at high risk for further clinical deterioration. Furthermore, it is not anticipated that the patient will be medically stable for discharge from the hospital within 2 midnights of admission.   * I certify that at the point of admission it is my clinical judgment that the patient will require inpatient hospital care spanning beyond 2 midnights from the point of admission due to high intensity of service, high risk for further deterioration and high frequency  of surveillance required.*  Author: Truett Mainland, DO 09/06/2021 5:33 PM  For on call review [email or web address].

## 2021-09-07 ENCOUNTER — Inpatient Hospital Stay (HOSPITAL_COMMUNITY): Payer: Self-pay

## 2021-09-07 LAB — COMPREHENSIVE METABOLIC PANEL
ALT: 71 U/L — ABNORMAL HIGH (ref 0–44)
AST: 42 U/L — ABNORMAL HIGH (ref 15–41)
Albumin: 2.5 g/dL — ABNORMAL LOW (ref 3.5–5.0)
Alkaline Phosphatase: 40 U/L (ref 38–126)
Anion gap: 9 (ref 5–15)
BUN: 6 mg/dL (ref 6–20)
CO2: 22 mmol/L (ref 22–32)
Calcium: 8.1 mg/dL — ABNORMAL LOW (ref 8.9–10.3)
Chloride: 103 mmol/L (ref 98–111)
Creatinine, Ser: 0.68 mg/dL (ref 0.44–1.00)
GFR, Estimated: >60 mL/min (ref >60)
Glucose, Bld: 93 mg/dL (ref 70–99)
Potassium: 3.5 mmol/L (ref 3.5–5.1)
Sodium: 134 mmol/L — ABNORMAL LOW (ref 135–145)
Total Bilirubin: 1 mg/dL (ref 0.3–1.2)
Total Protein: 6.1 g/dL — ABNORMAL LOW (ref 6.5–8.1)

## 2021-09-07 LAB — SURGICAL PCR SCREEN
MRSA, PCR: NEGATIVE
Staphylococcus aureus: NEGATIVE

## 2021-09-07 LAB — CBC
HCT: 31.9 % — ABNORMAL LOW (ref 36.0–46.0)
Hemoglobin: 10.2 g/dL — ABNORMAL LOW (ref 12.0–15.0)
MCH: 23.2 pg — ABNORMAL LOW (ref 26.0–34.0)
MCHC: 32 g/dL (ref 30.0–36.0)
MCV: 72.5 fL — ABNORMAL LOW (ref 80.0–100.0)
Platelets: 246 10*3/uL (ref 150–400)
RBC: 4.4 MIL/uL (ref 3.87–5.11)
RDW: 16.2 % — ABNORMAL HIGH (ref 11.5–15.5)
WBC: 14.5 10*3/uL — ABNORMAL HIGH (ref 4.0–10.5)
nRBC: 0 % (ref 0.0–0.2)

## 2021-09-07 MED ORDER — KETOROLAC TROMETHAMINE 30 MG/ML IJ SOLN
30.0000 mg | Freq: Once | INTRAMUSCULAR | Status: AC
  Administered 2021-09-07: 30 mg via INTRAVENOUS
  Filled 2021-09-07: qty 1

## 2021-09-07 MED ORDER — MELATONIN 3 MG PO TABS
6.0000 mg | ORAL_TABLET | Freq: Once | ORAL | Status: AC
  Administered 2021-09-07: 6 mg via ORAL
  Filled 2021-09-07: qty 2

## 2021-09-07 MED ORDER — LORATADINE 10 MG PO TABS
10.0000 mg | ORAL_TABLET | Freq: Every day | ORAL | Status: DC | PRN
  Administered 2021-09-07: 10 mg via ORAL
  Filled 2021-09-07: qty 1

## 2021-09-07 NOTE — Progress Notes (Addendum)
PROGRESS NOTE     Jacqueline Alvarado, is a 26 y.o. female, DOB - 05-Jun-1996, QMK:[id number]  Admit date - 09/06/2021   Admitting Physician Truett Mainland, DO  Outpatient Primary MD for the patient is Leslie Andrea, MD  LOS - 1  Chief Complaint  Patient presents with   Abscess        Brief Narrative:  26 y.o. female with medical history significant of morbid obesity, hypertension admitted on 09/06/2021 with recurrent right thigh area cellulitis after failing outpatient treatment with Bactrim x2 rounds    -Assessment and Plan:  1) sepsis secondary to right Thigh cellulitis--- POA -Patient met sepsis criteria on admission with leukocytosis, fevers tachycardia tachypnea  treated with Bactrim about 3 weeks ago treated ago with Bactrim about a week ago WBC 19.5 >>14.5 -Continue IV cefepime and vancomycin -Continue IV fluids --CT of Rt Femur--with cellulitis but no abscess  -Fever appears to be resolving--Tmax was 101.5 to current 97.9 -  2)Morbid Obesity- -Low calorie diet, portion control and increase physical activity discussed with patient -Body mass index is 50.63 kg/m.  3) fatty liver with elevated LFTs--- acute hepatitis panel negative -Right upper quadrant ultrasound with status post cholecystectomy and enlarged fatty liver -Check fasting lipid profile and TSH  4)HTN--overall stable, continue lisinopril  Disposition/Need for in-Hospital Stay- patient unable to be discharged at this time due to --sepsis secondary to cellulitis of the right thigh, failed outpatient antibiotic therapy requiring IV antibiotics*  Status is: Inpatient   Disposition: The patient is from: Home              Anticipated d/c is to: Home              Anticipated d/c date is: 1 day              Patient currently is not medically stable to d/c. Barriers: Not Clinically Stable-   Code Status :  -  Code Status: Full Code   Family Communication:    NA (patient is alert, awake and coherent)    DVT Prophylaxis  :   - SCDs     Lab Results  Component Value Date   PLT 246 09/07/2021    Inpatient Medications  Scheduled Meds:  enoxaparin (LOVENOX) injection  70 mg Subcutaneous Q24H   lisinopril  10 mg Oral Daily   Continuous Infusions:  ceFEPime (MAXIPIME) IV 2 g (09/07/21 1431)   lactated ringers 100 mL/hr at 09/07/21 1102   vancomycin 1,500 mg (09/07/21 0811)   PRN Meds:.acetaminophen **OR** acetaminophen, bisacodyl, loratadine, ondansetron **OR** ondansetron (ZOFRAN) IV, oxyCODONE   Anti-infectives (From admission, onward)    Start     Dose/Rate Route Frequency Ordered Stop   09/07/21 0800  vancomycin (VANCOREADY) IVPB 1500 mg/300 mL        1,500 mg 150 mL/hr over 120 Minutes Intravenous Every 12 hours 09/06/21 1811     09/06/21 2200  ceFEPIme (MAXIPIME) 2 g in sodium chloride 0.9 % 100 mL IVPB        2 g 200 mL/hr over 30 Minutes Intravenous Every 8 hours 09/06/21 1809     09/06/21 1815  vancomycin (VANCOREADY) IVPB 1500 mg/300 mL        1,500 mg 150 mL/hr over 120 Minutes Intravenous Once 09/06/21 1809 09/06/21 2104   09/06/21 1515  vancomycin (VANCOCIN) IVPB 1000 mg/200 mL premix        1,000 mg 200 mL/hr over 60 Minutes Intravenous  Once 09/06/21 1504 09/06/21 1706  09/06/21 1515  ceFEPIme (MAXIPIME) 2 g in sodium chloride 0.9 % 100 mL IVPB        2 g 200 mL/hr over 30 Minutes Intravenous  Once 09/06/21 1504 09/06/21 1549         Subjective: Memory Gullickson today has  no emesis,  No chest pain,   No Further fever  Or chills  No abdominal pain -Right thigh area swelling and discomfort is not worse   Objective: Vitals:   09/06/21 2021 09/07/21 0447 09/07/21 1406 09/07/21 1711  BP: (!) 142/71 114/66 (!) 122/110 120/78  Pulse: 74 81 74   Resp: '20 20 16   ' Temp: 98.6 F (37 C) 98.9 F (37.2 C) 97.9 F (36.6 C)   TempSrc: Oral Oral    SpO2: 100% 100% 100%   Weight:      Height:        Intake/Output Summary (Last 24 hours) at 09/07/2021  1712 Last data filed at 09/07/2021 7035 Gross per 24 hour  Intake 746.29 ml  Output 1 ml  Net 745.29 ml   Filed Weights   09/06/21 1137  Weight: (!) 151 kg    Physical Exam  Gen:- Awake Alert, morbidly obese HEENT:- [email or web address].AT, No sclera icterus Neck-Supple Neck,No JVD,.  Lungs-  CTAB , fair symmetrical air movement CV- S1, S2 normal, regular  Abd-  +ve B.Sounds, Abd Soft, No tenderness, increased truncal adiposity    Extremity/Skin:-  pedal pulses present , right medial thigh area with induration, tenderness, warmth, and swelling Psych-affect is appropriate, oriented x3 Neuro-no new focal deficits, no tremors  Data Reviewed: I have personally reviewed following labs and imaging studies  CBC: Recent Labs  Lab 09/06/21 1220 09/07/21 0416  WBC 19.5* 14.5*  NEUTROABS 16.7*  --   HGB 10.7* 10.2*  HCT 33.9* 31.9*  MCV 71.8* 72.5*  PLT 252 009   Basic Metabolic Panel: Recent Labs  Lab 09/06/21 1220 09/07/21 0416  NA 132* 134*  K 3.1* 3.5  CL 102 103  CO2 22 22  GLUCOSE 120* 93  BUN 9 6  CREATININE 0.88 0.68  CALCIUM 8.3* 8.1*   GFR: Estimated Creatinine Clearance: 164.6 mL/min (by C-G formula based on SCr of 0.68 mg/dL). Liver Function Tests: Recent Labs  Lab 09/06/21 1220 09/07/21 0416  AST 37 42*  ALT 61* 71*  ALKPHOS 41 40  BILITOT 0.6 1.0  PROT 6.9 6.1*  ALBUMIN 3.1* 2.5*   Cardiac Enzymes: No results for input(s): CKTOTAL, CKMB, CKMBINDEX, TROPONINI in the last 168 hours. BNP (last 3 results) No results for input(s): PROBNP in the last 8760 hours. HbA1C: No results for input(s): HGBA1C in the last 72 hours. Sepsis Labs: '@LABRCNTIP' (procalcitonin:4,lacticidven:4) ) Recent Results (from the past 240 hour(s))  Culture, blood (Routine x 2)     Status: None (Preliminary result)   Collection Time: 09/06/21 12:21 PM   Specimen: BLOOD RIGHT HAND  Result Value Ref Range Status   Specimen Description   Final    BLOOD RIGHT HAND BOTTLES DRAWN AEROBIC AND  ANAEROBIC   Special Requests Blood Culture adequate volume  Final   Culture   Final    NO GROWTH < 24 HOURS Performed at [facility], [address] [place], [place] [address]    Report Status PENDING  Incomplete  Culture, blood (Routine x 2)     Status: None (Preliminary result)   Collection Time: 09/06/21 12:21 PM   Specimen: BLOOD RIGHT FOREARM  Result Value Ref Range Status  Specimen Description   Final    BLOOD RIGHT FOREARM BOTTLES DRAWN AEROBIC AND ANAEROBIC   Special Requests Blood Culture adequate volume  Final   Culture   Final    NO GROWTH < 24 HOURS Performed at [facility], [address] [place], [place] [address]    Report Status PENDING  Incomplete  Resp Panel by RT-PCR (Flu A&B, Covid) Nasopharyngeal Swab     Status: None   Collection Time: 09/06/21  3:22 PM   Specimen: Nasopharyngeal Swab; Nasopharyngeal(NP) swabs in vial transport medium  Result Value Ref Range Status   SARS Coronavirus 2 by RT PCR NEGATIVE NEGATIVE Final    Comment: (NOTE) SARS-CoV-2 target nucleic acids are NOT DETECTED.  The SARS-CoV-2 RNA is generally detectable in upper respiratory specimens during the acute phase of infection. The lowest concentration of SARS-CoV-2 viral copies this assay can detect is 138 copies/mL. A negative result does not preclude SARS-Cov-2 infection and should not be used as the sole basis for treatment or other patient management decisions. A negative result may occur with  improper specimen collection/handling, submission of specimen other than nasopharyngeal swab, presence of viral mutation(s) within the areas targeted by this assay, and inadequate number of viral copies(<138 copies/mL). A negative result must be combined with clinical observations, patient history, and epidemiological information. The expected result is Negative.  Fact Sheet for Patients:  EntrepreneurPulse.com.au  Fact Sheet for Healthcare Providers:   IncredibleEmployment.be  This test is no t yet approved or cleared by the [place] FDA and  has been authorized for detection and/or diagnosis of SARS-CoV-2 by FDA under an Emergency Use Authorization (EUA). This EUA will remain  in effect (meaning this test can be used) for the duration of the COVID-19 declaration under Section 564(b)(1) of the Act, 21 U.S.C.section 360bbb-3(b)(1), unless the authorization is terminated  or revoked sooner.       Influenza A by PCR NEGATIVE NEGATIVE Final   Influenza B by PCR NEGATIVE NEGATIVE Final    Comment: (NOTE) The Xpert Xpress SARS-CoV-2/FLU/RSV plus assay is intended as an aid in the diagnosis of influenza from Nasopharyngeal swab specimens and should not be used as a sole basis for treatment. Nasal washings and aspirates are unacceptable for Xpert Xpress SARS-CoV-2/FLU/RSV testing.  Fact Sheet for Patients: EntrepreneurPulse.com.au  Fact Sheet for Healthcare Providers: IncredibleEmployment.be  This test is not yet approved or cleared by the [place] FDA and has been authorized for detection and/or diagnosis of SARS-CoV-2 by FDA under an Emergency Use Authorization (EUA). This EUA will remain in effect (meaning this test can be used) for the duration of the COVID-19 declaration under Section 564(b)(1) of the Act, 21 U.S.C. section 360bbb-3(b)(1), unless the authorization is terminated or revoked.  Performed at [facility] [address], [place], [place] [address]   MRSA Next Gen by PCR, Nasal     Status: None   Collection Time: 09/06/21  3:37 PM   Specimen: Nasal Mucosa; Nasal Swab  Result Value Ref Range Status   MRSA by PCR Next Gen NOT DETECTED NOT DETECTED Final    Comment: (NOTE) The GeneXpert MRSA Assay (FDA approved for NASAL specimens only), is one component of a comprehensive MRSA colonization surveillance program. It is not intended to diagnose MRSA  infection nor to guide or monitor treatment for MRSA infections. Test performance is not FDA approved in patients less than 68 years old. Performed at [facility], [address], [place],  [address]   Surgical pcr  screen     Status: None   Collection Time: 09/07/21  5:40 AM   Specimen: Nasal Mucosa; Nasal Swab  Result Value Ref Range Status   MRSA, PCR NEGATIVE NEGATIVE Final   Staphylococcus aureus NEGATIVE NEGATIVE Final    Comment: (NOTE) The Xpert SA Assay (FDA approved for NASAL specimens in patients 53 years of age and older), is one component of a comprehensive surveillance program. It is not intended to diagnose infection nor to guide or monitor treatment. Performed at [facility], [address] [place], [place] [address]       Radiology Studies: CT FEMUR RIGHT W CONTRAST  Result Date: 09/06/2021 CLINICAL DATA:  Perianal abscess or fistula suspected. EXAM: CT OF THE LOWER RIGHT EXTREMITY WITH CONTRAST TECHNIQUE: Multidetector CT imaging of the lower right extremity was performed according to the standard protocol following intravenous contrast administration. RADIATION DOSE REDUCTION: This exam was performed according to the departmental dose-optimization program which includes automated exposure control, adjustment of the mA and/or kV according to patient size and/or use of iterative reconstruction technique. CONTRAST:  137m OMNIPAQUE IOHEXOL 300 MG/ML  SOLN COMPARISON:  None. FINDINGS: Bones/Joint/Cartilage No fracture or dislocation. Normal alignment. No joint effusion. Ligaments Ligaments are suboptimally evaluated by CT. Muscles and Tendons Muscles are normal in bulk and density. No intramuscular hematoma or fluid collection. Soft tissue There is skin thickening and subcutaneous soft tissue edema of the right upper medial thigh and gluteal region. No drainable fluid collection or abscess. The infectious process does not extends to the anal region. No evidence of  perianal abscess. IMPRESSION: 1. Skin thickening and marked subcutaneous soft tissue edema about the right upper medial thigh and gluteal region consistent with cellulitis. No drainable fluid collection or abscess. Clinical correlation and continued follow-up is recommended. 2.  No CT evidence of perianal abscess. 3.  No acute osseous abnormality. Electronically Signed   By: IKeane PoliceD.O.   On: 09/06/2021 14:36   UK[place]Abdomen Limited RUQ (LIVER/GB)  Result Date: 09/07/2021 CLINICAL DATA:  Abnormal liver function tests EXAM: ULTRASOUND ABDOMEN LIMITED RIGHT UPPER QUADRANT COMPARISON:  None. FINDINGS: Gallbladder: Not seen consistent with cholecystectomy. Common bile duct: Diameter: 5.7 mm Liver: There is increased echogenicity in the liver. No focal abnormality is seen. Portal vein is patent on color Doppler imaging with normal direction of blood flow towards the liver. Other: None. IMPRESSION: Status post cholecystectomy.  Enlarged fatty liver. No other sonographic abnormality is seen in the right upper quadrant. Electronically Signed   By: PElmer PickerM.D.   On: 09/07/2021 11:35     Scheduled Meds:  enoxaparin (LOVENOX) injection  70 mg Subcutaneous Q24H   lisinopril  10 mg Oral Daily   Continuous Infusions:  ceFEPime (MAXIPIME) IV 2 g (09/07/21 1431)   lactated ringers 100 mL/hr at 09/07/21 1102   vancomycin 1,500 mg (09/07/21 0811)     LOS: 1 day    CRoxan HockeyM.D on 09/07/2021 at 5:12 PM  Go to [email or web address] - for contact info  [facility] - Office  [phone] If 7PM-7AM, please contact night-coverage [email or web address] Password [facility]2/25/2023, 5:12 PM

## 2021-09-08 LAB — CBC
HCT: 31.5 % — ABNORMAL LOW (ref 36.0–46.0)
Hemoglobin: 9.7 g/dL — ABNORMAL LOW (ref 12.0–15.0)
MCH: 22.2 pg — ABNORMAL LOW (ref 26.0–34.0)
MCHC: 30.8 g/dL (ref 30.0–36.0)
MCV: 72.2 fL — ABNORMAL LOW (ref 80.0–100.0)
Platelets: 275 10*3/uL (ref 150–400)
RBC: 4.36 MIL/uL (ref 3.87–5.11)
RDW: 16 % — ABNORMAL HIGH (ref 11.5–15.5)
WBC: 12 10*3/uL — ABNORMAL HIGH (ref 4.0–10.5)
nRBC: 0 % (ref 0.0–0.2)

## 2021-09-08 LAB — COMPREHENSIVE METABOLIC PANEL
ALT: 47 U/L — ABNORMAL HIGH (ref 0–44)
AST: 20 U/L (ref 15–41)
Albumin: 2.3 g/dL — ABNORMAL LOW (ref 3.5–5.0)
Alkaline Phosphatase: 34 U/L — ABNORMAL LOW (ref 38–126)
Anion gap: 7 (ref 5–15)
BUN: 7 mg/dL (ref 6–20)
CO2: 25 mmol/L (ref 22–32)
Calcium: 8.2 mg/dL — ABNORMAL LOW (ref 8.9–10.3)
Chloride: 105 mmol/L (ref 98–111)
Creatinine, Ser: 0.68 mg/dL (ref 0.44–1.00)
GFR, Estimated: >60 mL/min (ref >60)
Glucose, Bld: 96 mg/dL (ref 70–99)
Potassium: 3.8 mmol/L (ref 3.5–5.1)
Sodium: 137 mmol/L (ref 135–145)
Total Bilirubin: 0.7 mg/dL (ref 0.3–1.2)
Total Protein: 5.9 g/dL — ABNORMAL LOW (ref 6.5–8.1)

## 2021-09-08 LAB — TSH: TSH: 3.861 u[IU]/mL (ref 0.350–4.500)

## 2021-09-08 LAB — LIPID PANEL
Cholesterol: 155 mg/dL (ref 0–200)
HDL: 17 mg/dL — ABNORMAL LOW (ref >40)
LDL Cholesterol: 106 mg/dL — ABNORMAL HIGH (ref 0–99)
Total CHOL/HDL Ratio: 9.1 RATIO
Triglycerides: 160 mg/dL — ABNORMAL HIGH (ref <150)
VLDL: 32 mg/dL (ref 0–40)

## 2021-09-08 MED ORDER — SODIUM CHLORIDE 0.9 % IV SOLN: 2.0000 g | INTRAVENOUS | Status: DC

## 2021-09-08 MED ORDER — OXYCODONE HCL 5 MG PO TABS
5.0000 mg | ORAL_TABLET | Freq: Three times a day (TID) | ORAL | 0 refills | Status: DC | PRN
Start: 2021-09-08 — End: 2022-01-22

## 2021-09-08 MED ORDER — FLUCONAZOLE 150 MG PO TABS: 150.0000 mg | ORAL_TABLET | Freq: Once | ORAL | 1 refills | Status: AC

## 2021-09-08 MED ORDER — SODIUM CHLORIDE 0.9 % IV SOLN
2.0000 g | INTRAVENOUS | Status: DC
  Administered 2021-09-08: 2 g via INTRAVENOUS
  Filled 2021-09-08: qty 20

## 2021-09-08 MED ORDER — CEPHALEXIN 500 MG PO CAPS: 500.0000 mg | ORAL_CAPSULE | Freq: Three times a day (TID) | ORAL | 0 refills | Status: AC

## 2021-09-08 MED ORDER — DOXYCYCLINE HYCLATE 100 MG PO TABS
100.0000 mg | ORAL_TABLET | Freq: Two times a day (BID) | ORAL | 0 refills | Status: AC
Start: 2021-09-08 — End: 2021-09-18

## 2021-09-08 NOTE — Discharge Summary (Signed)
Jacqueline Alvarado, is a 26 y.o. female  DOB 1995/07/22  MRN [id number].  Admission date:  09/06/2021  Admitting Physician  Truett Mainland, DO  Discharge Date:  09/08/2021   Primary MD  Leslie Andrea, MD  Recommendations for primary care physician for things to follow:   1)Please take Cephalexin/Keflex and Doxycycline antibiotics as prescribed starting on Monday 09/09/2021 2) okay to use Diflucan/Fluconazole if you get yeast infection after taking antibiotic 3)Follow-up with primary care physician in a week or so for recheck and reevaluation 4) you have an enlarged fatty liver-----reduce caloric intake, dietary modifications, increased exercise and weight loss advised as we discussed--- repeat liver function test with the primary care physician in 3 to 4 months advised  Admission Diagnosis  Cellulitis of right thigh [L03.115]   Discharge Diagnosis  Cellulitis of right thigh [L03.115]    Principal Problem:   Cellulitis of right thigh Active Problems:   Morbid obesity ([place])   Essential hypertension   Elevated LFTs   Obstructive sleep apnea (adult) (pediatric)      Past Medical History:  Diagnosis Date   Abdominal pain, chronic, right upper quadrant    Abnormal Pap smear of cervix    Anemia    iron deficiency   Anxiety    BPD (bronchopulmonary dysplasia)    Depression    Headache(784.0)    Hypertension    Papanicolaou smear of cervix with positive high risk human papilloma virus (HPV) test 05/31/2019   05/2019 repeat HPV in 1 year    PONV (postoperative nausea and vomiting)    Hx: of nausea only at age 89    Past Surgical History:  Procedure Laterality Date   CHOLECYSTECTOMY N/A 02/02/2017   Procedure: LAPAROSCOPIC CHOLECYSTECTOMY;  Surgeon: Aviva Signs, MD;  Location: [facility];  Service: General;  Laterality: N/A;   COLONOSCOPY N/A 01/07/2016   Dr. Oneida Alar: Nonbleeding internal hemorrhoids,  large. 3 bands successfully placed. Distal ileum containing multiple ulcers which she felt was related to NSAIDs. Biopsies nonspecific.   HEMORRHOID BANDING N/A 01/07/2016   Procedure: HEMORRHOID BANDING;  Surgeon: Danie Binder, MD;  [facility]: [facility];  Service: Endoscopy;  Laterality: N/A;   MASS EXCISION N/A 11/26/2016   Procedure: EXCISION OF ABDOMINAL CICATRIX 4CM;  Surgeon: Aviva Signs, MD;  Location: [facility];  Service: General;  Laterality: N/A;  p knows to arrive at 7:50   NASAL SEPTOPLASTY W/ TURBINOPLASTY Bilateral 06/24/2013   Procedure: TURBINATE REDUCTION;  Surgeon: Jodi Marble, MD;  Location: [facility];  Service: ENT;  Laterality: Bilateral;   TONSILLECTOMY AND ADENOIDECTOMY Bilateral 06/24/2013   Procedure: TONSILLECTOMY ;  Surgeon: Jodi Marble, MD;  Location: [facility];  Service: ENT;  Laterality: Bilateral;   TUMOR EXCISION     Hx: of right side of neck   WISDOM TOOTH EXTRACTION       HPI  from the history and physical done on the day of admission:       HPI: Jacqueline Alvarado is a 26 y.o. female with  medical history significant of obesity, hypertension.  Patient seen for right thigh cellulitis that started a few weeks ago.  Patient has been on 2 rounds of antibiotics.  Both of the time she was on Bactrim.  She started the last course about a week ago and still has 4 days left.  She has been having fevers, chills. The area has been becoming larger and incorporates much of her inner thigh.    Review of Systems: As mentioned in the history of present illness. All other systems reviewed and are negative.     Hospital Course:   Brief Narrative:  26 y.o. female with medical history significant of morbid obesity, hypertension admitted on 09/06/2021 with recurrent right thigh area cellulitis after failing outpatient treatment with Bactrim x2 rounds     -Assessment and Plan:   1) sepsis secondary to right Thigh cellulitis--- POA -Patient met sepsis criteria on admission with  leukocytosis, fevers tachycardia tachypnea  treated with Bactrim about 3 weeks ago treated ago with Bactrim about a week ago WBC 19.5 >>14.5>>12.0 Patient was treated with IV cefepime and vancomycin --CT of Rt Femur--with cellulitis but no abscess  -Sepsis pathophysiology resolved patient afebrile -Blood cultures NGTD -Okay to discharge home on Keflex and doxycycline -   2)Morbid Obesity- -Low calorie diet, portion control and increase physical activity discussed with patient -Body mass index is 50.63 kg/m. -TSH is normal at 3.86   3) fatty liver with elevated LFTs--- acute hepatitis panel negative -Right upper quadrant ultrasound with status post cholecystectomy and enlarged fatty liver -LDL 106 HDL low at 17, total cholesterol 155 with triglycerides of 160 -Diet and lifestyle modifications as advised -Weight loss as advised above   4)HTN--overall stable, continue lisinopril   Disposition--discharge home t    Disposition: The patient is from: Home              Anticipated d/c is to: Home  Discharge Condition: Stable  Follow UP   Follow-up Information     Leslie Andrea, MD. Schedule an appointment as soon as possible for a visit in 1 week(s).   Specialty: Family Medicine Contact information: [address] [place] [address] [phone]                 Diet and Activity recommendation:  As advised  Discharge Instructions    Discharge Instructions     Call MD for:   Complete by: As directed    Call MD for:  hives   Complete by: As directed    Call MD for:  persistant dizziness or light-headedness   Complete by: As directed    Call MD for:  temperature >100.4   Complete by: As directed    Diet - low sodium heart healthy   Complete by: As directed    Discharge instructions   Complete by: As directed    1)Please take Cephalexin/Keflex and Doxycycline antibiotics as prescribed starting on Monday 09/09/2021 2) okay to use Diflucan/Fluconazole  if you get yeast infection after taking antibiotic 3)Follow-up with primary care physician in a week or so for recheck and reevaluation 4) you have an enlarged fatty liver-----reduce caloric intake, dietary modifications, increased exercise and weight loss advised as we discussed--- repeat liver function test with the primary care physician in 3 to 4 months advised   Increase activity slowly   Complete by: As directed          Discharge Medications     Allergies as of 09/08/2021  No Known Allergies      Medication List     STOP taking these medications    citalopram 20 MG tablet Commonly known as: CELEXA   Larin Fe 1/20 1-20 MG-MCG tablet Generic drug: norethindrone-ethinyl estradiol-FE   lisinopril-hydrochlorothiazide 20-12.5 MG tablet Commonly known as: ZESTORETIC   meloxicam 15 MG tablet Commonly known as: Mobic   norethindrone 0.35 MG tablet Commonly known as: MICRONOR   predniSONE 10 MG tablet Commonly known as: DELTASONE   sulfamethoxazole-trimethoprim 800-160 MG tablet Commonly known as: BACTRIM DS       TAKE these medications    cephALEXin 500 MG capsule Commonly known as: Keflex Take 1 capsule (500 mg total) by mouth 3 (three) times daily for 10 days.   doxycycline 100 MG tablet Commonly known as: VIBRA-TABS Take 1 tablet (100 mg total) by mouth 2 (two) times daily for 10 days.   fluconazole 150 MG tablet Commonly known as: Diflucan Take 1 tablet (150 mg total) by mouth once for 1 dose. For yeast infection   lisinopril 10 MG tablet Commonly known as: ZESTRIL Take 10 mg by mouth daily.   oxyCODONE 5 MG immediate release tablet Commonly known as: Roxicodone Take 1 tablet (5 mg total) by mouth every 8 (eight) hours as needed for severe pain.        Major procedures and Radiology Reports - PLEASE review detailed and final reports for all details, in brief -   CT FEMUR RIGHT W CONTRAST  Result Date: 09/06/2021 CLINICAL DATA:  Perianal  abscess or fistula suspected. EXAM: CT OF THE LOWER RIGHT EXTREMITY WITH CONTRAST TECHNIQUE: Multidetector CT imaging of the lower right extremity was performed according to the standard protocol following intravenous contrast administration. RADIATION DOSE REDUCTION: This exam was performed according to the departmental dose-optimization program which includes automated exposure control, adjustment of the mA and/or kV according to patient size and/or use of iterative reconstruction technique. CONTRAST:  169m OMNIPAQUE IOHEXOL 300 MG/ML  SOLN COMPARISON:  None. FINDINGS: Bones/Joint/Cartilage No fracture or dislocation. Normal alignment. No joint effusion. Ligaments Ligaments are suboptimally evaluated by CT. Muscles and Tendons Muscles are normal in bulk and density. No intramuscular hematoma or fluid collection. Soft tissue There is skin thickening and subcutaneous soft tissue edema of the right upper medial thigh and gluteal region. No drainable fluid collection or abscess. The infectious process does not extends to the anal region. No evidence of perianal abscess. IMPRESSION: 1. Skin thickening and marked subcutaneous soft tissue edema about the right upper medial thigh and gluteal region consistent with cellulitis. No drainable fluid collection or abscess. Clinical correlation and continued follow-up is recommended. 2.  No CT evidence of perianal abscess. 3.  No acute osseous abnormality. Electronically Signed   By: IKeane PoliceD.O.   On: 09/06/2021 14:36   UKoreaAbdomen Limited RUQ (LIVER/GB)  Result Date: 09/07/2021 CLINICAL DATA:  Abnormal liver function tests EXAM: ULTRASOUND ABDOMEN LIMITED RIGHT UPPER QUADRANT COMPARISON:  None. FINDINGS: Gallbladder: Not seen consistent with cholecystectomy. Common bile duct: Diameter: 5.7 mm Liver: There is increased echogenicity in the liver. No focal abnormality is seen. Portal vein is patent on color Doppler imaging with normal direction of blood flow towards the  liver. Other: None. IMPRESSION: Status post cholecystectomy.  Enlarged fatty liver. No other sonographic abnormality is seen in the right upper quadrant. Electronically Signed   By: PElmer PickerM.D.   On: 09/07/2021 11:35    Micro Results  Recent Results (from the past 240 hour(s))  Culture, blood (Routine x 2)     Status: None (Preliminary result)   Collection Time: 09/06/21 12:21 PM   Specimen: BLOOD RIGHT HAND  Result Value Ref Range Status   Specimen Description   Final    BLOOD RIGHT HAND BOTTLES DRAWN AEROBIC AND ANAEROBIC   Special Requests Blood Culture adequate volume  Final   Culture   Final    NO GROWTH < 24 HOURS Performed at [facility], [address], [place], [place] [address]    Report Status PENDING  Incomplete  Culture, blood (Routine x 2)     Status: None (Preliminary result)   Collection Time: 09/06/21 12:21 PM   Specimen: BLOOD RIGHT FOREARM  Result Value Ref Range Status   Specimen Description   Final    BLOOD RIGHT FOREARM BOTTLES DRAWN AEROBIC AND ANAEROBIC   Special Requests Blood Culture adequate volume  Final   Culture   Final    NO GROWTH < 24 HOURS Performed at [facility], [address], [place], [place] [address]    Report Status PENDING  Incomplete  Resp Panel by RT-PCR (Flu A&B, Covid) Nasopharyngeal Swab     Status: None   Collection Time: 09/06/21  3:22 PM   Specimen: Nasopharyngeal Swab; Nasopharyngeal(NP) swabs in vial transport medium  Result Value Ref Range Status   SARS Coronavirus 2 by RT PCR NEGATIVE NEGATIVE Final    Comment: (NOTE) SARS-CoV-2 target nucleic acids are NOT DETECTED.  The SARS-CoV-2 RNA is generally detectable in upper respiratory specimens during the acute phase of infection. The lowest concentration of SARS-CoV-2 viral copies this assay can detect is 138 copies/mL. A negative result does not preclude SARS-Cov-2 infection and should not be used as the sole basis for treatment or other patient management  decisions. A negative result may occur with  improper specimen collection/handling, submission of specimen other than nasopharyngeal swab, presence of viral mutation(s) within the areas targeted by this assay, and inadequate number of viral copies(<138 copies/mL). A negative result must be combined with clinical observations, patient history, and epidemiological information. The expected result is Negative.  Fact Sheet for Patients:  EntrepreneurPulse.com.au  Fact Sheet for Healthcare Providers:  IncredibleEmployment.be  This test is no t yet approved or cleared by the [place] FDA and  has been authorized for detection and/or diagnosis of SARS-CoV-2 by FDA under an Emergency Use Authorization (EUA). This EUA will remain  in effect (meaning this test can be used) for the duration of the COVID-19 declaration under Section 564(b)(1) of the Act, 21 U.S.C.section 360bbb-3(b)(1), unless the authorization is terminated  or revoked sooner.       Influenza A by PCR NEGATIVE NEGATIVE Final   Influenza B by PCR NEGATIVE NEGATIVE Final    Comment: (NOTE) The Xpert Xpress SARS-CoV-2/FLU/RSV plus assay is intended as an aid in the diagnosis of influenza from Nasopharyngeal swab specimens and should not be used as a sole basis for treatment. Nasal washings and aspirates are unacceptable for Xpert Xpress SARS-CoV-2/FLU/RSV testing.  Fact Sheet for Patients: EntrepreneurPulse.com.au  Fact Sheet for Healthcare Providers: IncredibleEmployment.be  This test is not yet approved or cleared by the [place] FDA and has been authorized for detection and/or diagnosis of SARS-CoV-2 by FDA under an Emergency Use Authorization (EUA). This EUA will remain in effect (meaning this test can be used) for the duration of the COVID-19 declaration under Section 564(b)(1) of the Act, 21 U.S.C. section 360bbb-3(b)(1), unless the  authorization is terminated or revoked.  Performed at [facility], [address] [place], [place] [address]   MRSA Next Gen by PCR, Nasal     Status: None   Collection Time: 09/06/21  3:37 PM   Specimen: Nasal Mucosa; Nasal Swab  Result Value Ref Range Status   MRSA by PCR Next Gen NOT DETECTED NOT DETECTED Final    Comment: (NOTE) The GeneXpert MRSA Assay (FDA approved for NASAL specimens only), is one component of a comprehensive MRSA colonization surveillance program. It is not intended to diagnose MRSA infection nor to guide or monitor treatment for MRSA infections. Test performance is not FDA approved in patients less than 79 years old. Performed at [facility], [address] [place], [place] [address]   Surgical pcr screen     Status: None   Collection Time: 09/07/21  5:40 AM   Specimen: Nasal Mucosa; Nasal Swab  Result Value Ref Range Status   MRSA, PCR NEGATIVE NEGATIVE Final   Staphylococcus aureus NEGATIVE NEGATIVE Final    Comment: (NOTE) The Xpert SA Assay (FDA approved for NASAL specimens in patients 72 years of age and older), is one component of a comprehensive surveillance program. It is not intended to diagnose infection nor to guide or monitor treatment. Performed at [facility], [address] [place], [place] [address]     Today   [place] today has no new complaints  No fever  Or chills   No Nausea, Vomiting or Diarrhea         Patient has been seen and examined prior to discharge   Objective   Blood pressure 126/70, pulse 64, temperature 98.5 F (36.9 C), temperature source Oral, resp. rate 20, height '5\' 8"'  (1.727 m), weight (!) 151 kg, last menstrual period 08/14/2021, SpO2 100 %.   Intake/Output Summary (Last 24 hours) at 09/08/2021 1241 Last data filed at 09/08/2021 0900 Gross per 24 hour  Intake 3118.59 ml  Output --  Net 3118.59 ml    Exam Gen:- Awake Alert, no acute distress , morbidly obese  appearing HEENT:- [place].AT, No sclera icterus Neck-Supple Neck,No JVD,.  Lungs-  CTAB , good air movement bilaterally CV- S1, S2 normal, regular Abd-  +ve B.Sounds, Abd Soft, No tenderness,    Extremity/Skin:-good pulses, right medial thigh area warmth and swelling, erythema and induration improving Psych-affect is appropriate, oriented x3 Neuro-no new focal deficits, no tremors    Data Review   CBC w Diff:  Lab Results  Component Value Date   WBC 12.0 (H) 09/08/2021   HGB 9.7 (L) 09/08/2021   HCT 31.5 (L) 09/08/2021   PLT 275 09/08/2021   LYMPHOPCT 4 09/06/2021   MONOPCT 5 09/06/2021   EOSPCT 4 09/06/2021   BASOPCT 0 09/06/2021    CMP:  Lab Results  Component Value Date   NA 137 09/08/2021   K 3.8 09/08/2021   CL 105 09/08/2021   CO2 25 09/08/2021   BUN 7 09/08/2021   CREATININE 0.68 09/08/2021   PROT 5.9 (L) 09/08/2021   ALBUMIN 2.3 (L) 09/08/2021   BILITOT 0.7 09/08/2021   ALKPHOS 34 (L) 09/08/2021   AST 20 09/08/2021   ALT 47 (H) 09/08/2021  .  Total Discharge time is about 33 minutes  Roxan Hockey M.D on 09/08/2021 at 12:41 PM  Go to [email or web address] -  for contact info  [facility] - Office  [phone]

## 2021-09-08 NOTE — TOC Progression Note (Signed)
Transition of Care [facility]) - Progression Note    Patient Details  Name: Jacqueline Alvarado MRN: [id number] Date of Birth: 1996-03-02  Transition of [facility]) CM/SW Contact  Karn Cassis, [place] Phone Number: 09/08/2021, 10:37 AM  Clinical Narrative:   Transition of Care (TOC) Screening Note   Patient Details  Name: Jacqueline Alvarado Date of Birth: [birth year]   Transition of Care [facility]) CM/SW Contact:    Karn Cassis, LCSW Phone Number: 09/08/2021, 10:37 AM    Transition of Care Department [facility]) has reviewed patient and no TOC needs have been identified at this time. We will continue to monitor patient advancement through interdisciplinary progression rounds. If new patient transition needs arise, please place a TOC consult.         Barriers to Discharge: Continued Medical Work up  Expected Discharge Plan and Services           Expected Discharge Date: 09/08/21                                     Social Determinants of Health (SDOH) Interventions    Readmission Risk Interventions No flowsheet data found.

## 2021-09-08 NOTE — Discharge Instructions (Signed)
1)Please take Cephalexin/Keflex and Doxycycline antibiotics as prescribed starting on Monday 09/09/2021 2) okay to use Diflucan/Fluconazole if you get yeast infection after taking antibiotic 3)Follow-up with primary care physician in a week or so for recheck and reevaluation 4) you have an enlarged fatty liver-----reduce caloric intake, dietary modifications, increased exercise and weight loss advised as we discussed--- repeat liver function test with the primary care physician in 3 to 4 months advised

## 2021-09-11 LAB — CULTURE, BLOOD (ROUTINE X 2)
Culture: NO GROWTH
Culture: NO GROWTH
Special Requests: ADEQUATE
Special Requests: ADEQUATE

## 2022-01-22 ENCOUNTER — Encounter (HOSPITAL_COMMUNITY): Payer: Self-pay | Admitting: *Deleted

## 2022-01-22 ENCOUNTER — Emergency Department (HOSPITAL_COMMUNITY)
Admission: EM | Admit: 2022-01-22 | Discharge: 2022-01-22 | Disposition: A | Discharge status: Home / Self Care | Payer: 59 | Attending: Emergency Medicine | Admitting: Emergency Medicine

## 2022-01-22 ENCOUNTER — Emergency Department (HOSPITAL_COMMUNITY): Payer: 59

## 2022-01-22 ENCOUNTER — Other Ambulatory Visit: Payer: Self-pay

## 2022-01-22 DIAGNOSIS — X58XXXA Exposure to other specified factors, initial encounter: Secondary | ICD-10-CM | POA: Diagnosis not present

## 2022-01-22 DIAGNOSIS — S29011A Strain of muscle and tendon of front wall of thorax, initial encounter: Secondary | ICD-10-CM | POA: Diagnosis not present

## 2022-01-22 DIAGNOSIS — M545 Low back pain, unspecified: Secondary | ICD-10-CM

## 2022-01-22 DIAGNOSIS — K439 Ventral hernia without obstruction or gangrene: Secondary | ICD-10-CM

## 2022-01-22 DIAGNOSIS — S299XXA Unspecified injury of thorax, initial encounter: Secondary | ICD-10-CM | POA: Diagnosis present

## 2022-01-22 LAB — COMPREHENSIVE METABOLIC PANEL
ALT: 22 U/L (ref 0–44)
AST: 21 U/L (ref 15–41)
Albumin: 3.4 g/dL — ABNORMAL LOW (ref 3.5–5.0)
Alkaline Phosphatase: 28 U/L — ABNORMAL LOW (ref 38–126)
Anion gap: 9 (ref 5–15)
BUN: 8 mg/dL (ref 6–20)
CO2: 27 mmol/L (ref 22–32)
Calcium: 8.6 mg/dL — ABNORMAL LOW (ref 8.9–10.3)
Chloride: 103 mmol/L (ref 98–111)
Creatinine, Ser: 0.78 mg/dL (ref 0.44–1.00)
GFR, Estimated: >60 mL/min (ref >60)
Glucose, Bld: 84 mg/dL (ref 70–99)
Potassium: 3.6 mmol/L (ref 3.5–5.1)
Sodium: 139 mmol/L (ref 135–145)
Total Bilirubin: 0.5 mg/dL (ref 0.3–1.2)
Total Protein: 6.6 g/dL (ref 6.5–8.1)

## 2022-01-22 LAB — CBC WITH DIFFERENTIAL/PLATELET
Abs Immature Granulocytes: 0.03 10*3/uL (ref 0.00–0.07)
Basophils Absolute: 0 10*3/uL (ref 0.0–0.1)
Basophils Relative: 0 %
Eosinophils Absolute: 0.2 10*3/uL (ref 0.0–0.5)
Eosinophils Relative: 2 %
HCT: 36 % (ref 36.0–46.0)
Hemoglobin: 11.3 g/dL — ABNORMAL LOW (ref 12.0–15.0)
Immature Granulocytes: 0 %
Lymphocytes Relative: 31 %
Lymphs Abs: 3.3 10*3/uL (ref 0.7–4.0)
MCH: 23.3 pg — ABNORMAL LOW (ref 26.0–34.0)
MCHC: 31.4 g/dL (ref 30.0–36.0)
MCV: 74.1 fL — ABNORMAL LOW (ref 80.0–100.0)
Monocytes Absolute: 0.7 10*3/uL (ref 0.1–1.0)
Monocytes Relative: 6 %
Neutro Abs: 6.5 10*3/uL (ref 1.7–7.7)
Neutrophils Relative %: 61 %
Platelets: 291 10*3/uL (ref 150–400)
RBC: 4.86 MIL/uL (ref 3.87–5.11)
RDW: 15.4 % (ref 11.5–15.5)
WBC: 10.6 10*3/uL — ABNORMAL HIGH (ref 4.0–10.5)
nRBC: 0 % (ref 0.0–0.2)

## 2022-01-22 LAB — LIPASE, BLOOD: Lipase: 24 U/L (ref 11–51)

## 2022-01-22 LAB — URINALYSIS, ROUTINE W REFLEX MICROSCOPIC
Bilirubin Urine: NEGATIVE
Glucose, UA: NEGATIVE mg/dL
Hgb urine dipstick: NEGATIVE
Ketones, ur: NEGATIVE mg/dL
Leukocytes,Ua: NEGATIVE
Nitrite: NEGATIVE
Protein, ur: NEGATIVE mg/dL
Specific Gravity, Urine: 1.011 (ref 1.005–1.030)
pH: 6 (ref 5.0–8.0)

## 2022-01-22 LAB — PREGNANCY, URINE: Preg Test, Ur: NEGATIVE

## 2022-01-22 MED ORDER — HYDROCODONE-ACETAMINOPHEN 5-325 MG PO TABS: ORAL_TABLET | ORAL | 0 refills | Status: DC

## 2022-01-22 MED ORDER — PREDNISONE 20 MG PO TABS: 40.0000 mg | ORAL_TABLET | Freq: Every day | ORAL | 0 refills | Status: DC

## 2022-01-22 MED ORDER — ONDANSETRON 8 MG PO TBDP
8.0000 mg | ORAL_TABLET | Freq: Once | ORAL | Status: AC
  Administered 2022-01-22: 8 mg via ORAL
  Filled 2022-01-22: qty 1

## 2022-01-22 MED ORDER — OXYCODONE-ACETAMINOPHEN 5-325 MG PO TABS
1.0000 | ORAL_TABLET | Freq: Once | ORAL | Status: AC
  Administered 2022-01-22: 1 via ORAL
  Filled 2022-01-22: qty 1

## 2022-01-22 NOTE — ED Triage Notes (Signed)
Pt in c/o bil intermittent flank pain ongoing for " several months" pt c/o umbilical pain onset today, denies n/v/d, A&O x4

## 2022-01-22 NOTE — ED Notes (Signed)
Pt returned from CT °

## 2022-01-22 NOTE — ED Provider Notes (Signed)
[facility] EMERGENCY DEPARTMENT Provider Note   CSN: [id number] Arrival date & time: 01/22/22  1518     History  Chief Complaint  Patient presents with   Flank Pain    Jacqueline Alvarado is a 26 y.o. female.   Flank Pain Associated symptoms include chest pain and abdominal pain. Pertinent negatives include no shortness of breath.        Jacqueline Alvarado is a 26 y.o. female who presents to the Emergency Department complaining of right flank pain for 2 months.  She was initially seen by PCP for this and treated with medication for sciatica.  She states pain has been persistent, yesterday she began having mid lower abdominal pain and periumbilical pain.  Worse today.  She describes having dysuria symptoms as well.  Increased frequency and pressure to void.  States she is voiding smaller amounts than usual.  She denies any nausea, vomiting, or diarrhea.  No fever or chills.  She denies any abnormal vaginal bleeding or discharge.  No new sexual partners. Pain sometimes radiates to her right thigh, no extremity numbness or weakness She also complains of some mid chest pain that is waxing and waning in severity.  Has been present for weeks to months.  Pain of her chest is aggravated with arm movement.  She denies any shortness of breath.   Home Medications Prior to Admission medications   Medication Sig Start Date End Date Taking? Authorizing Provider  lisinopril (ZESTRIL) 10 MG tablet Take 10 mg by mouth daily. 07/18/21   [provider]  oxyCODONE (ROXICODONE) 5 MG immediate release tablet Take 1 tablet (5 mg total) by mouth every 8 (eight) hours as needed for severe pain. 09/08/21 09/08/22  Shon Hale, MD      Allergies    Patient has no known allergies.    Review of Systems   Review of Systems  Constitutional:  Negative for appetite change, chills and fever.  HENT:  Negative for sore throat.   Respiratory:  Negative for shortness of breath.   Cardiovascular:  Positive for  chest pain. Negative for palpitations and leg swelling.  Gastrointestinal:  Positive for abdominal pain. Negative for diarrhea, nausea and vomiting.  Genitourinary:  Positive for decreased urine volume, dysuria and flank pain. Negative for difficulty urinating, menstrual problem, pelvic pain, vaginal bleeding and vaginal discharge.  Musculoskeletal:  Positive for back pain.  Neurological:  Negative for weakness and numbness.    Physical Exam Updated Vital Signs BP (!) 148/100 (BP Location: Right Arm)   Pulse 75   Temp 99.7 F (37.6 C) (Oral)   Resp 19   LMP 01/18/2022   SpO2 99%  Physical Exam Vitals and nursing note reviewed.  Constitutional:      Appearance: Normal appearance.  Cardiovascular:     Rate and Rhythm: Normal rate and regular rhythm.     Pulses: Normal pulses.  Pulmonary:     Effort: Pulmonary effort is normal. No respiratory distress.     Breath sounds: Normal breath sounds.     Comments: Tenderness palpation over the mid sternum.  No bony deformities or crepitus noted.  No skin changes. Chest:     Chest wall: Tenderness present.  Abdominal:     Palpations: Abdomen is soft.     Tenderness: There is abdominal tenderness. There is no right CVA tenderness, left CVA tenderness or guarding.     Comments: Tenderness to palpation of the periumbilical region.  She has some mild tenderness over the  suprapubic region as well.  Abdomen is soft, no guarding or rebound tenderness.  No CVA tenderness  Musculoskeletal:     Lumbar back: Tenderness present. No bony tenderness. Negative right straight leg raise test and negative left straight leg raise test.     Right lower leg: No edema.     Left lower leg: No edema.  Skin:    General: Skin is warm.     Capillary Refill: Capillary refill takes less than 2 seconds.  Neurological:     General: No focal deficit present.     Mental Status: She is alert.     Sensory: No sensory deficit.     Motor: No weakness.     ED Results  / Procedures / Treatments   Labs (all labs ordered are listed, but only abnormal results are displayed) Labs Reviewed  CBC WITH DIFFERENTIAL/PLATELET - Abnormal; Notable for the following components:      Result Value   WBC 10.6 (*)    Hemoglobin 11.3 (*)    MCV 74.1 (*)    MCH 23.3 (*)    All other components within normal limits  COMPREHENSIVE METABOLIC PANEL - Abnormal; Notable for the following components:   Calcium 8.6 (*)    Albumin 3.4 (*)    Alkaline Phosphatase 28 (*)    All other components within normal limits  URINALYSIS, ROUTINE W REFLEX MICROSCOPIC  PREGNANCY, URINE  LIPASE, BLOOD    EKG None  Radiology CT ABDOMEN PELVIS WO CONTRAST  Result Date: 01/22/2022 CLINICAL DATA:  Right flank and paraumbilical abdominal pain for 3 days. Nausea. EXAM: CT ABDOMEN AND PELVIS WITHOUT CONTRAST TECHNIQUE: Multidetector CT imaging of the abdomen and pelvis was performed following the standard protocol without IV contrast. RADIATION DOSE REDUCTION: This exam was performed according to the departmental dose-optimization program which includes automated exposure control, adjustment of the mA and/or kV according to patient size and/or use of iterative reconstruction technique. COMPARISON:  01/29/2017 FINDINGS: Lower chest: No acute findings. Hepatobiliary: No mass visualized on this unenhanced exam. Prior cholecystectomy. No evidence of biliary obstruction. Pancreas: No mass or inflammatory process visualized on this unenhanced exam. Spleen:  Within normal limits in size. Adrenals/Urinary tract: No evidence of urolithiasis or hydronephrosis. Unremarkable unopacified urinary bladder. Stomach/Bowel: No evidence of obstruction, inflammatory process, or abnormal fluid collections. Normal appendix visualized. Vascular/Lymphatic: No pathologically enlarged lymph nodes identified. No evidence of abdominal aortic aneurysm. Reproductive:  No mass or other significant abnormality. Other: A small midline  epigastric ventral hernia is seen, which contains only fat. Musculoskeletal:  No suspicious bone lesions identified. IMPRESSION: No evidence of urolithiasis, hydronephrosis, or other acute findings. Small epigastric ventral hernia, which contains only fat. Electronically Signed   By: Danae Orleans M.D.   On: 01/22/2022 19:08     Procedures Procedures    Medications Ordered in ED Medications - No data to display  ED Course/ Medical Decision Making/ A&P                           Medical Decision Making Patient here for evaluation of several weeks to months of right-sided flank pain.  Developed periumbilical and suprapubic pain yesterday that has been associated with dysuria symptoms.  Notes increased frequency of urination with voiding small amounts of urine.  Denies any fever or chills.  No abnormal vaginal bleeding or discharge.  No new sexual partners.  On exam, patient well-appearing nontoxic.  She has some mild tenderness suprapubic  and periumbilical regions.  No right lower quadrant tenderness.  No CVA tenderness on exam.  Vitals reviewed.  Mildly hypertensive otherwise reassuring.  She has some substernal chest pain that is reproduced with palpation.  She is PERC negative.  Low clinical suspicion for PE or ACS as her chest pain symptoms have been present for weeks and she does not have shortness of breath or tachycardia.  I suspect flank pain and suprapubic pain may be related to cystitis, versus pyelonephritis.  Could also be MSK.  Kidney stone also considered but felt less likely.  Amount and/or Complexity of Data Reviewed Labs: ordered.    Details: Urinalysis without evidence of infection.  Urine pregnancy test negative.  no significant leukocytosis.  lipase and chemtrisies reassuring Radiology: ordered.    Details: CT abd pelvis w/o acute finding, nml appearing appendix ECG/medicine tests: ordered.    Details: EKG shows sinus arrhythmia with ventricular rate of 55 Discussion of  management or test interpretation with external provider(s): Patient without evidence of urinary tract infection or pregnancy.  Source of patient's symptoms unclear at this time. But most likely MSK.  Chest pain reproducable with palpation and doubt acute cardiac process or PE.  Small ventral hernia seen but doubtful this is source of her current sx's.    On recheck she is felling better. Appears appropriate for d/c home and agrees to close out pt f/u.  Return precautions discussed  Risk Prescription drug management.           Final Clinical Impression(s) / ED Diagnoses Final diagnoses:  Ventral hernia without obstruction or gangrene  Acute right-sided low back pain without sciatica  Muscle strain of chest wall, initial encounter    Rx / DC Orders ED Discharge Orders     None         Kem Parkinson, PA-C [id number] [phone]    Lianne Cure, DO [id number]

## 2022-01-22 NOTE — ED Notes (Signed)
ED Provider at bedside. 

## 2022-01-22 NOTE — Discharge Instructions (Addendum)
Your work-up today was reassuring.  Your right-sided back pain is likely related to sciatica.  You also likely have a muscle strain of the chest wall.  Recommend that you alternate ice and heat to your back and apply ice packs on and off to your chest.  The CT of your abdomen shows that you have a small abdominal wall hernia.    Please follow-up with your primary care provider for recheck.  Return emergency department for any new or worsening symptoms.

## 2022-01-22 NOTE — ED Notes (Signed)
Patient transported to CT 

## 2022-03-27 ENCOUNTER — Telehealth: Payer: 59 | Admitting: Physician Assistant

## 2022-03-27 ENCOUNTER — Encounter: Payer: 59 | Admitting: Physician Assistant

## 2022-03-27 DIAGNOSIS — L700 Acne vulgaris: Secondary | ICD-10-CM

## 2022-03-27 DIAGNOSIS — M549 Dorsalgia, unspecified: Secondary | ICD-10-CM

## 2022-03-27 DIAGNOSIS — L732 Hidradenitis suppurativa: Secondary | ICD-10-CM

## 2022-03-27 NOTE — Progress Notes (Signed)
Because of addition of chronic back pain to previous multiple concerns, you really need to be evaluated in person for a detailed examination and to make sure no imaging of spine is needed and to make sure you get all the proper treatments. If you do have a primary care provider currently you need to call them for assessment. If not or if unavailable, please be seen at one of our in-person urgent cares.    NOTE: There will be NO CHARGE for this eVisit   If you are having a true medical emergency please call 911.      For an urgent face to face visit, [facility] has seven urgent care centers for your convenience:     [facility] Center at [facility] Directions [phone] [address] [place], [place] [address]    [facility] Urgent Care Center [facility]) Get Driving Directions [phone] [address] [place], [place] [address]  [facility] Health Urgent Care Center [facility]) Get Driving Directions [phone] [address] [place],  [place]  [address]  [facility] Urgent Care Center [facility] at [employer] Directions  [phone] [address] [place],  [place] [address]   [facility] Urgent Care at [facility] Get Driving Directions [phone] [address] [place], [place] [address]   [facility] Health Urgent Care at [facility] Get Driving Directions  [phone] [address] [place], [place] [address]   [facility] Health Urgent Care at [facility] Directions [phone] [address] [place], [place] [address]  Your MyChart E-visit questionnaire answers were reviewed by a board certified advanced clinical practitioner to complete your personal care plan based on your specific symptoms.  Thank you for using e-Visits.

## 2022-03-27 NOTE — Telephone Encounter (Signed)
This encounter was created in error - please disregard.

## 2022-03-27 NOTE — Progress Notes (Signed)
   Thank you for the details you included in the comment boxes. Those details are very helpful in determining the best course of treatment for you and help [place] to provide the best care.Because of everything going on currently both with severity of facial and body acne and history of possible hidradenitis with need for further evaluation, we recommend that you convert this visit to a video visit in order for the provider to better assess what is going on.  The provider will be able to give you a more accurate diagnosis and treatment plan if we can more freely discuss your symptoms and with the addition of a virtual examination.   If you convert to a video visit, we will bill your insurance (similar to an office visit) and you will not be charged for this e-Visit. You will be able to stay at home and speak with the first available [facility] advanced practice provider. The link to do a video visit is in the drop down Menu tab of your Welcome screen in MyChart.

## 2022-03-27 NOTE — Progress Notes (Signed)
Erroneous encounter -- patient with several complaints addressed in another evisit -- sent for in person eval.

## 2022-05-06 ENCOUNTER — Other Ambulatory Visit: Payer: Self-pay | Admitting: Adult Health

## 2022-05-12 ENCOUNTER — Ambulatory Visit (INDEPENDENT_AMBULATORY_CARE_PROVIDER_SITE_OTHER): Payer: 59 | Admitting: Adult Health

## 2022-05-12 ENCOUNTER — Other Ambulatory Visit (HOSPITAL_COMMUNITY)
Admission: RE | Admit: 2022-05-12 | Discharge: 2022-05-12 | Disposition: A | Discharge status: Home / Self Care | Payer: 59 | Source: Ambulatory Visit | Attending: Adult Health | Admitting: Adult Health

## 2022-05-12 ENCOUNTER — Encounter: Payer: Self-pay | Admitting: Adult Health

## 2022-05-12 VITALS — BP 143/94 | HR 70 | Ht 68.25 in | Wt 348.0 lb

## 2022-05-12 DIAGNOSIS — M5441 Lumbago with sciatica, right side: Secondary | ICD-10-CM

## 2022-05-12 DIAGNOSIS — L732 Hidradenitis suppurativa: Secondary | ICD-10-CM

## 2022-05-12 DIAGNOSIS — I10 Essential (primary) hypertension: Secondary | ICD-10-CM

## 2022-05-12 DIAGNOSIS — Z0000 Encounter for general adult medical examination without abnormal findings: Secondary | ICD-10-CM

## 2022-05-12 DIAGNOSIS — F419 Anxiety disorder, unspecified: Secondary | ICD-10-CM | POA: Diagnosis not present

## 2022-05-12 DIAGNOSIS — G8929 Other chronic pain: Secondary | ICD-10-CM

## 2022-05-12 DIAGNOSIS — Z8489 Family history of other specified conditions: Secondary | ICD-10-CM | POA: Diagnosis not present

## 2022-05-12 DIAGNOSIS — N926 Irregular menstruation, unspecified: Secondary | ICD-10-CM | POA: Diagnosis not present

## 2022-05-12 DIAGNOSIS — F32A Depression, unspecified: Secondary | ICD-10-CM

## 2022-05-12 DIAGNOSIS — N6322 Unspecified lump in the left breast, upper inner quadrant: Secondary | ICD-10-CM | POA: Diagnosis not present

## 2022-05-12 DIAGNOSIS — Z01419 Encounter for gynecological examination (general) (routine) without abnormal findings: Secondary | ICD-10-CM | POA: Insufficient documentation

## 2022-05-12 DIAGNOSIS — Z23 Encounter for immunization: Secondary | ICD-10-CM | POA: Insufficient documentation

## 2022-05-12 DIAGNOSIS — R69 Illness, unspecified: Secondary | ICD-10-CM | POA: Diagnosis not present

## 2022-05-12 DIAGNOSIS — M5442 Lumbago with sciatica, left side: Secondary | ICD-10-CM | POA: Diagnosis not present

## 2022-05-12 DIAGNOSIS — Z3202 Encounter for pregnancy test, result negative: Secondary | ICD-10-CM | POA: Diagnosis not present

## 2022-05-12 DIAGNOSIS — N644 Mastodynia: Secondary | ICD-10-CM

## 2022-05-12 HISTORY — DX: Family history of other specified conditions: Z84.89

## 2022-05-12 LAB — POCT URINE PREGNANCY: Preg Test, Ur: NEGATIVE

## 2022-05-12 MED ORDER — SULFAMETHOXAZOLE-TRIMETHOPRIM 800-160 MG PO TABS: 1.0000 | ORAL_TABLET | Freq: Two times a day (BID) | ORAL | 1 refills | Status: DC

## 2022-05-12 MED ORDER — NORETHINDRONE 0.35 MG PO TABS: 1.0000 | ORAL_TABLET | Freq: Every day | ORAL | 11 refills | Status: DC

## 2022-05-12 MED ORDER — LISINOPRIL 20 MG PO TABS: 20.0000 mg | ORAL_TABLET | Freq: Every day | ORAL | 6 refills | Status: DC

## 2022-05-12 MED ORDER — SERTRALINE HCL 50 MG PO TABS: 50.0000 mg | ORAL_TABLET | Freq: Every day | ORAL | 6 refills | Status: DC

## 2022-05-12 NOTE — Progress Notes (Signed)
Patient ID: Jacqueline Alvarado, female   DOB: 10-08-1995, 26 y.o.   MRN: [id number] History of Present Illness: Jacqueline Alvarado is a 26 year old black female, with SO, G1P0010, in for well woman gyn exam and pap. She complains pain in left breast and acne, chronic low back pain and irregular periods.  Last pap was LSIL +HPV 11/07/20.  PCP is DR Karie Kirks.   Current Medications, Allergies, Past Medical History, Past Surgical History, Family History and Social History were reviewed in [employer] record.     Review of Systems: Patient denies any headaches, hearing loss, fatigue, blurred vision, shortness of breath, chest pain, abdominal pain, problems with bowel movements, urination, or intercourse. No joint pain or mood swings.  She HPI for positives.    Physical Exam:BP (!) 143/94 (BP Location: Left Arm, Patient Position: Sitting, Cuff Size: Normal)   Pulse 70   Ht 5' 8.25" (1.734 m)   Wt (!) 348 lb (157.9 kg)   LMP 05/09/2022   BMI 52.53 kg/m  UPT is negative  General:  Well developed, well nourished, no acute distress Skin:  Warm and dry Neck:  Midline trachea, normal thyroid, good ROM, no lymphadenopathy Lungs; Clear to auscultation bilaterally Breast:  No dominant palpable mass, retraction, or nipple discharge, on the right, on the left has marble sized mass at 10-11 0' clock 6 FB from nipple,it is tender,no retraction or nipple discharge Cardiovascular: Regular rate and rhythm Abdomen:  Soft, non tender, no hepatosplenomegaly Pelvic:  External genitalia is normal in appearance, has scarring and HS.  The vagina is normal in appearance. Urethra has no lesions or masses. The cervix is bulbous,pap with GC/CHL and HR HPV genotyping performed. Marland Kitchen  Uterus is felt to be normal size, shape, and contour.  No adnexal masses or tenderness noted.Bladder is non tender, no masses felt Extremities/musculoskeletal:  No swelling or varicosities noted, no clubbing or cyanosis Psych:  No mood  changes, alert and cooperative,seems happy AA is 3 Fall risk is low    05/12/2022    2:46 PM 11/07/2020    1:32 PM 05/24/2019    3:52 PM  Depression screen PHQ 2/9  Decreased Interest 3 0 2  Down, Depressed, Hopeless 3 1 2   PHQ - 2 Score 6 1 4   Altered sleeping 3 3 3   Tired, decreased energy 2 3 2   Change in appetite 2 3 3   Feeling bad or failure about yourself  3 1 2   Trouble concentrating 1 2 1   Moving slowly or fidgety/restless 1 1 2   Suicidal thoughts 0 1 0  PHQ-9 Score 18 15 17        05/12/2022    2:46 PM 11/07/2020    1:32 PM  GAD 7 : Generalized Anxiety Score  Nervous, Anxious, on Edge 3 3  Control/stop worrying 3 3  Worry too much - different things 3 3  Trouble relaxing 3 3  Restless 1 1  Easily annoyed or irritable 1 2  Afraid - awful might happen 1 1  Total GAD 7 Score 15 16  She was on Lamictal and stopped, but is starting back and open to SSRI and counseling    Upstream - 05/12/22 1445       Pregnancy Intention Screening   Does the patient want to become pregnant in the next year? No    Does the patient's partner want to become pregnant in the next year? No    Would the patient like to discuss contraceptive options  today? Yes      Contraception Wrap Up   Current Method No Method - Other Reason    End Method Oral Contraceptive    Contraception Counseling Provided Yes    How was the end contraceptive method provided? Prescription            Examination chaperoned by Malachy Mood LPN      Impression and Plan: 1. Encounter for gynecological examination with Papanicolaou smear of cervix Pap sent Pap in 3 years if normal Physical in 1 year  - Cytology - PAP( [facility])  2. Pregnancy examination or test, negative result - POCT urine pregnancy  3. Essential hypertension Will increase lisinopril to 20 mg daily Follow up in 8 weeks for ROS and BP check   Meds ordered this encounter  Medications   lisinopril (ZESTRIL) 20 MG tablet    Sig:  Take 1 tablet (20 mg total) by mouth daily.    Dispense:  30 tablet    Refill:  6    Order Specific Question:   Supervising Provider    Answer:   Duane Lope H [[id number]]   norethindrone (MICRONOR) 0.35 MG tablet    Sig: Take 1 tablet (0.35 mg total) by mouth daily.    Dispense:  28 tablet    Refill:  11    Order Specific Question:   Supervising Provider    Answer:   Duane Lope H [[id number]]   sulfamethoxazole-trimethoprim (BACTRIM DS) 800-160 MG tablet    Sig: Take 1 tablet by mouth 2 (two) times daily. Take 1 bid    Dispense:  28 tablet    Refill:  1    Order Specific Question:   Supervising Provider    Answer:   Despina Hidden, LUTHER H [[id number]]   sertraline (ZOLOFT) 50 MG tablet    Sig: Take 1 tablet (50 mg total) by mouth daily.    Dispense:  30 tablet    Refill:  6    Order Specific Question:   Supervising Provider    Answer:   Despina Hidden, LUTHER H [[id number]]     4. Anxiety and depression Will rx Zoloft and refer to IBH  - Amb ref to Integrated Behavioral Health Follow up in 8 weeks for ROS   5. Irregular periods Will try Micronor due to BP, can start today and use condoms for 1 pack, take same time every day  Will follow up in 8 weeks   6. Breast pain, left Diagnostic mammogram scheduled for 05/21/22 at 10:40 am at [facility]  - US BREAST LTD UNI RIGHT INC AXILLA; Future - MM DIAG BREAST TOMO BILATERAL; Future - US BREAST LTD UNI LEFT INC AXILLA; Future  7. Mass of upper inner quadrant of left breast Diagnostic mammogram scheduled for 05/21/22 at 10:40 am at [facility]  - US BREAST LTD UNI RIGHT INC AXILLA; Future - MM DIAG BREAST TOMO BILATERAL; Future - US BREAST LTD UNI LEFT INC AXILLA; Future  8. Hidradenitis suppurativa Will Rx septra ds   9. Chronic bilateral low back pain with bilateral sciatica Will refer to Dr Romeo Apple  - Ambulatory referral to Orthopedics  10. Family history of neoplasm of breast

## 2022-05-15 LAB — CYTOLOGY - PAP
Chlamydia: NEGATIVE
Comment: NEGATIVE
Comment: NEGATIVE
Comment: NORMAL
Diagnosis: NEGATIVE
Diagnosis: REACTIVE
High risk HPV: NEGATIVE
Neisseria Gonorrhea: NEGATIVE

## 2022-05-16 NOTE — BH Specialist Note (Unsigned)
Integrated Behavioral Health via Telemedicine Visit  05/22/2022 Jacqueline Alvarado [id number]  Number of Integrated Behavioral Health Clinician visits: 1- Initial Visit  Session Start time: 1348   Session End time: 1455  Total time in minutes: 67   Referring Provider: Cyril Mourning, NP Patient/Family location: [facility]  All persons participating in visit: [name] and [facility] Jacqueline Alvarado   Types of Service: Individual psychotherapy and Video visit  I connected with Jacqueline Alvarado and/or Jacqueline Alvarado's  n/a  via  Telephone or Video Enabled Telemedicine Application  (Video is Caregility application) and verified that I am speaking with the correct person using two identifiers. Discussed confidentiality: Yes   I discussed the limitations of telemedicine and the availability of in person appointments.  Discussed there is a possibility of technology failure and discussed alternative modes of communication if that failure occurs.  I discussed that engaging in this telemedicine visit, they consent to the provision of behavioral healthcare and the services will be billed under their insurance.  Patient and/or legal guardian expressed understanding and consented to Telemedicine visit: Yes   Presenting Concerns: Patient and/or family reports the following symptoms/concerns: Increased symptoms of depression and anxiety, stemming from trauma in childhood to present; recent stress over being denied mammogram due to age and concern about possible blood clot in leg; difficulty setting boundaries with others. Pt uses many positive self-coping strategies; goal is to decrease negative coping strategies.  Duration of problem: Began in childhood; health conditions more recent; Severity of problem: severe  Patient and/or Family's Strengths/Protective Factors: Concrete supports in place (healthy food, safe  environments, etc.) and Sense of purpose  Goals Addressed: Patient will:  Reduce symptoms of: anxiety, compulsions, depression, and stress   Increase knowledge and/or ability of: healthy habits   Demonstrate ability to:  Set healthy boundaries  Progress towards Goals: Ongoing  Interventions: Interventions utilized:  Motivational Interviewing and Healthy boundaries Standardized Assessments completed: Not Needed  Patient and/or Family Response: Patient agrees with treatment plan.   Assessment: Patient currently experiencing Mood disorder, unspecified. Further assessment needed.   Patient may benefit from psychoeducation and brief therapeutic interventions regarding coping with symptoms of depression, anxiety, compulsions, life stress .  Plan: Follow up with behavioral health clinician on : Two weeks Behavioral recommendations:  -Continue using self-coping strategies that have been helpful in managing emotions (art, therapy books, journal, singing, dancing, exercising, swimming, walks in nature, cooking, learning new things) -For the next two weeks, practice setting boundaries by saying "no" to at least one person's request daily (write this in journal; will discuss at next visit) Referral(s): Integrated [employer] (In Clinic)  I discussed the assessment and treatment plan with the patient and/or parent/guardian. They were provided an opportunity to ask questions and all were answered. They agreed with the plan and demonstrated an understanding of the instructions.   They were advised to call back or seek an in-person evaluation if the symptoms worsen or if the condition fails to improve as anticipated.  Valetta Close Jadan Rouillard, LCSW     05/12/2022    2:46 PM 11/07/2020    1:32 PM 05/24/2019    3:52 PM 05/24/2019    3:51 PM  Depression screen PHQ 2/9  Decreased Interest 3 0 2 2  Down, Depressed, Hopeless 3 1 2 2   PHQ - 2 Score 6 1 4 4   Altered sleeping 3 3 3  Tired, decreased energy 2 3 2    Change in appetite 2 3 3    Feeling bad or failure about yourself  3 1 2    Trouble concentrating 1 2 1    Moving slowly or fidgety/restless 1 1 2    Suicidal thoughts 0 1 0   PHQ-9 Score 18 15 17        05/12/2022    2:46 PM 11/07/2020    1:32 PM  GAD 7 : Generalized Anxiety Score  Nervous, Anxious, on Edge 3 3  Control/stop worrying 3 3  Worry too much - different things 3 3  Trouble relaxing 3 3  Restless 1 1  Easily annoyed or irritable 1 2  Afraid - awful might happen 1 1  Total GAD 7 Score 15 16

## 2022-05-21 ENCOUNTER — Ambulatory Visit (HOSPITAL_COMMUNITY)
Admission: RE | Admit: 2022-05-21 | Discharge: 2022-05-21 | Disposition: A | Discharge status: Home / Self Care | Payer: 59 | Source: Ambulatory Visit | Attending: Adult Health | Admitting: Adult Health

## 2022-05-21 ENCOUNTER — Encounter (HOSPITAL_COMMUNITY): Payer: Self-pay

## 2022-05-21 ENCOUNTER — Other Ambulatory Visit: Payer: Self-pay

## 2022-05-21 ENCOUNTER — Emergency Department (HOSPITAL_COMMUNITY)
Admission: RE | Admit: 2022-05-21 | Discharge: 2022-05-21 | Disposition: A | Discharge status: Home / Self Care | Payer: 59 | Source: Ambulatory Visit | Attending: Adult Health | Admitting: Adult Health

## 2022-05-21 DIAGNOSIS — N6322 Unspecified lump in the left breast, upper inner quadrant: Secondary | ICD-10-CM

## 2022-05-21 DIAGNOSIS — I10 Essential (primary) hypertension: Secondary | ICD-10-CM | POA: Insufficient documentation

## 2022-05-21 DIAGNOSIS — N644 Mastodynia: Secondary | ICD-10-CM | POA: Insufficient documentation

## 2022-05-21 DIAGNOSIS — Z803 Family history of malignant neoplasm of breast: Secondary | ICD-10-CM | POA: Diagnosis not present

## 2022-05-21 DIAGNOSIS — Z79899 Other long term (current) drug therapy: Secondary | ICD-10-CM | POA: Diagnosis not present

## 2022-05-21 DIAGNOSIS — M7981 Nontraumatic hematoma of soft tissue: Secondary | ICD-10-CM | POA: Insufficient documentation

## 2022-05-21 DIAGNOSIS — M79605 Pain in left leg: Secondary | ICD-10-CM | POA: Diagnosis not present

## 2022-05-21 DIAGNOSIS — R600 Localized edema: Secondary | ICD-10-CM | POA: Diagnosis not present

## 2022-05-21 NOTE — ED Triage Notes (Signed)
Pt states " I think I have a blood clot in my leg." Pt states "I looked up the symptoms online." Pt started taking birth control last week. Noticed swelling today. Pt reports cramping on Monday. Pt reports a bluish bruise on left thigh.   Pt report starting new depression med and antibiotic (for cyst).

## 2022-05-22 ENCOUNTER — Emergency Department (HOSPITAL_COMMUNITY)
Admission: RE | Admit: 2022-05-22 | Discharge: 2022-05-22 | Disposition: A | Discharge status: Home / Self Care | Payer: 59 | Source: Ambulatory Visit | Attending: Emergency Medicine | Admitting: Emergency Medicine

## 2022-05-22 ENCOUNTER — Ambulatory Visit (INDEPENDENT_AMBULATORY_CARE_PROVIDER_SITE_OTHER): Payer: 59 | Admitting: Clinical

## 2022-05-22 ENCOUNTER — Telehealth: Payer: Self-pay | Admitting: Adult Health

## 2022-05-22 ENCOUNTER — Emergency Department (HOSPITAL_COMMUNITY)
Admission: EM | Admit: 2022-05-22 | Discharge: 2022-05-22 | Disposition: A | Discharge status: Home / Self Care | Payer: 59 | Attending: Emergency Medicine | Admitting: Emergency Medicine

## 2022-05-22 DIAGNOSIS — F39 Unspecified mood [affective] disorder: Secondary | ICD-10-CM

## 2022-05-22 DIAGNOSIS — M79605 Pain in left leg: Secondary | ICD-10-CM

## 2022-05-22 DIAGNOSIS — R69 Illness, unspecified: Secondary | ICD-10-CM | POA: Diagnosis not present

## 2022-05-22 DIAGNOSIS — R600 Localized edema: Secondary | ICD-10-CM | POA: Diagnosis not present

## 2022-05-22 NOTE — Telephone Encounter (Signed)
Jacqueline Alvarado reviewed message and chart. Advised to stay on the same meds and once we get results on her tests, will go from there. Pt voiced understanding. JSY

## 2022-05-22 NOTE — ED Provider Notes (Signed)
[facility] EMERGENCY DEPARTMENT Provider Note   CSN: [id number] Arrival date & time: 05/21/22  2128     History  No chief complaint on file.   Jacqueline Alvarado is a 26 y.o. female.  Patient 26 year old female with past medical history of hypertension.  Patient presenting today with complaints of left leg bruising.  She was started on a birth control pill 1 week ago and is concerned she may have a blood clot.  She denies any calf pain.  She denies any chest pain or difficulty breathing.  The history is provided by the patient.       Home Medications Prior to Admission medications   Medication Sig Start Date End Date Taking? Authorizing Provider  lamoTRIgine (LAMICTAL) 25 MG tablet Take 25 mg by mouth daily.    [provider]  lisinopril (ZESTRIL) 20 MG tablet Take 1 tablet (20 mg total) by mouth daily. 05/12/22   Estill Dooms, NP  norethindrone (MICRONOR) 0.35 MG tablet Take 1 tablet (0.35 mg total) by mouth daily. 05/12/22   Estill Dooms, NP  sertraline (ZOLOFT) 50 MG tablet Take 1 tablet (50 mg total) by mouth daily. 05/12/22   Estill Dooms, NP  sulfamethoxazole-trimethoprim (BACTRIM DS) 800-160 MG tablet Take 1 tablet by mouth 2 (two) times daily. Take 1 bid 05/12/22   Derrek Monaco A, NP  VITAMIN D PO Take by mouth.    [provider]      Allergies    Patient has no known allergies.    Review of Systems   Review of Systems  All other systems reviewed and are negative.   Physical Exam Updated Vital Signs BP (!) 142/94   Pulse 76   Temp 98.9 F (37.2 C) (Oral)   Resp 18   Ht _0  (1.727 m)   Wt (!) 154.2 kg   LMP 05/09/2022   SpO2 100%   BMI 51.70 kg/m  Physical Exam Vitals and nursing note reviewed.  Constitutional:      General: She is not in acute distress.    Appearance: Normal appearance. She is not ill-appearing.  HENT:     Head: Normocephalic and atraumatic.  Pulmonary:     Effort: Pulmonary effort is  normal.  Musculoskeletal:     Comments: There is a 3 cm round ecchymotic area to the lateral aspect of the left lower thigh.  There is no calf tenderness and Homans' sign is absent.  DP pulses are palpable and motor and sensation is intact throughout the entire foot.  Skin:    General: Skin is warm and dry.  Neurological:     Mental Status: She is alert and oriented to person, place, and time.     ED Results / Procedures / Treatments   Labs (all labs ordered are listed, but only abnormal results are displayed) Labs Reviewed - No data to display  EKG None  Radiology US BREAST LTD UNI LEFT INC AXILLA  Result Date: 05/21/2022 CLINICAL DATA:  26 year old female presenting for evaluation of a lump and associated pain in the left breast for approximately 6 months. Patient has a family history of breast cancer in her mother in her early 66s. Patient's mother and sister are BRCA positive. Patient herself is not been tested. EXAM: ULTRASOUND OF THE LEFT BREAST COMPARISON:  None available. FINDINGS: On physical exam, at the site of concern reported by the patient in the upper inner left breast I do not feel a discrete mass or  focal area of thickening. Targeted ultrasound is performed at the palpable site of concern in the left breast at 10 o'clock 10 cm from the nipple demonstrating no cystic or solid mass or focal area of shadowing. IMPRESSION: No sonographic imaging abnormality at the palpable site of concern reported by the patient in the upper inner left breast. RECOMMENDATION: 1. Clinical follow-up as needed for the palpable site a left breast given negative imaging findings. 2. Consider genetics assessment to determine the patient's lifetime risk of breast cancer given her family history, if this has not already been performed. 3. Given patient's strong family history and first-degree relatives with BRCA mutation consider beginning annual screening breast MRI at this time and annual screening  mammography at age 5. I have discussed the findings and recommendations with the patient. If applicable, a reminder letter will be sent to the patient regarding the next appointment. BI-RADS CATEGORY  1: Negative. Electronically Signed   By: Audie Pinto M.D.   On: 05/21/2022 12:14    Procedures Procedures    Medications Ordered in ED Medications - No data to display  ED Course/ Medical Decision Making/ A&P  Patient presenting here with bruising to the left leg and concerns that this may be a blood clot.  This appears to be a small bruise/ecchymotic area.  I have little concern for DVT, but will arrange an outpatient ultrasound as they are not here at night.  Patient to be discharged for outpatient imaging.  Final Clinical Impression(s) / ED Diagnoses Final diagnoses:  None    Rx / DC Orders ED Discharge Orders     None         Veryl Speak, MD 05/22/22 [phone]

## 2022-05-22 NOTE — Telephone Encounter (Signed)
Patient states that she went yesterday to er due to thinking she had a blood clot in her leg, was there 7 hours and they told her the ultrasound tech wasn't there at night. She would have to come back in the morning. She states that you ordered her to have bilateral mammogram due to her mother's cancer history and they denied her that because she's not 28 and said they could do a mri. She wants to know if she should continue taking the medicine you put her on or wait til she gets results on what is going on with her legs. Please advise.

## 2022-05-22 NOTE — Discharge Instructions (Signed)
Return to radiology tomorrow at the given time for an ultrasound of your leg to rule out a blood clot.

## 2022-05-22 NOTE — Patient Instructions (Signed)
[facility] at [facility] [address] [place], [place] [address] [phone] (main office) [phone] (Fernanda Twaddell's office)   

## 2022-05-26 NOTE — BH Specialist Note (Signed)
Integrated Behavioral Health via Telemedicine Visit  05/26/2022 Jacqueline Alvarado [id number]  Number of Integrated Behavioral Health Clinician visits: 1- Initial Visit  Session Start time: 1348   Session End time: 1455  Total time in minutes: 67   Referring Provider: Cyril Mourning, NP Patient/Family location: Home [facility] Provider location: [facility]  All persons participating in visit: [name] and [facility] Jacqueline Alvarado   Types of Service: Individual psychotherapy and Video visit  I connected with Jacqueline Alvarado and/or Jacqueline Alvarado's  n/a  via  Telephone or Video Enabled Telemedicine Application  (Video is Caregility application) and verified that I am speaking with the correct person using two identifiers. Discussed confidentiality: Yes   I discussed the limitations of telemedicine and the availability of in person appointments.  Discussed there is a possibility of technology failure and discussed alternative modes of communication if that failure occurs.  I discussed that engaging in this telemedicine visit, they consent to the provision of behavioral healthcare and the services will be billed under their insurance.  Patient and/or legal guardian expressed understanding and consented to Telemedicine visit: Yes   Presenting Concerns: Patient and/or family reports the following symptoms/concerns: Processing feelings regarding practicing setting healthy boundaries the past two weeks with friends and family, complicated family relationships, as well as concern about increasing alcohol consumption the past week to cope. Pt requests additional coping strategy to implement.   Duration of problem: Ongoing; Severity of problem: severe  Patient and/or Family's Strengths/Protective Factors: Concrete supports in place (healthy food, safe environments, etc.) and Sense of purpose  Goals Addressed: Patient will:  Reduce symptoms  of: anxiety, compulsions, depression, and stress   Increase knowledge and/or ability of: self-management skills   Demonstrate ability to: Increase healthy adjustment to current life circumstances, Increase motivation to adhere to plan of care, and Decrease self-medicating behaviors  Progress towards Goals: Ongoing  Interventions: Interventions utilized:  CBT Cognitive Behavioral Therapy and Healthy Boundaries Standardized Assessments completed: Not Needed  Patient and/or Family Response: Patient agrees with treatment plan.   Assessment: Patient currently experiencing Mood disorder, unspecified.   Patient may benefit from continued therapeutic intervention.  Plan: Follow up with behavioral health clinician on : Two weeks Behavioral recommendations:  -Continue setting healthy boundaries with friends and family -Begin Worry Time strategy, as discussed. Start by setting up start and end time reminders on phone today; continue daily for two weeks.  Referral(s): Integrated [employer] (In Clinic)  I discussed the assessment and treatment plan with the patient and/or parent/guardian. They were provided an opportunity to ask questions and all were answered. They agreed with the plan and demonstrated an understanding of the instructions.   They were advised to call back or seek an in-person evaluation if the symptoms worsen or if the condition fails to improve as anticipated.  Valetta Close Lyndzee Kliebert, LCSW     05/12/2022    2:46 PM 11/07/2020    1:32 PM 05/24/2019    3:52 PM 05/24/2019    3:51 PM  Depression screen PHQ 2/9  Decreased Interest 3 0 2 2  Down, Depressed, Hopeless 3 1 2 2   PHQ - 2 Score 6 1 4 4   Altered sleeping 3 3 3    Tired, decreased energy 2 3 2    Change in appetite 2 3 3    Feeling bad or failure about yourself  3 1 2    Trouble concentrating 1 2 1  Moving slowly or fidgety/restless 1 1 2    Suicidal thoughts 0 1 0   PHQ-9 Score 18 15 17         05/12/2022    2:46 PM 11/07/2020    1:32 PM  GAD 7 : Generalized Anxiety Score  Nervous, Anxious, on Edge 3 3  Control/stop worrying 3 3  Worry too much - different things 3 3  Trouble relaxing 3 3  Restless 1 1  Easily annoyed or irritable 1 2  Afraid - awful might happen 1 1  Total GAD 7 Score 15 16

## 2022-06-09 ENCOUNTER — Ambulatory Visit (INDEPENDENT_AMBULATORY_CARE_PROVIDER_SITE_OTHER): Payer: 59 | Admitting: Clinical

## 2022-06-09 DIAGNOSIS — R69 Illness, unspecified: Secondary | ICD-10-CM | POA: Diagnosis not present

## 2022-06-09 DIAGNOSIS — F39 Unspecified mood [affective] disorder: Secondary | ICD-10-CM | POA: Diagnosis not present

## 2022-06-16 NOTE — BH Specialist Note (Signed)
Error; Rescheduled visit for tomorrow due to last-minute work schedule change

## 2022-06-23 ENCOUNTER — Ambulatory Visit: Payer: 59 | Admitting: Clinical

## 2022-06-24 ENCOUNTER — Ambulatory Visit (INDEPENDENT_AMBULATORY_CARE_PROVIDER_SITE_OTHER): Payer: 59 | Admitting: Clinical

## 2022-06-24 DIAGNOSIS — F39 Unspecified mood [affective] disorder: Secondary | ICD-10-CM | POA: Diagnosis not present

## 2022-06-24 DIAGNOSIS — F603 Borderline personality disorder: Secondary | ICD-10-CM

## 2022-06-24 DIAGNOSIS — R69 Illness, unspecified: Secondary | ICD-10-CM | POA: Diagnosis not present
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
## 2022-06-24 NOTE — Patient Instructions (Signed)
[facility] at  [facility] [address] [place],  [address] [phone] (main office) [phone] (Lorain Keast's office)   

## 2022-07-03 ENCOUNTER — Encounter: Payer: Self-pay | Admitting: Orthopaedic Surgery

## 2022-07-03 ENCOUNTER — Ambulatory Visit (INDEPENDENT_AMBULATORY_CARE_PROVIDER_SITE_OTHER): Payer: 59 | Admitting: Orthopaedic Surgery

## 2022-07-03 VITALS — BP 141/86 | HR 64 | Ht 68.0 in | Wt 330.0 lb

## 2022-07-03 DIAGNOSIS — M5441 Lumbago with sciatica, right side: Secondary | ICD-10-CM | POA: Diagnosis not present

## 2022-07-03 DIAGNOSIS — G8929 Other chronic pain: Secondary | ICD-10-CM

## 2022-07-03 DIAGNOSIS — Z6841 Body Mass Index (BMI) 40.0 and over, adult: Secondary | ICD-10-CM

## 2022-07-03 MED ORDER — CYCLOBENZAPRINE HCL 10 MG PO TABS: 10.0000 mg | ORAL_TABLET | Freq: Every day | ORAL | 0 refills | Status: DC
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
## 2022-07-03 NOTE — BH Specialist Note (Signed)
Pt did not arrive to video visit and did not answer the phone; Unable to leave voicemail as mailbox is not set up; left MyChart message for patient.   

## 2022-07-16 ENCOUNTER — Encounter: Payer: Self-pay | Admitting: Adult Health

## 2022-07-16 ENCOUNTER — Ambulatory Visit: Payer: 59 | Admitting: Adult Health

## 2022-07-16 VITALS — BP 124/82 | HR 74 | Ht 68.0 in | Wt 333.5 lb

## 2022-07-16 DIAGNOSIS — R233 Spontaneous ecchymoses: Secondary | ICD-10-CM | POA: Diagnosis not present

## 2022-07-16 DIAGNOSIS — L732 Hidradenitis suppurativa: Secondary | ICD-10-CM | POA: Diagnosis not present

## 2022-07-16 DIAGNOSIS — I10 Essential (primary) hypertension: Secondary | ICD-10-CM

## 2022-07-16 DIAGNOSIS — Z803 Family history of malignant neoplasm of breast: Secondary | ICD-10-CM

## 2022-07-16 MED ORDER — DOXYCYCLINE HYCLATE 100 MG PO TABS: 100.0000 mg | ORAL_TABLET | Freq: Two times a day (BID) | ORAL | 1 refills | Status: DC

## 2022-07-16 NOTE — Progress Notes (Signed)
  Subjective:     Patient ID: Jacqueline Alvarado, female   DOB: Mar 16, 1996, 27 y.o.   MRN: [id number]  HPI Jacqueline Alvarado is a 27 year old black female,single, G1P0010, back in follow up for BP check, after increasing lisinopril to 20 mg on 05/12/22. She is complaining of bruising easily and had bad cramp left leg and has varicose vein there and was seen in ER and normal US venous LLE 05/22/22. She had normal [place] left breast 05/21/22 but she says has lump still that she feels and ?rash on left breast. She says HS flaring too.   Last pap was negative HPV and NILM 05/12/22.  PCP is Dr Karie Kirks.   Review of Systems +easily bruising +HS flare Hx leg cramp Feels breast lump Reviewed past medical,surgical, social and family history. Reviewed medications and allergies.     Objective:   Physical Exam BP 124/82 (BP Location: Right Arm, Patient Position: Sitting, Cuff Size: Normal)   Pulse 74   Ht 5' 8" (1.727 m)   Wt (!) 333 lb 8 oz (151.3 kg)   LMP 07/07/2022 (Approximate)   BMI 50.71 kg/m  has lost 15 lbs since 05/12/22.   Skin warm and dry.  Lungs: clear to ausculation bilaterally. Cardiovascular: regular rate and rhythm. Does having fading bruises on arms.  Breasts:no dominate palpable mass, retraction or nipple discharge, has bilateral regular irregularities. Has varicose vein left thigh, no heat or swelling noted. Fall risk is low  Upstream - 07/16/22 1127       Pregnancy Intention Screening   Does the patient want to become pregnant in the next year? No    Does the patient's partner want to become pregnant in the next year? No    Would the patient like to discuss contraceptive options today? No      Contraception Wrap Up   Current Method Oral Contraceptive    End Method Oral Contraceptive    Contraception Counseling Provided No             Assessment:     1. Essential hypertension Continue lisinopril 20 mg 1 daily, has refills Continue weight loss efforts Will recheck in 8 weeks    2. Hidradenitis suppurativa Will rx doxycycline Meds ordered this encounter  Medications   doxycycline (VIBRA-TABS) 100 MG tablet    Sig: Take 1 tablet (100 mg total) by mouth 2 (two) times daily.    Dispense:  20 tablet    Refill:  1    Order Specific Question:   Supervising Provider    Answer:   Elonda Husky, LUTHER H [[id number]]     3. Family history of breast cancer in first degree relative Mom had breast cancer and has BRCA Gene, and sister has gene  Will refer to genetics for testing Will schedule MR breast at [facility] 07/31/22 at 1 pm  - Ambulatory referral to [place] [facility]; Future  4. Easy bruising +easily bruising  Will check labs  - CBC - TSH - Comprehensive metabolic panel  5. Morbid obesity ([place]) Continue weight loss efforts    Plan:     Follow up in 8 weeks for BP check and ROS

## 2022-07-17 ENCOUNTER — Ambulatory Visit: Payer: 59 | Admitting: Clinical

## 2022-07-17 DIAGNOSIS — Z91199 Patient's noncompliance with other medical treatment and regimen due to unspecified reason: Secondary | ICD-10-CM

## 2022-07-18 DIAGNOSIS — R233 Spontaneous ecchymoses: Secondary | ICD-10-CM | POA: Diagnosis not present

## 2022-07-19 LAB — COMPREHENSIVE METABOLIC PANEL
ALT: 15 IU/L (ref 0–32)
AST: 15 IU/L (ref 0–40)
Albumin/Globulin Ratio: 1.3 (ref 1.2–2.2)
Albumin: 3.7 g/dL — ABNORMAL LOW (ref 4.0–5.0)
Alkaline Phosphatase: 36 IU/L — ABNORMAL LOW (ref 44–121)
BUN/Creatinine Ratio: 21 (ref 9–23)
BUN: 15 mg/dL (ref 6–20)
Bilirubin Total: <0.2 mg/dL (ref 0.0–1.2)
CO2: 21 mmol/L (ref 20–29)
Calcium: 9.4 mg/dL (ref 8.7–10.2)
Chloride: 104 mmol/L (ref 96–106)
Creatinine, Ser: 0.73 mg/dL (ref 0.57–1.00)
Globulin, Total: 2.9 g/dL (ref 1.5–4.5)
Glucose: 87 mg/dL (ref 70–99)
Potassium: 4.1 mmol/L (ref 3.5–5.2)
Sodium: 139 mmol/L (ref 134–144)
Total Protein: 6.6 g/dL (ref 6.0–8.5)
eGFR: 115 mL/min/{1.73_m2} (ref >59)

## 2022-07-19 LAB — CBC
Hematocrit: 37.6 % (ref 34.0–46.6)
Hemoglobin: 11.4 g/dL (ref 11.1–15.9)
MCH: 22.6 pg — ABNORMAL LOW (ref 26.6–33.0)
MCHC: 30.3 g/dL — ABNORMAL LOW (ref 31.5–35.7)
MCV: 75 fL — ABNORMAL LOW (ref 79–97)
Platelets: 336 10*3/uL (ref 150–450)
RBC: 5.05 x10E6/uL (ref 3.77–5.28)
RDW: 15.7 % — ABNORMAL HIGH (ref 11.7–15.4)
WBC: 9.2 10*3/uL (ref 3.4–10.8)

## 2022-07-19 LAB — TSH: TSH: 0.942 u[IU]/mL (ref 0.450–4.500)

## 2022-07-21 ENCOUNTER — Other Ambulatory Visit: Payer: Self-pay | Admitting: Adult Health

## 2022-07-21 DIAGNOSIS — R233 Spontaneous ecchymoses: Secondary | ICD-10-CM

## 2022-07-21 DIAGNOSIS — R718 Other abnormality of red blood cells: Secondary | ICD-10-CM

## 2022-07-30 ENCOUNTER — Ambulatory Visit (HOSPITAL_COMMUNITY): Payer: 59 | Attending: Orthopaedic Surgery | Admitting: Physical Therapy

## 2022-07-30 ENCOUNTER — Encounter (HOSPITAL_COMMUNITY): Payer: Self-pay | Admitting: Physical Therapy

## 2022-07-30 DIAGNOSIS — M5441 Lumbago with sciatica, right side: Secondary | ICD-10-CM | POA: Insufficient documentation

## 2022-07-30 DIAGNOSIS — M5459 Other low back pain: Secondary | ICD-10-CM | POA: Insufficient documentation

## 2022-07-30 DIAGNOSIS — G8929 Other chronic pain: Secondary | ICD-10-CM | POA: Diagnosis not present

## 2022-07-30 NOTE — Therapy (Signed)
OUTPATIENT PHYSICAL THERAPY THORACOLUMBAR EVALUATION   Patient Name: Jacqueline Alvarado MRN: [id number] DOB:Apr 28, 1996, 27 y.o., female Today's Date: 07/30/2022  END OF SESSION:  PT End of Session - 07/30/22 1431     Visit Number 1    Number of Visits 12    Date for PT Re-Evaluation 09/10/22    Authorization Type [employer]    Progress Note Due on Visit 10    PT Start Time 1345    PT Stop Time 1430    PT Time Calculation (min) 45 min    Activity Tolerance Patient tolerated treatment well    Behavior During Therapy WFL for tasks assessed/performed             Past Medical History:  Diagnosis Date   Abdominal pain, chronic, right upper quadrant    Abnormal Pap smear of cervix    Anemia    iron deficiency   Anxiety    BPD (bronchopulmonary dysplasia)    Depression    Family history of neoplasm of breast 05/12/2022   Headache(784.0)    Hypertension    Papanicolaou smear of cervix with positive high risk human papilloma virus (HPV) test 05/31/2019   05/2019 repeat HPV in 1 year    PONV (postoperative nausea and vomiting)    Hx: of nausea only at age 35   Past Surgical History:  Procedure Laterality Date   CHOLECYSTECTOMY N/A 02/02/2017   Procedure: LAPAROSCOPIC CHOLECYSTECTOMY;  Surgeon: Aviva Signs, MD;  Location: [facility];  Service: General;  Laterality: N/A;   COLONOSCOPY N/A 01/07/2016   Dr. Oneida Alar: Nonbleeding internal hemorrhoids, large. 3 bands successfully placed. Distal ileum containing multiple ulcers which she felt was related to NSAIDs. Biopsies nonspecific.   HEMORRHOID BANDING N/A 01/07/2016   Procedure: HEMORRHOID BANDING;  Surgeon: Danie Binder, MD;  Location: [facility];  Service: Endoscopy;  Laterality: N/A;   MASS EXCISION N/A 11/26/2016   Procedure: EXCISION OF ABDOMINAL CICATRIX 4CM;  Surgeon: Aviva Signs, MD;  Location: [facility];  Service: General;  Laterality: N/A;  p knows to arrive at 7:50   NASAL SEPTOPLASTY W/ TURBINOPLASTY Bilateral 06/24/2013    Procedure: TURBINATE REDUCTION;  Surgeon: Jodi Marble, MD;  Location: [place];  Service: ENT;  Laterality: Bilateral;   TONSILLECTOMY AND ADENOIDECTOMY Bilateral 06/24/2013   Procedure: TONSILLECTOMY ;  Surgeon: Jodi Marble, MD;  Location: [facility] OR;  Service: ENT;  Laterality: Bilateral;   TUMOR EXCISION     Hx: of right side of neck   WISDOM TOOTH EXTRACTION     Patient Active Problem List   Diagnosis Date Noted   Breast pain, left 05/12/2022   Irregular periods 05/12/2022   Anxiety and depression 05/12/2022   Pregnancy examination or test, negative result 05/12/2022   Encounter for gynecological examination with Papanicolaou smear of cervix 05/12/2022   Mass of upper inner quadrant of left breast 05/12/2022   Hidradenitis suppurativa 05/12/2022   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   Family history of neoplasm of breast 05/12/2022   Cellulitis of right thigh 09/06/2021   Elevated LFTs 09/06/2021   Essential hypertension 11/07/2020   Papanicolaou smear of cervix with positive high risk human papilloma virus (HPV) test 05/31/2019   Menorrhagia with irregular cycle 05/24/2019   Acute cholecystitis 02/01/2017   Calculus of gallbladder with acute cholecystitis without obstruction    Biliary colic    Abdominal pain 01/29/2017   Gallstones    Inflammation of the retroperitoneum    Cicatrix  Rectal bleeding 12/20/2015   Hemorrhoids 12/20/2015   Hypertrophy of nasal turbinates 06/24/2013   Tonsillar hypertrophy 06/24/2013   Obstructive sleep apnea (adult) (pediatric) 06/24/2013   Morbid obesity (HCC) 06/24/2013    PCP: John Giovanni MD  REFERRING PROVIDER: Darreld Mclean, MD  REFERRING DIAG: [phone] (ICD-10-CM) - Chronic bilateral low back pain with right-sided sciatica  Rationale for Evaluation and Treatment: Rehabilitation  THERAPY DIAG:  Other low back pain  ONSET DATE: 2020  SUBJECTIVE:                                                                                                                                                                                            SUBJECTIVE STATEMENT: Patient presents to therapy with complaint of ongoing LBP and RT knee pain. Issues have become progressively worse. She has had xrays which were negative.   PERTINENT HISTORY:  NA  PAIN:  Are you having pain? Yes: NPRS scale: 4-5/10 Pain location: low back, RT knee  Pain description: pressure, strain, sharp; RT knee 5/10 throbbing, aching Aggravating factors: sitting, walking, standing, stairs, prolonged positions Relieving factors: "nothing really"   PRECAUTIONS: None  WEIGHT BEARING RESTRICTIONS: No  FALLS:  Has patient fallen in last 6 months? No  LIVING ENVIRONMENT: Lives with: lives alone Lives in: House/apartment Stairs: No Has following equipment at home: None  OCCUPATION: Live in care giver   PLOF: Independent  PATIENT GOALS: "Be able to use all of my limbs as God intended"  NEXT MD VISIT:   OBJECTIVE:   DIAGNOSTIC FINDINGS:  Back and knee xrays negative   PATIENT SURVEYS:  FOTO 47% function   COGNITION: Overall cognitive status: Within functional limits for tasks assessed     SENSATION: Decreased sensation to light touch about LT lower arm   POSTURE: rounded shoulders  PALPATION:  Mod TTP about RT lower lumbar paraspinal   LUMBAR ROM:   AROM eval  Flexion Full P!  Extension 25 % limited P!  Right lateral flexion Full  Left lateral flexion Full  Right rotation Full P!  Left rotation Full P!   (Blank rows = not tested)  LOWER EXTREMITY ROM:     Active  Right eval Left eval  Hip flexion    Hip extension    Hip abduction    Hip adduction    Hip internal rotation    Hip external rotation    Knee flexion 125 125  Knee extension 5 2  Ankle dorsiflexion    Ankle plantarflexion    Ankle inversion    Ankle eversion     (Blank rows = not tested)  LOWER EXTREMITY  MMT:     MMT Right eval Left eval  Hip flexion 4+ 4+  Hip extension 3- 4  Hip abduction 5 4+  Hip adduction    Hip internal rotation    Hip external rotation    Knee flexion    Knee extension 5 5  Ankle dorsiflexion 5 5  Ankle plantarflexion    Ankle inversion    Ankle eversion     (Blank rows = not tested)  LUMBAR SPECIAL TESTS:  Straight leg raise test: Positive (LLE)   TODAY'S TREATMENT:                                                                                                                              DATE:  07/30/22 Eval    PATIENT EDUCATION:  Education details: on eval finding, and POC Person educated: Patient Education method: Explanation Education comprehension: verbalized understanding  HOME EXERCISE PROGRAM: Issue next session   ASSESSMENT:  CLINICAL IMPRESSION: Patient is a 27 y.o. female who presents to physical therapy with complaint of LBP and RT knee pain. Patient demonstrates muscle weakness, reduced ROM, and fascial restrictions which are likely contributing to symptoms of pain and are negatively impacting patient ability to perform ADLs and functional mobility tasks. Patient will benefit from skilled physical therapy services to address these deficits to reduce pain and improve level of function with ADLs and functional mobility tasks.   OBJECTIVE IMPAIRMENTS: Abnormal gait, decreased activity tolerance, decreased mobility, difficulty walking, decreased ROM, decreased strength, increased fascial restrictions, improper body mechanics, postural dysfunction, and pain.   ACTIVITY LIMITATIONS: carrying, lifting, bending, sitting, standing, squatting, stairs, transfers, and locomotion level  PARTICIPATION LIMITATIONS: meal prep, cleaning, laundry, shopping, community activity, occupation, yard work, and school  PERSONAL FACTORS:  NA  are also affecting patient's functional outcome.   REHAB POTENTIAL: Good  CLINICAL DECISION MAKING:  Stable/uncomplicated  EVALUATION COMPLEXITY: Low   GOALS: SHORT TERM GOALS: Target date: 08/20/2022  Patient will be independent with initial HEP and self-management strategies to improve functional outcomes Baseline:  Goal status: INITIAL   LONG TERM GOALS: Target date: 09/10/2022  Patient will be independent with advanced HEP and self-management strategies to improve functional outcomes Baseline:  Goal status: INITIAL  2.  Patient will improve FOTO score to predicted value to indicate improvement in functional outcomes Baseline: 47% Goal status: INITIAL  3.  Patient will report reduction of back pain to <3/10 for improved quality of life and ability to perform ADLs  Baseline: 5/10 Goal status: INITIAL  4. Patient will have equal to or > 4+/5 MMT throughout BLE to improve ability to perform functional mobility, stair ambulation and ADLs.  Baseline: See MMT Goal status: INITIAL PLAN:  PT FREQUENCY: 1-2x/week  PT DURATION: 6 weeks  PLANNED INTERVENTIONS: Therapeutic exercises, Therapeutic activity, Neuromuscular re-education, Balance training, Gait training, Patient/Family education, Joint manipulation, Joint mobilization, Stair training, Aquatic Therapy, Dry Needling, Electrical stimulation, Spinal manipulation, Spinal mobilization, Cryotherapy, Moist heat,  scar mobilization, Taping, Traction, Ultrasound, Biofeedback, Ionotophoresis 4mg /ml Dexamethasone, and Manual therapy. Marland Kitchen  PLAN FOR NEXT SESSION: Progress glute, quad and core strengthening as tolerated. Weekly HEP updates.  2:32 PM, 07/30/22 Josue Hector PT DPT  Physical Therapist with [facility]  [phone]

## 2022-07-31 ENCOUNTER — Ambulatory Visit (HOSPITAL_COMMUNITY): Payer: 59

## 2022-07-31 ENCOUNTER — Encounter (HOSPITAL_COMMUNITY): Payer: Self-pay

## 2022-07-31 ENCOUNTER — Ambulatory Visit (HOSPITAL_COMMUNITY): Admission: RE | Admit: 2022-07-31 | Payer: 59 | Source: Ambulatory Visit

## 2022-08-02 IMAGING — DX DG HAND COMPLETE 3+V*L*
3 series · 3 of 3 positions shown · non-contrast
Comparison: None.

CLINICAL DATA: Chronic pain

EXAM:
LEFT HAND - COMPLETE 3+ VIEW

[hand ap]
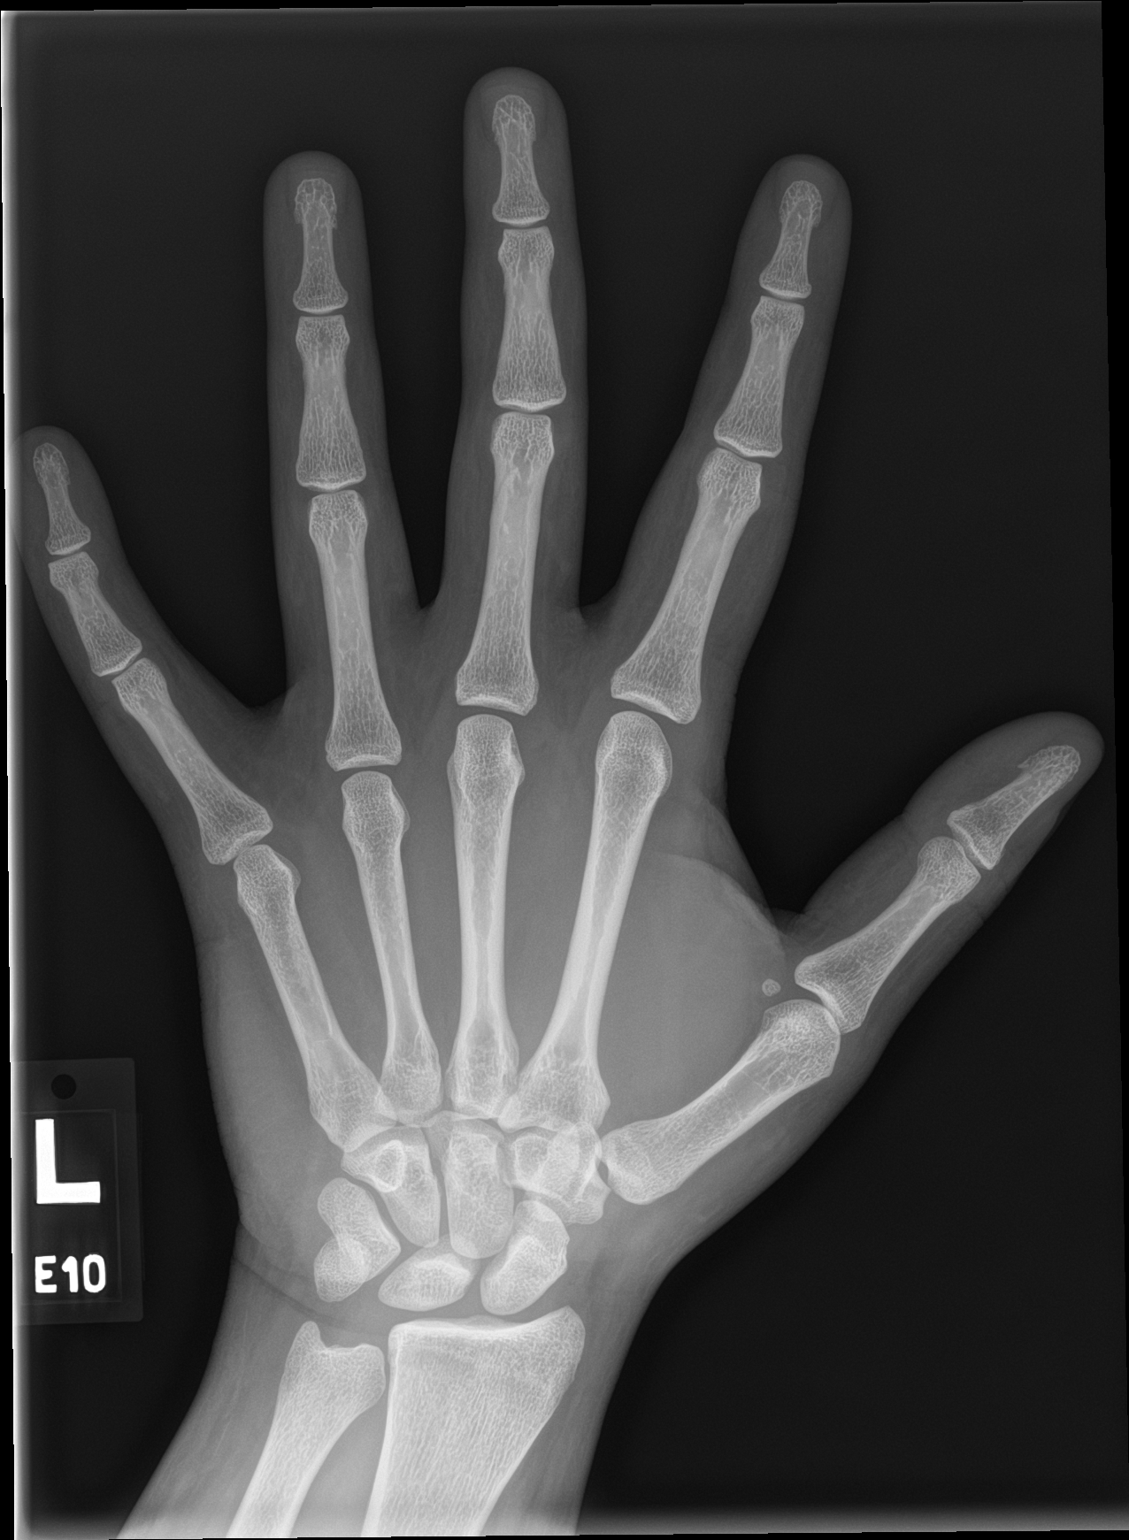

[hand obl]
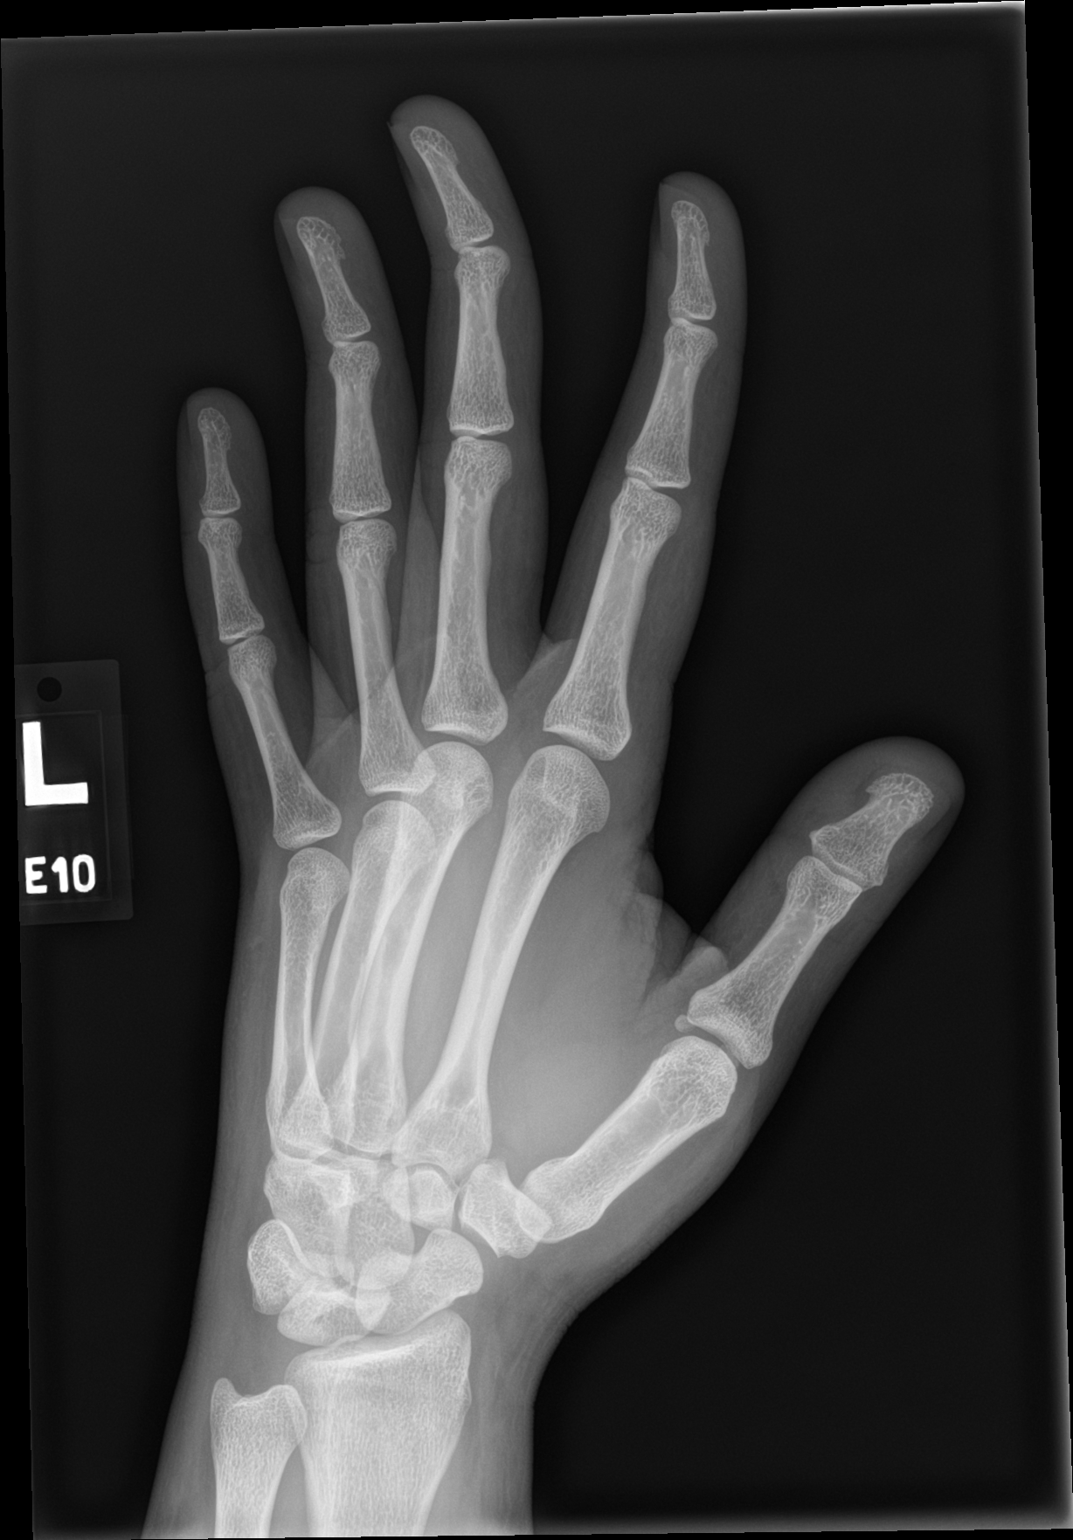

[hand lat]
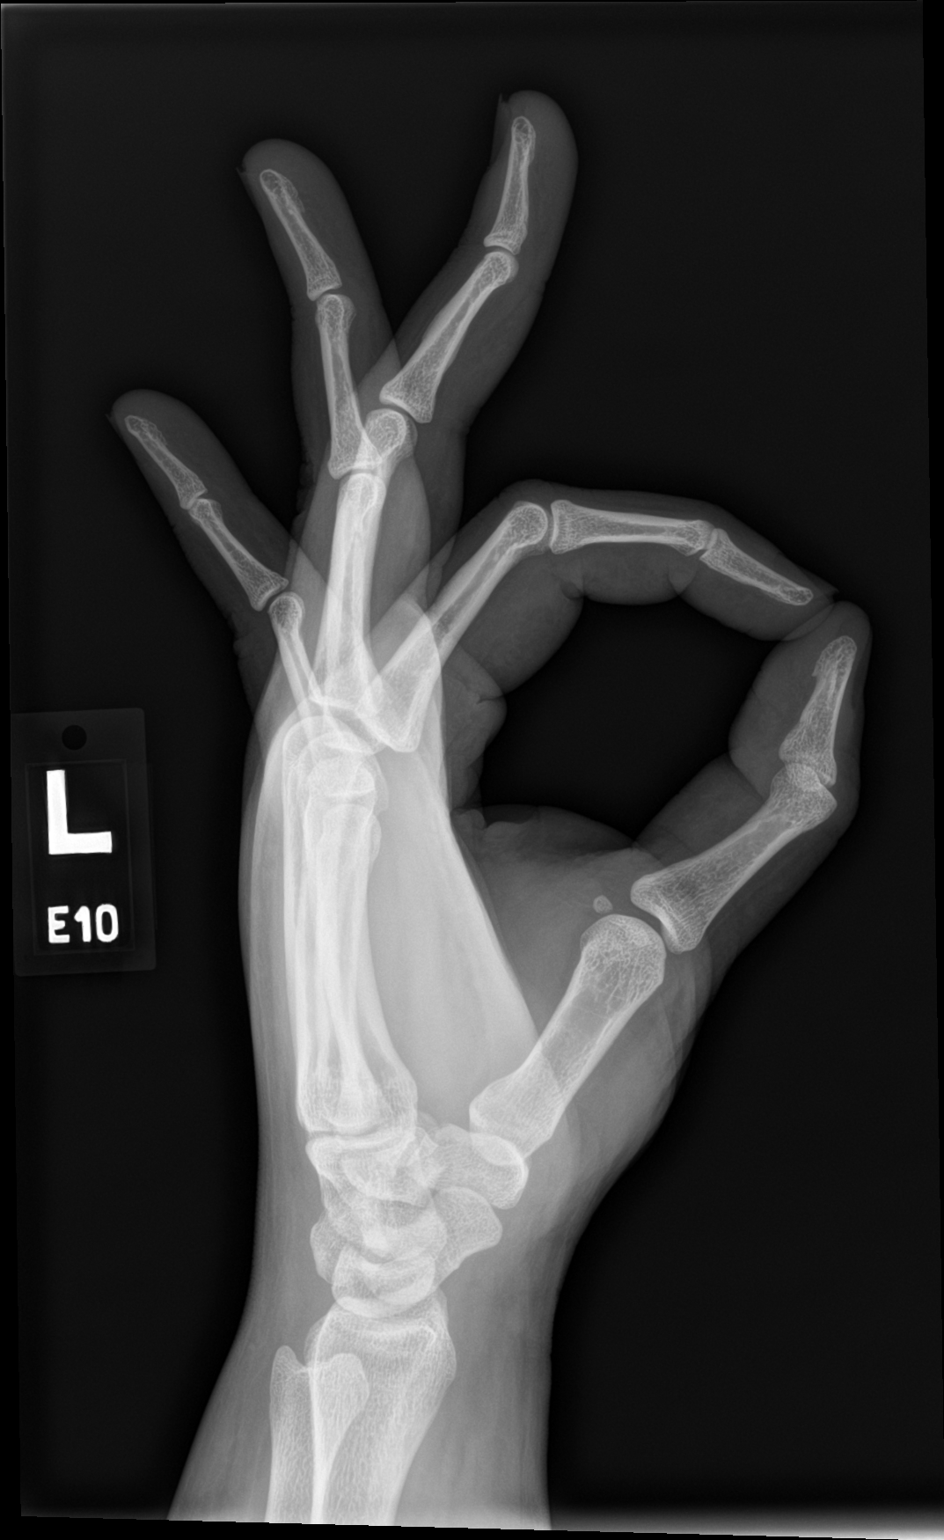

[3 of 3 positions shown; findings below may reference images not displayed]

FINDINGS: No fracture. Minimal radial subluxation base of fifth distal phalanx
with respect to middle phalanx without frank dislocation. Joint
space is patent.
IMPRESSION: Minimal radial displacement of the base of the fifth distal phalanx.

## 2022-08-04 ENCOUNTER — Ambulatory Visit (INDEPENDENT_AMBULATORY_CARE_PROVIDER_SITE_OTHER): Payer: 59 | Admitting: Clinical

## 2022-08-04 DIAGNOSIS — R69 Illness, unspecified: Secondary | ICD-10-CM | POA: Diagnosis not present

## 2022-08-04 DIAGNOSIS — F39 Unspecified mood [affective] disorder: Secondary | ICD-10-CM | POA: Diagnosis not present

## 2022-08-04 DIAGNOSIS — Z803 Family history of malignant neoplasm of breast: Secondary | ICD-10-CM

## 2022-08-04 NOTE — BH Specialist Note (Signed)
Integrated Behavioral Health via Telemedicine Visit  08/04/2022 Jacqueline Alvarado [id number]  Number of Integrated Behavioral Health Clinician visits: 4- Fourth Visit  Session Start time: 0915   Session End time: 0949  Total time in minutes: 34   Referring Provider: Derrek Monaco, NP Patient/Family location: Home [facility] Provider location: [facility] [place] [facility]  All persons participating in visit: [name] and [place]   Types of Service: Individual psychotherapy  I connected with [place] and/or Elliett S Augello's  n/a  via  Telephone or Video Enabled Telemedicine Application  (Video is Caregility application) and verified that I am speaking with the correct person using two identifiers. Discussed confidentiality: Yes   I discussed the limitations of telemedicine and the availability of in person appointments.  Discussed there is a possibility of technology failure and discussed alternative modes of communication if that failure occurs.  I discussed that engaging in this telemedicine visit, they consent to the provision of behavioral healthcare and the services will be billed under their insurance.  Patient and/or legal guardian expressed understanding and consented to Telemedicine visit: Yes   Presenting Concerns: Patient and/or family reports the following symptoms/concerns: Processing feelings regarding recent physical and emotional pain (possible ending of romantic relationship, supporting friends while feeling unsupported herself), stopped taking Lexapro, poor sleep and appetite, life stress. Pt has coped by using newly acquired cable as a healthy distraction/escape.  Duration of problem: Increasing over time; Severity of problem:  moderately severe  Patient and/or Family's Strengths/Protective Factors: Social connections, Concrete supports in place (healthy food, safe environments, etc.), and Sense of  purpose  Goals Addressed: Patient will:  Reduce symptoms of: anxiety, depression, insomnia, mood instability, and stress   Increase knowledge and/or ability of: stress reduction   Demonstrate ability to: Increase healthy adjustment to current life circumstances and Increase motivation to adhere to plan of care  Progress towards Goals: Ongoing  Interventions: Interventions utilized:  Motivational Interviewing and Supportive Reflection Standardized Assessments completed: Not Needed  Patient and/or Family Response: Patient agrees with treatment plan.   Assessment: Patient currently experiencing Mood disorder, unspecified.   Patient may benefit from continued therapeutic interventions.  Plan: Follow up with behavioral health clinician on : Two weeks Behavioral recommendations:  -Consider starting back on Lexapro as prescribed (remember sleep was better while taking); discuss with PCP -Continue using self-coping strategies to manage emotions (healthy boundaries, worry time, relaxation breathing, etc) daily -Continue plan to watch show of choice in the evening as part of healthy self-care during this time -Continue plan to begin physical therapy soon; upcoming MRI; discuss physical symptoms with PCP and specialists (numbness in arm)  Referral(s): Integrated Orthoptist (In Clinic) and [place] (LME/Outside Clinic)  I discussed the assessment and treatment plan with the patient and/or parent/guardian. They were provided an opportunity to ask questions and all were answered. They agreed with the plan and demonstrated an understanding of the instructions.   They were advised to call back or seek an in-person evaluation if the symptoms worsen or if the condition fails to improve as anticipated.  Caroleen Hamman Leevi Cullars, LCSW

## 2022-08-05 ENCOUNTER — Ambulatory Visit: Payer: 59 | Admitting: Orthopaedic Surgery

## 2022-08-05 NOTE — BH Specialist Note (Signed)
Integrated Behavioral Health via Telemedicine Visit  08/18/2022 ZONIA CAPLIN [id number]  Number of Integrated Behavioral Health Clinician visits: 5-Fifth Visit  Session Start time: 9563   Session End time: 1633  Total time in minutes: 27   Referring Provider: Derrek Monaco, NP Patient/Family location: Home [facility] Provider location: [facility] [place] [facility]  All persons participating in visit: [name] and [place]   Types of Service: Individual psychotherapy and Video visit  I connected with [employer] and/or Defne S Duffner's  n/a  via  Telephone or Video Enabled Telemedicine Application  (Video is Caregility application) and verified that I am speaking with the correct person using two identifiers. Discussed confidentiality: Yes   I discussed the limitations of telemedicine and the availability of in person appointments.  Discussed there is a possibility of technology failure and discussed alternative modes of communication if that failure occurs.  I discussed that engaging in this telemedicine visit, they consent to the provision of behavioral healthcare and the services will be billed under their insurance.  Patient and/or legal guardian expressed understanding and consented to Telemedicine visit: Yes   Presenting Concerns: Patient and/or family reports the following symptoms/concerns: Recent sickness (covid positive), conflict with and loss of a friendship, loss of romantic relationship, work stress, and caring for a stray cat that was found to belong to another family; recognizing a greater need for increased self-care after losses (above) for physical and emotional healing.  Duration of problem: Increased stress the past two weeks; Severity of problem:  moderately severe  Patient and/or Family's Strengths/Protective Factors: Social connections, Concrete supports in place (healthy food, safe environments, etc.),  and Sense of purpose  Goals Addressed: Patient will:  Reduce symptoms of: anxiety, depression, mood instability, and stress    Demonstrate ability to: Increase healthy adjustment to current life circumstances  Progress towards Goals: Ongoing  Interventions: Interventions utilized:  Armed forces logistics/support/administrative officer and Supportive Reflection Standardized Assessments completed: Not Needed  Patient and/or Family Response: Patient agrees with treatment plan.   Assessment: Patient currently experiencing Mood disorder, unspecified.   Patient may benefit from continued therapeutic interventions.  Plan: Follow up with behavioral health clinician on : Two weeks Behavioral recommendations:  -Continue taking Zoloft as prescribed -Continue prioritizing healthy self care (routine meals, adequate rest and fluids; yoga; saying "yes" only to things that are helping with either physical or emotional healing during this time).  -Be kind to yourself in the midst of personal losses (loss of health/wellness with covid, loss of friendship, loss of romantic partner, loss of pet) Referral(s): [place] (In Clinic)  I discussed the assessment and treatment plan with the patient and/or parent/guardian. They were provided an opportunity to ask questions and all were answered. They agreed with the plan and demonstrated an understanding of the instructions.   They were advised to call back or seek an in-person evaluation if the symptoms worsen or if the condition fails to improve as anticipated.  [place], LCSW     05/12/2022    2:46 PM 11/07/2020    1:32 PM 05/24/2019    3:52 PM 05/24/2019    3:51 PM  Depression screen PHQ 2/9  Decreased Interest 3 0 2 2  Down, Depressed, Hopeless 3 1 2 2   PHQ - 2 Score 6 1 4 4   Altered sleeping 3 3 3    Tired, decreased energy 2 3 2    Change in appetite 2 3  3   Feeling bad or failure about yourself  3 1 2    Trouble concentrating 1 2 1     Moving slowly or fidgety/restless 1 1 2    Suicidal thoughts 0 1 0   PHQ-9 Score 18 15 17        05/12/2022    2:46 PM 11/07/2020    1:32 PM  GAD 7 : Generalized Anxiety Score  Nervous, Anxious, on Edge 3 3  Control/stop worrying 3 3  Worry too much - different things 3 3  Trouble relaxing 3 3  Restless 1 1  Easily annoyed or irritable 1 2  Afraid - awful might happen 1 1  Total GAD 7 Score 15 16

## 2022-08-13 ENCOUNTER — Ambulatory Visit (HOSPITAL_COMMUNITY): Payer: 59 | Admitting: Physical Therapy

## 2022-08-13 ENCOUNTER — Encounter (HOSPITAL_COMMUNITY): Payer: Self-pay | Admitting: Physical Therapy

## 2022-08-13 DIAGNOSIS — M5459 Other low back pain: Secondary | ICD-10-CM | POA: Diagnosis not present

## 2022-08-13 DIAGNOSIS — G8929 Other chronic pain: Secondary | ICD-10-CM | POA: Diagnosis not present

## 2022-08-13 DIAGNOSIS — M5441 Lumbago with sciatica, right side: Secondary | ICD-10-CM | POA: Diagnosis not present

## 2022-08-13 NOTE — Therapy (Signed)
OUTPATIENT PHYSICAL THERAPY TREATMENT   Patient Name: Jacqueline Alvarado MRN: [id number] DOB:Jun 25, 1996, 27 y.o., female Today's Date: 08/13/2022  END OF SESSION:  PT End of Session - 08/13/22 1439     Visit Number 2    Number of Visits 12    Date for PT Re-Evaluation 09/10/22    Authorization Type [employer]    Progress Note Due on Visit 10    PT Start Time 1439    PT Stop Time 1520    PT Time Calculation (min) 41 min    Activity Tolerance Patient tolerated treatment well    Behavior During Therapy WFL for tasks assessed/performed             Past Medical History:  Diagnosis Date   Abdominal pain, chronic, right upper quadrant    Abnormal Pap smear of cervix    Anemia    iron deficiency   Anxiety    BPD (bronchopulmonary dysplasia)    Depression    Family history of neoplasm of breast 05/12/2022   Headache(784.0)    Hypertension    Papanicolaou smear of cervix with positive high risk human papilloma virus (HPV) test 05/31/2019   05/2019 repeat HPV in 1 year    PONV (postoperative nausea and vomiting)    Hx: of nausea only at age 34   Past Surgical History:  Procedure Laterality Date   CHOLECYSTECTOMY N/A 02/02/2017   Procedure: LAPAROSCOPIC CHOLECYSTECTOMY;  Surgeon: Aviva Signs, MD;  Location: [facility];  Service: General;  Laterality: N/A;   COLONOSCOPY N/A 01/07/2016   Dr. Oneida Alar: Nonbleeding internal hemorrhoids, large. 3 bands successfully placed. Distal ileum containing multiple ulcers which she felt was related to NSAIDs. Biopsies nonspecific.   HEMORRHOID BANDING N/A 01/07/2016   Procedure: HEMORRHOID BANDING;  Surgeon: Danie Binder, MD;  Location: [facility];  Service: Endoscopy;  Laterality: N/A;   MASS EXCISION N/A 11/26/2016   Procedure: EXCISION OF ABDOMINAL CICATRIX 4CM;  Surgeon: Aviva Signs, MD;  Location: [facility];  Service: General;  Laterality: N/A;  p knows to arrive at 7:50   NASAL SEPTOPLASTY W/ TURBINOPLASTY Bilateral 06/24/2013   Procedure:  TURBINATE REDUCTION;  Surgeon: Jodi Marble, MD;  Location: [facility];  Service: ENT;  Laterality: Bilateral;   TONSILLECTOMY AND ADENOIDECTOMY Bilateral 06/24/2013   Procedure: TONSILLECTOMY ;  Surgeon: Jodi Marble, MD;  Location: [facility];  Service: ENT;  Laterality: Bilateral;   TUMOR EXCISION     Hx: of right side of neck   WISDOM TOOTH EXTRACTION     Patient Active Problem List   Diagnosis Date Noted   Breast pain, left 05/12/2022   Irregular periods 05/12/2022   Anxiety and depression 05/12/2022   Pregnancy examination or test, negative result 05/12/2022   Encounter for gynecological examination with Papanicolaou smear of cervix 05/12/2022   Mass of upper inner quadrant of left breast 05/12/2022   Hidradenitis suppurativa 05/12/2022   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   Family history of neoplasm of breast 05/12/2022   Cellulitis of right thigh 09/06/2021   Elevated LFTs 09/06/2021   Essential hypertension 11/07/2020   Papanicolaou smear of cervix with positive high risk human papilloma virus (HPV) test 05/31/2019   Menorrhagia with irregular cycle 05/24/2019   Acute cholecystitis 02/01/2017   Calculus of gallbladder with acute cholecystitis without obstruction    Biliary colic    Abdominal pain 01/29/2017   Gallstones    Inflammation of the retroperitoneum    Cicatrix  Rectal bleeding 12/20/2015   Hemorrhoids 12/20/2015   Hypertrophy of nasal turbinates 06/24/2013   Tonsillar hypertrophy 06/24/2013   Obstructive sleep apnea (adult) (pediatric) 06/24/2013   Morbid obesity ([place]) 06/24/2013    PCP: Leslie Andrea MD  REFERRING PROVIDER: Sanjuana Kava, MD  REFERRING DIAG: [phone] (ICD-10-CM) - Chronic bilateral low back pain with right-sided sciatica  Rationale for Evaluation and Treatment: Rehabilitation  THERAPY DIAG:  Other low back pain  ONSET DATE: 2020  SUBJECTIVE:                                                                                                                                                                                            SUBJECTIVE STATEMENT: Patient states she ran out of Alieve so was not able to take it last few days so back has been bothering her.   PERTINENT HISTORY:  NA  PAIN:  Are you having pain? Yes: NPRS scale: 6/10 Pain location: low back, RT knee  Pain description: pressure, strain, sharp; RT knee 5/10 throbbing, aching Aggravating factors: sitting, walking, standing, stairs, prolonged positions Relieving factors: "nothing really"   PRECAUTIONS: None  WEIGHT BEARING RESTRICTIONS: No  FALLS:  Has patient fallen in last 6 months? No  LIVING ENVIRONMENT: Lives with: lives alone Lives in: House/apartment Stairs: No Has following equipment at home: None  OCCUPATION: Live in care giver   PLOF: Independent  PATIENT GOALS: "Be able to use all of my limbs as God intended"  NEXT MD VISIT:   OBJECTIVE:   DIAGNOSTIC FINDINGS:  Back and knee xrays negative   PATIENT SURVEYS:  FOTO 47% function   COGNITION: Overall cognitive status: Within functional limits for tasks assessed     SENSATION: Decreased sensation to light touch about LT lower arm   POSTURE: rounded shoulders  PALPATION:  Mod TTP about RT lower lumbar paraspinal   LUMBAR ROM:   AROM eval  Flexion Full P!  Extension 25 % limited P!  Right lateral flexion Full  Left lateral flexion Full  Right rotation Full P!  Left rotation Full P!   (Blank rows = not tested)  LOWER EXTREMITY ROM:     Active  Right eval Left eval  Hip flexion    Hip extension    Hip abduction    Hip adduction    Hip internal rotation    Hip external rotation    Knee flexion 125 125  Knee extension 5 2  Ankle dorsiflexion    Ankle plantarflexion    Ankle inversion    Ankle eversion     (Blank rows = not tested)  LOWER EXTREMITY MMT:  MMT Right eval Left eval  Hip flexion 4+ 4+  Hip  extension 3- 4  Hip abduction 5 4+  Hip adduction    Hip internal rotation    Hip external rotation    Knee flexion    Knee extension 5 5  Ankle dorsiflexion 5 5  Ankle plantarflexion    Ankle inversion    Ankle eversion     (Blank rows = not tested)  LUMBAR SPECIAL TESTS:  Straight leg raise test: Positive (LLE)   TODAY'S TREATMENT:                                                                                                                              DATE:  08/13/22 Ab set 10 x 5 second holds March with Ab set 3 x 10  SLR with ab set 2 x 10 bilateral Bridge 3 x 10 Sidelying hip abduction 2 x 10 Bilateral  Prone hip extension 2 x 10 bilateral  07/30/22 Eval    PATIENT EDUCATION:  Education details: 08/13/22: HEP;  Eval: on eval finding, and POC Person educated: Patient Education method: Explanation Education comprehension: verbalized understanding  HOME EXERCISE PROGRAM: Access Code: [email or web address] URL: [email or web address]  Date: 08/13/2022 - Abdominal Bracing  - 1 x daily - 7 x weekly - 3 sets - 10 reps - 5 second hold - Supine March  - 1 x daily - 7 x weekly - 3 sets - 10 reps - Supine Active Straight Leg Raise  - 1 x daily - 7 x weekly - 3 sets - 10 reps - Supine Bridge  - 1 x daily - 7 x weekly - 3 sets - 10 reps - Sidelying Hip Abduction  - 1 x daily - 7 x weekly - 2 sets - 10 reps - Prone Hip Extension  - 1 x daily - 7 x weekly - 2 sets - 10 reps  ASSESSMENT:  CLINICAL IMPRESSION: Patient with good ab set following initial cueing. She is able to complete core and hip strengthening exercises with good mechanics. Cueing given for limiting hyperextension at knees with good carry over and improved awareness of not locking out joints to complete exercises. Performs well and is highly motivated.  Patient will continue to benefit from physical therapy in order to improve function and reduce impairment.   OBJECTIVE IMPAIRMENTS: Abnormal gait, decreased  activity tolerance, decreased mobility, difficulty walking, decreased ROM, decreased strength, increased fascial restrictions, improper body mechanics, postural dysfunction, and pain.   ACTIVITY LIMITATIONS: carrying, lifting, bending, sitting, standing, squatting, stairs, transfers, and locomotion level  PARTICIPATION LIMITATIONS: meal prep, cleaning, laundry, shopping, community activity, occupation, yard work, and school  PERSONAL FACTORS:  NA  are also affecting patient's functional outcome.   REHAB POTENTIAL: Good  CLINICAL DECISION MAKING: Stable/uncomplicated  EVALUATION COMPLEXITY: Low   GOALS: SHORT TERM GOALS: Target date: 08/20/2022  Patient will be independent with initial HEP and self-management strategies to improve functional outcomes Baseline:  Goal status: INITIAL   LONG TERM GOALS: Target date: 09/10/2022  Patient will be independent with advanced HEP and self-management strategies to improve functional outcomes Baseline:  Goal status: INITIAL  2.  Patient will improve FOTO score to predicted value to indicate improvement in functional outcomes Baseline: 47% Goal status: INITIAL  3.  Patient will report reduction of back pain to <3/10 for improved quality of life and ability to perform ADLs  Baseline: 5/10 Goal status: INITIAL  4. Patient will have equal to or > 4+/5 MMT throughout BLE to improve ability to perform functional mobility, stair ambulation and ADLs.  Baseline: See MMT Goal status: INITIAL PLAN:  PT FREQUENCY: 1-2x/week  PT DURATION: 6 weeks  PLANNED INTERVENTIONS: Therapeutic exercises, Therapeutic activity, Neuromuscular re-education, Balance training, Gait training, Patient/Family education, Joint manipulation, Joint mobilization, Stair training, Aquatic Therapy, Dry Needling, Electrical stimulation, Spinal manipulation, Spinal mobilization, Cryotherapy, Moist heat, scar mobilization, Taping, Traction, Ultrasound, Biofeedback, Ionotophoresis  4mg /ml Dexamethasone, and Manual therapy.  PLAN FOR NEXT SESSION: Progress glute, quad and core strengthening as tolerated. Weekly HEP updates.  3:21 PM, 08/13/22 08/15/22 PT, DPT Physical Therapist at [facility]

## 2022-08-14 ENCOUNTER — Ambulatory Visit: Payer: 59 | Admitting: Orthopaedic Surgery

## 2022-08-18 ENCOUNTER — Ambulatory Visit (INDEPENDENT_AMBULATORY_CARE_PROVIDER_SITE_OTHER): Payer: 59 | Admitting: Clinical

## 2022-08-18 DIAGNOSIS — R69 Illness, unspecified: Secondary | ICD-10-CM | POA: Diagnosis not present

## 2022-08-18 DIAGNOSIS — F39 Unspecified mood [affective] disorder: Secondary | ICD-10-CM

## 2022-08-19 ENCOUNTER — Encounter (HOSPITAL_COMMUNITY): Payer: 59 | Admitting: Physical Therapy

## 2022-08-19 ENCOUNTER — Ambulatory Visit: Payer: 59 | Admitting: Orthopaedic Surgery

## 2022-08-25 NOTE — BH Specialist Note (Signed)
Integrated Behavioral Health via Telemedicine Visit  09/05/2022 ORMA ULLMAN FZ:[id number]  Number of Integrated Behavioral Health Clinician visits: 6-Sixth Visit  Session Start time: [id number]   Session End time: 0958  Total time in minutes: 37   Referring Provider: Derrek Monaco, NP Patient/Family location: [facility] Provider location: [facility] [place] [facility]  All persons participating in visit: [name] and [place]   Types of Service: Individual psychotherapy and Video visit  I connected with [place] and/or Norvella S Boquet's  n/a  via  Telephone or Video Enabled Telemedicine Application  (Video is Caregility application) and verified that I am speaking with the correct person using two identifiers. Discussed confidentiality: Yes   I discussed the limitations of telemedicine and the availability of in person appointments.  Discussed there is a possibility of technology failure and discussed alternative modes of communication if that failure occurs.  I discussed that engaging in this telemedicine visit, they consent to the provision of behavioral healthcare and the services will be billed under their insurance.  Patient and/or legal guardian expressed understanding and consented to Telemedicine visit: Yes   Presenting Concerns: Patient and/or family reports the following symptoms/concerns: Increased feelings of depression and anxiety with passive SI, no intent and no plan after relationship losses and conflicting feelings regarding staying or leaving current work position.  Duration of problem: Increasing in recent weeks; Severity of problem: severe  Patient and/or Family's Strengths/Protective Factors: Social connections, Concrete supports in place (healthy food, safe environments, etc.), and Sense of purpose  Goals Addressed: Patient will:  Reduce symptoms of: anxiety, depression, and stress   Increase  knowledge and/or ability of: stress reduction   Demonstrate ability to: Increase healthy adjustment to current life circumstances and Increase motivation to adhere to plan of care  Progress towards Goals: Ongoing  Interventions: Interventions utilized:  Solution-Focused Strategies and Link to [employer] Standardized Assessments completed: GAD-7 and PHQ 9  Patient and/or Family Response: Patient agrees with treatment plan.   Assessment: Patient currently experiencing Borderline personality disorder; Mood disorder, unspecified..   Patient may benefit from continued therapeutic interventions.  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 They were provided an opportunity to ask questions and all were answered. They agreed with the plan and demonstrated an understanding of the instructions.   They were advised to call back or seek an in-person evaluation if the symptoms worsen or if the condition fails to improve as anticipated.  Garlan Fair, LCSW      09/05/2022    9:38 AM 05/12/2022    2:46 PM 11/07/2020    1:32 PM 05/24/2019    3:52 PM 05/24/2019    3:51 PM  Depression screen PHQ 2/9  Decreased Interest 2 3 0 2 2  Down, Depressed, Hopeless '3 3 1 2 2  '$ PHQ - 2 Score '5 6 1 4 4  '$ Altered sleeping '3 3 3 3   '$ Tired, decreased energy '3 2 3 2   '$ Change in appetite 2  $'2 3 3   'x$ Feeling bad or failure about yourself  '3 3 1 2   '$ Trouble concentrating '3 1 2 1    '$ Moving slowly or fidgety/restless 0 '1 1 2   '$ Suicidal thoughts 1 0 1 0   PHQ-9 Score '20 18 15 17       '$ 09/05/2022    9:48 AM 05/12/2022    2:46 PM 11/07/2020    1:32 PM  GAD 7 : Generalized Anxiety Score  Nervous, Anxious, on Edge '3 3 3  '$ Control/stop worrying '3 3 3  '$ Worry too much - different things '3 3 3  '$ Trouble relaxing '3 3 3  '$ Restless 0 1 1  Easily annoyed or irritable '3 1 2  '$ Afraid - awful might happen '3 1 1  '$ Total GAD 7 Score 18 15 16

## 2022-08-27 ENCOUNTER — Ambulatory Visit (INDEPENDENT_AMBULATORY_CARE_PROVIDER_SITE_OTHER): Payer: 59 | Admitting: Orthopaedic Surgery

## 2022-08-27 ENCOUNTER — Encounter (HOSPITAL_COMMUNITY): Payer: 59 | Admitting: Physical Therapy

## 2022-08-27 ENCOUNTER — Other Ambulatory Visit: Payer: Self-pay | Admitting: Orthopaedic Surgery

## 2022-08-27 ENCOUNTER — Ambulatory Visit (INDEPENDENT_AMBULATORY_CARE_PROVIDER_SITE_OTHER): Payer: 59

## 2022-08-27 ENCOUNTER — Encounter: Payer: Self-pay | Admitting: Orthopaedic Surgery

## 2022-08-27 VITALS — BP 153/96 | HR 62

## 2022-08-27 DIAGNOSIS — G8929 Other chronic pain: Secondary | ICD-10-CM

## 2022-08-27 DIAGNOSIS — M25561 Pain in right knee: Secondary | ICD-10-CM

## 2022-08-27 DIAGNOSIS — M5441 Lumbago with sciatica, right side: Secondary | ICD-10-CM

## 2022-08-27 DIAGNOSIS — Z6841 Body Mass Index (BMI) 40.0 and over, adult: Secondary | ICD-10-CM

## 2022-08-27 MED ORDER — HYDROCODONE-ACETAMINOPHEN 5-325 MG PO TABS: ORAL_TABLET | ORAL | 0 refills | Status: DC

## 2022-08-27 NOTE — Patient Instructions (Signed)
Joint Steroid Injection A joint steroid injection is a procedure to relieve swelling and pain in a joint. Steroids are medicines that reduce inflammation. In this procedure, your health care provider uses a syringe and a needle to inject a steroid medicine into a painful and inflamed joint. A pain-relieving medicine (anesthetic) may be injected along with the steroid. In some cases, your health care provider may use an imaging technique such as ultrasound or fluoroscopy to guide the injection. Joints that are often treated with steroid injections include the knee, shoulder, hip, and spine. These injections may also be used in the elbow, ankle, and joints of the hands or feet. You may have joint steroid injections as part of your treatment for inflammation caused by: Gout. Rheumatoid arthritis. Advanced wear-and-tear arthritis (osteoarthritis). Tendinitis. Bursitis. Joint steroid injections may be repeated, but having them too often can damage a joint or the skin over the joint. You should not have joint steroid injections less than 6 weeks apart or more than four times a year. Tell a health care provider about: Any allergies you have. All medicines you are taking, including vitamins, herbs, eye drops, creams, and over-the-counter medicines. Any problems you or family members have had with anesthetic medicines. Any blood disorders you have. Any surgeries you have had. Any medical conditions you have. Whether you are pregnant or may be pregnant. What are the risks? Generally, this is a safe treatment. However, problems may occur, including: Infection. Bleeding. Allergic reactions to medicines. Damage to the joint or tissues around the joint. Thinning of skin or loss of skin color over the joint. Temporary flushing of the face or chest. Temporary increase in pain. Temporary increase in blood sugar. Failure to relieve inflammation or pain. What happens before the treatment? Medicines Ask  your health care provider about: Changing or stopping your regular medicines. This is especially important if you are taking diabetes medicines or blood thinners. Taking medicines such as aspirin and ibuprofen. These medicines can thin your blood. Do not take these medicines unless your health care provider tells you to take them. Taking over-the-counter medicines, vitamins, herbs, and supplements. General instructions You may have imaging tests of your joint. Ask your health care provider if you can drive yourself home after the procedure. What happens during the treatment?  Your health care provider will position you for the injection and locate the injection site over your joint. The skin over the joint will be cleaned with a germ-killing soap. Your health care provider may: Spray a numbing solution (topical anesthetic) over the injection site. Inject a local anesthetic under the skin above your joint. The needle will be placed through your skin into your joint. Your health care provider may use imaging to guide the needle to the right spot for the injection. If imaging is used, a special contrast dye may be injected to confirm that the needle is in the correct location. The steroid medicine will be injected into your joint. Anesthetic may be injected along with the steroid. This may be a medicine that relieves pain for a short time (short-acting anesthetic) or for a longer time (long-acting anesthetic). The needle will be removed, and an adhesive bandage (dressing) will be placed over the injection site. The procedure may vary among health care providers and hospitals. What can I expect after the treatment? You will be able to go home after the treatment. It is normal to feel slight flushing for a few days after the injection. After the treatment, it is  common to have an increase in joint pain after the anesthetic has worn off. This may happen about an hour after a short-acting anesthetic  or about 8 hours after a longer-acting anesthetic. You should begin to feel relief from joint pain and swelling after 24 to 48 hours. Contact your health care provider if you do not begin to feel relief after 2 days. Follow these instructions at home: Injection site care Leave the adhesive dressing over your injection site in place until your health care provider says you can remove it. Check your injection site every day for signs of infection. Check for: More redness, swelling, or pain. Fluid or blood. Warmth. Pus or a bad smell. Activity Return to your normal activities as told by your health care provider. Ask your health care provider what activities are safe for you. You may be asked to limit activities that put stress on the joint for a few days. Do joint exercises as told by your health care provider. Do not take baths, swim, or use a hot tub until your health care provider approves. Ask your health care provider if you may take showers. You may only be allowed to take sponge baths. Managing pain, stiffness, and swelling  If directed, put ice on the joint. To do this: Put ice in a plastic bag. Place a towel between your skin and the bag. Leave the ice on for 20 minutes, 2-3 times a day. Remove the ice if your skin turns bright red. This is very important. If you cannot feel pain, heat, or cold, you have a greater risk of damage to the area. Raise (elevate) your joint above the level of your heart when you are sitting or lying down. General instructions Take over-the-counter and prescription medicines only as told by your health care provider. Do not use any products that contain nicotine or tobacco, such as cigarettes, e-cigarettes, and chewing tobacco. These can delay joint healing. If you need help quitting, ask your health care provider. If you have diabetes, be aware that your blood sugar may be slightly elevated for several days after the injection. Keep all follow-up visits.  This is important. Contact a health care provider if you have: Chills or a fever. Any signs of infection at your injection site. Increased pain or swelling or no relief after 2 days. Summary A joint steroid injection is a treatment to relieve pain and swelling in a joint. Steroids are medicines that reduce inflammation. Your health care provider may add an anesthetic along with the steroid. You may have joint steroid injections as part of your arthritis treatment. Joint steroid injections may be repeated, but having them too often can damage a joint or the skin over the joint. Contact your health care provider if you have a fever, chills, or signs of infection, or if you get no relief from joint pain or swelling. This information is not intended to replace advice given to you by your health care provider. Make sure you discuss any questions you have with your health care provider. Document Revised: 12/09/2019 Document Reviewed: 12/09/2019 Elsevier Patient Education  [place].

## 2022-08-27 NOTE — Progress Notes (Signed)
My back is still tender.  She has been to PT for her back and I have reviewed the notes.  She is to go later today.  She still has pain but is better.  She needs to continue this.  Her right knee is painful with swelling and popping and feeling it may give way.  She has no trauma.  Lower back is tender, decreased ROM, NV intact, SLR negative.  Right knee has effusion, crepitus, more patella and lateral pain, stable, NV intact.  X-rays were done of the right knee, reported separately.  Encounter Diagnoses  Name Primary?   Chronic pain of right knee Yes   Chronic bilateral low back pain with right-sided sciatica    Body mass index 50.0-59.9, adult ([place])    Morbid obesity ([place])    I have reviewed the [place] web site prior to prescribing narcotic medicine for this patient.  PROCEDURE NOTE:  The patient requests injections of the right knee , verbal consent was obtained.  The right knee was prepped appropriately after time out was performed.   Sterile technique was observed and injection of 1 cc of DepoMedrol 42m with several cc's of plain xylocaine. Anesthesia was provided by ethyl chloride and a 20-gauge needle was used to inject the knee area. The injection was tolerated well.  A band aid dressing was applied.  The patient was advised to apply ice later today and tomorrow to the injection sight as needed.  Continue her PT for the back.  She may need MRI of back and maybe knee.  Return in one month.  Call if any problem.  Precautions discussed.  Electronically Signed WSanjuana Kava MD 2/14/202410:29 AM

## 2022-08-28 ENCOUNTER — Telehealth: Payer: Self-pay | Admitting: Orthopaedic Surgery

## 2022-08-28 NOTE — Telephone Encounter (Signed)
Patient called at 11:46 and lvm stating she sent to her pharmacy and she states Dr. Luna Glasgow said he could not prescribe her any pain medicine he would do a steroid cream.  She went to pick it up and they gave her pain medicine.   Please call the patient back at [phone]   Thank You

## 2022-09-01 ENCOUNTER — Ambulatory Visit (HOSPITAL_COMMUNITY): Payer: 59 | Attending: Orthopaedic Surgery | Admitting: Physical Therapy

## 2022-09-01 DIAGNOSIS — M5459 Other low back pain: Secondary | ICD-10-CM | POA: Diagnosis not present

## 2022-09-01 NOTE — Therapy (Signed)
OUTPATIENT PHYSICAL THERAPY TREATMENT   Patient Name: Jacqueline Alvarado MRN: [id number] DOB:08-27-1995, 27 y.o., female Today's Date: 09/01/2022  END OF SESSION:  PT End of Session - 09/01/22 1356     Visit Number 3    Number of Visits 12    Date for PT Re-Evaluation 09/10/22    Authorization Type [employer]    Progress Note Due on Visit 10    PT Start Time 1353    PT Stop Time 1431    PT Time Calculation (min) 38 min    Activity Tolerance Patient tolerated treatment well    Behavior During Therapy WFL for tasks assessed/performed             Past Medical History:  Diagnosis Date   Abdominal pain, chronic, right upper quadrant    Abnormal Pap smear of cervix    Anemia    iron deficiency   Anxiety    BPD (bronchopulmonary dysplasia)    Depression    Family history of neoplasm of breast 05/12/2022   Headache(784.0)    Hypertension    Papanicolaou smear of cervix with positive high risk human papilloma virus (HPV) test 05/31/2019   05/2019 repeat HPV in 1 year    PONV (postoperative nausea and vomiting)    Hx: of nausea only at age 56   Past Surgical History:  Procedure Laterality Date   CHOLECYSTECTOMY N/A 02/02/2017   Procedure: LAPAROSCOPIC CHOLECYSTECTOMY;  Surgeon: Aviva Signs, MD;  Location: [facility];  Service: General;  Laterality: N/A;   COLONOSCOPY N/A 01/07/2016   Dr. Oneida Alar: Nonbleeding internal hemorrhoids, large. 3 bands successfully placed. Distal ileum containing multiple ulcers which she felt was related to NSAIDs. Biopsies nonspecific.   HEMORRHOID BANDING N/A 01/07/2016   Procedure: HEMORRHOID BANDING;  Surgeon: Danie Binder, MD;  Location: [facility];  Service: Endoscopy;  Laterality: N/A;   MASS EXCISION N/A 11/26/2016   Procedure: EXCISION OF ABDOMINAL CICATRIX 4CM;  Surgeon: Aviva Signs, MD;  Location: [facility];  Service: General;  Laterality: N/A;  p knows to arrive at 7:50   NASAL SEPTOPLASTY W/ TURBINOPLASTY Bilateral 06/24/2013   Procedure:  TURBINATE REDUCTION;  Surgeon: Jodi Marble, MD;  Location: [place];  Service: ENT;  Laterality: Bilateral;   TONSILLECTOMY AND ADENOIDECTOMY Bilateral 06/24/2013   Procedure: TONSILLECTOMY ;  Surgeon: Jodi Marble, MD;  Location: [facility];  Service: ENT;  Laterality: Bilateral;   TUMOR EXCISION     Hx: of right side of neck   WISDOM TOOTH EXTRACTION     Patient Active Problem List   Diagnosis Date Noted   Breast pain, left 05/12/2022   Irregular periods 05/12/2022   Anxiety and depression 05/12/2022   Pregnancy examination or test, negative result 05/12/2022   Encounter for gynecological examination with Papanicolaou smear of cervix 05/12/2022   Mass of upper inner quadrant of left breast 05/12/2022   Hidradenitis suppurativa 05/12/2022   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   Family history of neoplasm of breast 05/12/2022   Cellulitis of right thigh 09/06/2021   Elevated LFTs 09/06/2021   Essential hypertension 11/07/2020   Papanicolaou smear of cervix with positive high risk human papilloma virus (HPV) test 05/31/2019   Menorrhagia with irregular cycle 05/24/2019   Acute cholecystitis 02/01/2017   Calculus of gallbladder with acute cholecystitis without obstruction    Biliary colic    Abdominal pain 01/29/2017   Gallstones    Inflammation of the retroperitoneum    Cicatrix  Rectal bleeding 12/20/2015   Hemorrhoids 12/20/2015   Hypertrophy of nasal turbinates 06/24/2013   Tonsillar hypertrophy 06/24/2013   Obstructive sleep apnea (adult) (pediatric) 06/24/2013   Morbid obesity ([place]) 06/24/2013    PCP: Leslie Andrea MD  REFERRING PROVIDER: Sanjuana Kava, MD  REFERRING DIAG: [id number] (ICD-10-CM) - Chronic bilateral low back pain with right-sided sciatica  Rationale for Evaluation and Treatment: Rehabilitation  THERAPY DIAG:  Other low back pain  ONSET DATE: 2020  SUBJECTIVE:                                                                                                                                                                                            SUBJECTIVE STATEMENT: Doing well overall. Back is still hurting. Her knee is feeling a little better today.   PERTINENT HISTORY:  NA  PAIN:  Are you having pain? Yes: NPRS scale: 2/10 (RT knee); (low back)6/10 Pain location: low back, RT knee  Pain description: pressure, strain, sharp; RT knee throbbing, aching Aggravating factors: sitting, walking, standing, stairs, prolonged positions Relieving factors: "nothing really"   PRECAUTIONS: None  WEIGHT BEARING RESTRICTIONS: No  FALLS:  Has patient fallen in last 6 months? No  LIVING ENVIRONMENT: Lives with: lives alone Lives in: House/apartment Stairs: No Has following equipment at home: None  OCCUPATION: Live in care giver   PLOF: Independent  PATIENT GOALS: "Be able to use all of my limbs as God intended"  NEXT MD VISIT:   OBJECTIVE:   DIAGNOSTIC FINDINGS:  Back and knee xrays negative   PATIENT SURVEYS:  FOTO 47% function   COGNITION: Overall cognitive status: Within functional limits for tasks assessed     SENSATION: Decreased sensation to light touch about LT lower arm   POSTURE: rounded shoulders  PALPATION:  Mod TTP about RT lower lumbar paraspinal   LUMBAR ROM:   AROM eval  Flexion Full P!  Extension 25 % limited P!  Right lateral flexion Full  Left lateral flexion Full  Right rotation Full P!  Left rotation Full P!   (Blank rows = not tested)  LOWER EXTREMITY ROM:     Active  Right eval Left eval  Hip flexion    Hip extension    Hip abduction    Hip adduction    Hip internal rotation    Hip external rotation    Knee flexion 125 125  Knee extension 5 2  Ankle dorsiflexion    Ankle plantarflexion    Ankle inversion    Ankle eversion     (Blank rows = not tested)  LOWER EXTREMITY MMT:    MMT Right eval  Left eval  Hip flexion 4+ 4+  Hip  extension 3- 4  Hip abduction 5 4+  Hip adduction    Hip internal rotation    Hip external rotation    Knee flexion    Knee extension 5 5  Ankle dorsiflexion 5 5  Ankle plantarflexion    Ankle inversion    Ankle eversion     (Blank rows = not tested)  LUMBAR SPECIAL TESTS:  Straight leg raise test: Positive (LLE)   TODAY'S TREATMENT:                                                                                                                              DATE: 09/01/22 Ab set 10 x 5    second holds March with Ab set 3 x 10  SLR with ab set 2 x 10 bilateral Bridge  20 x 5" Sidelying hip abduction 2 x 10 Bilateral    08/13/22 Ab set 10 x 5 second holds March with Ab set 3 x 10  SLR with ab set 2 x 10 bilateral Bridge 3 x 10 Sidelying hip abduction 2 x 10 Bilateral  Prone hip extension 2 x 10 bilateral  07/30/22 Eval    PATIENT EDUCATION:  Education details: 08/13/22: HEP;  Eval: on eval finding, and POC Person educated: Patient Education method: Explanation Education comprehension: verbalized understanding  HOME EXERCISE PROGRAM: Access Code: [email or web address] URL: [email or web address]  09/01/22 - Prone Alternating Arm and Leg Lifts  - 1 x daily - 7 x weekly - 2 sets - 10 reps  Date: 08/13/2022 - Abdominal Bracing  - 1 x daily - 7 x weekly - 3 sets - 10 reps - 5 second hold - Supine March  - 1 x daily - 7 x weekly - 3 sets - 10 reps - Supine Active Straight Leg Raise  - 1 x daily - 7 x weekly - 3 sets - 10 reps - Supine Bridge  - 1 x daily - 7 x weekly - 3 sets - 10 reps - Sidelying Hip Abduction  - 1 x daily - 7 x weekly - 2 sets - 10 reps - Prone Hip Extension  - 1 x daily - 7 x weekly - 2 sets - 10 reps  ASSESSMENT:  CLINICAL IMPRESSION: Patient tolerated session well today. Continued with established POC for hip and core strengthening. Patient showing overall good return of previous there ex. Discussed importance of TA activation during functional  movement like bending forward, as patient noted this typically causes pain. She does note somewhat improved symptoms following cues for TA activation during seated lumbar flexion. Discussed possible contribution factors and educated on there ex progressions to address core stability. Issued updated HEP handout. Patient will continue to benefit from skilled therapy services to reduce remaining deficits and improve functional ability.    OBJECTIVE IMPAIRMENTS: Abnormal gait, decreased activity tolerance, decreased mobility, difficulty walking, decreased ROM, decreased strength, increased fascial restrictions, improper  body mechanics, postural dysfunction, and pain.   ACTIVITY LIMITATIONS: carrying, lifting, bending, sitting, standing, squatting, stairs, transfers, and locomotion level  PARTICIPATION LIMITATIONS: meal prep, cleaning, laundry, shopping, community activity, occupation, yard work, and school  PERSONAL FACTORS:  NA  are also affecting patient's functional outcome.   REHAB POTENTIAL: Good  CLINICAL DECISION MAKING: Stable/uncomplicated  EVALUATION COMPLEXITY: Low   GOALS: SHORT TERM GOALS: Target date: 08/20/2022  Patient will be independent with initial HEP and self-management strategies to improve functional outcomes Baseline:  Goal status: INITIAL   LONG TERM GOALS: Target date: 09/10/2022  Patient will be independent with advanced HEP and self-management strategies to improve functional outcomes Baseline:  Goal status: INITIAL  2.  Patient will improve FOTO score to predicted value to indicate improvement in functional outcomes Baseline: 47% Goal status: INITIAL  3.  Patient will report reduction of back pain to <3/10 for improved quality of life and ability to perform ADLs  Baseline: 5/10 Goal status: INITIAL  4. Patient will have equal to or > 4+/5 MMT throughout BLE to improve ability to perform functional mobility, stair ambulation and ADLs.  Baseline: See  MMT Goal status: INITIAL PLAN:  PT FREQUENCY: 1-2x/week  PT DURATION: 6 weeks  PLANNED INTERVENTIONS: Therapeutic exercises, Therapeutic activity, Neuromuscular re-education, Balance training, Gait training, Patient/Family education, Joint manipulation, Joint mobilization, Stair training, Aquatic Therapy, Dry Needling, Electrical stimulation, Spinal manipulation, Spinal mobilization, Cryotherapy, Moist heat, scar mobilization, Taping, Traction, Ultrasound, Biofeedback, Ionotophoresis 81m/ml Dexamethasone, and Manual therapy. .Marland Kitchen PLAN FOR NEXT SESSION: Progress glute, quad and core strengthening as tolerated. Weekly HEP updates.  3:36 PM, 09/01/22 CJosue HectorPT DPT  Physical [facility] ([phone]

## 2022-09-03 ENCOUNTER — Encounter (HOSPITAL_COMMUNITY): Payer: 59 | Admitting: Physical Therapy

## 2022-09-03 ENCOUNTER — Telehealth (HOSPITAL_COMMUNITY): Payer: Self-pay | Admitting: Physical Therapy

## 2022-09-03 NOTE — Telephone Encounter (Signed)
Called about missed visit today. No answer and VM not set up. Unable to leave message.   4:47 PM, 09/03/22 Josue Hector PT DPT  Physical Therapist with [facility]  [phone]

## 2022-09-03 NOTE — Telephone Encounter (Signed)
Spoke with patient. Advised her she can use Voltaren or any other topical rub that she chooses and discussed with her how to rub into area well. She verbalized understanding.

## 2022-09-05 ENCOUNTER — Ambulatory Visit (INDEPENDENT_AMBULATORY_CARE_PROVIDER_SITE_OTHER): Payer: 59 | Admitting: Clinical

## 2022-09-05 DIAGNOSIS — F603 Borderline personality disorder: Secondary | ICD-10-CM

## 2022-09-05 DIAGNOSIS — F39 Unspecified mood [affective] disorder: Secondary | ICD-10-CM

## 2022-09-05 DIAGNOSIS — R69 Illness, unspecified: Secondary | ICD-10-CM | POA: Diagnosis not present

## 2022-09-05 NOTE — Progress Notes (Deleted)
REFERRING PROVIDER: ***  PRIMARY PROVIDER:  Leslie Andrea, MD  PRIMARY REASON FOR VISIT:  No diagnosis found.  HISTORY OF PRESENT ILLNESS:   Ms. Jacqueline Alvarado, a 27 y.o. female, was seen for a [facility] cancer genetics consultation at the request of *** due to a family history of breast cancer and a known family history of a BRCA1 gene mutation.  Ms. Beougher presents to clinic today to discuss the possibility of a hereditary predisposition to cancer, to discuss genetic testing, and to further clarify her future cancer risks, as well as potential cancer risks for family members.   Ms. Lemanski is a 27 y.o. female with no personal history of cancer.  ***  CANCER HISTORY:  Oncology History   No history exists.     RISK FACTORS:  Mammogram within the last year: *** Number of breast biopsies: {Numbers 1-12 multi-select:20307}. Colonoscopy: {Yes/No-Ex:120004}; {normal/abnormal/not examined:14677}. Hysterectomy: {Yes/No-Ex:120004}.  Ovaries intact: {Yes/No-Ex:120004}.  Up to date with pelvic exams: {Yes/No-Ex:120004}. Menarche was at age ***.  First live birth at age ***.  Menopausal status: {Menopause:31378}.  OCP use for approximately {Numbers 1-12 multi-select:20307} years.  HRT use: {Numbers 1-12 multi-select:20307} years. Dermatology screening: ***  Past Medical History:  Diagnosis Date   Abdominal pain, chronic, right upper quadrant    Abnormal Pap smear of cervix    Anemia    iron deficiency   Anxiety    BPD (bronchopulmonary dysplasia)    Depression    Family history of neoplasm of breast 05/12/2022   Headache(784.0)    Hypertension    Papanicolaou smear of cervix with positive high risk human papilloma virus (HPV) test 05/31/2019   05/2019 repeat HPV in 1 year    PONV (postoperative nausea and vomiting)    Hx: of nausea only at age 40    Past Surgical History:  Procedure Laterality Date   CHOLECYSTECTOMY N/A 02/02/2017   Procedure: LAPAROSCOPIC CHOLECYSTECTOMY;   Surgeon: Aviva Signs, MD;  Location: [facility];  Service: General;  Laterality: N/A;   COLONOSCOPY N/A 01/07/2016   Dr. Oneida Alar: Nonbleeding internal hemorrhoids, large. 3 bands successfully placed. Distal ileum containing multiple ulcers which she felt was related to NSAIDs. Biopsies nonspecific.   HEMORRHOID BANDING N/A 01/07/2016   Procedure: HEMORRHOID BANDING;  Surgeon: Danie Binder, MD;  Location: [facility];  Service: Endoscopy;  Laterality: N/A;   MASS EXCISION N/A 11/26/2016   Procedure: EXCISION OF ABDOMINAL CICATRIX 4CM;  Surgeon: Aviva Signs, MD;  Location: [facility];  Service: General;  Laterality: N/A;  p knows to arrive at 7:50   NASAL SEPTOPLASTY W/ TURBINOPLASTY Bilateral 06/24/2013   Procedure: TURBINATE REDUCTION;  Surgeon: Jodi Marble, MD;  Location: [place];  Service: ENT;  Laterality: Bilateral;   TONSILLECTOMY AND ADENOIDECTOMY Bilateral 06/24/2013   Procedure: TONSILLECTOMY ;  Surgeon: Jodi Marble, MD;  Location: [place];  Service: ENT;  Laterality: Bilateral;   TUMOR EXCISION     Hx: of right side of neck   WISDOM TOOTH EXTRACTION      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  Tobacco Use   Smoking status: Former    Years: 1.00    Types: Cigarettes    Quit date: 10/22/2016  Years since quitting: 5.8   Smokeless tobacco: Never   Tobacco comments:    smoked only 1-2 cig weekly for 1 year  Vaping Use   Vaping Use: Some days  Substance and Sexual Activity   Alcohol use: Yes    Alcohol/week: 0.0 standard drinks of alcohol    Comment: occasional   Drug use: Yes    Frequency: 7.0 times per week    Types: Marijuana    Comment: 3x/week   Sexual activity: Yes    Birth control/protection: Pill  Other Topics Concern    Not on file  Social History Narrative   Not on file   Social Determinants of Health   Financial Resource Strain: High Risk (05/12/2022)   Overall Financial Resource Strain (CARDIA)    Difficulty of Paying Living Expenses: Hard  Food Insecurity: Food Insecurity Present (05/12/2022)   Hunger Vital Sign    Worried About Running Out of Food in the Last Year: Never true    Ran Out of Food in the Last Year: Sometimes true  Transportation Needs: Unmet Transportation Needs (05/12/2022)   PRAPARE - Hydrologist (Medical): Yes    Lack of Transportation (Non-Medical): Yes  Physical Activity: Insufficiently Active (05/12/2022)   Exercise Vital Sign    Days of Exercise per Week: 1 day    Minutes of Exercise per Session: 30 min  Stress: Stress Concern Present (05/12/2022)   [place]    Feeling of Stress : Very much  Social Connections: Moderately Integrated (05/12/2022)   Social Connection and Isolation Panel [NHANES]    Frequency of Communication with Friends and Family: More than three times a week    Frequency of Social Gatherings with Friends and Family: Once a week    Attends Religious Services: 1 to 4 times per year    Active Member of [employer] or Organizations: Yes    Attends Archivist Meetings: 1 to 4 times per year    Marital Status: Never married     FAMILY HISTORY:  We obtained a detailed, 4-generation family history.  Significant diagnoses are listed below: Family History  Problem Relation Age of Onset   Arthritis Mother    Breast cancer Mother    Hypertension Father    COPD Father    Melanoma Father        X2   Cancer - Cervical Other    Diabetes Other    Stroke Other     Ms. Beller is {aware/unaware} of previous family history of genetic testing for hereditary cancer risks. Patient's maternal ancestors are of *** descent, and paternal ancestors are of *** descent.  There {IS NO:12509} reported Ashkenazi Jewish ancestry. There {IS NO:12509} known consanguinity.  GENETIC COUNSELING ASSESSMENT: Ms. Paladino is a 27 y.o. female with a {Personal/family:20331} history of {cancer/polyps} which is somewhat suggestive of a {DISEASE} and predisposition to cancer given ***. We, therefore, discussed and recommended the following at today's visit.   DISCUSSION: We discussed that *** - ***% of *** is hereditary, with most cases of hereditary *** cancer associated with ***.  There are other genes that can be associated with hereditary *** cancer syndromes.  These include ***.  We discussed that testing is beneficial for several reasons, including knowing about other cancer risks, identifying potential screening and risk-reduction options that may be appropriate, and to understanding if other family members could be at risk for cancer and allowing them to undergo  genetic testing.  We reviewed the characteristics, features and inheritance patterns of hereditary cancer syndromes. We also discussed genetic testing, including the appropriate family members to test, the process of testing, insurance coverage and turn-around-time for results. We discussed the implications of a negative, positive, and variant of uncertain significant result. ***We discussed that negative results would be uninformative given that Ms. Woodrome does not have a personal history of cancer. We recommended Ms. Fawley pursue genetic testing for a panel that contains genes associated with ***.  Ms. Felicia was offered a common hereditary cancer panel (48 genes) and an expanded pan-cancer panel (85 genes). Ms. Varner was informed of the benefits and limitations of each panel, including that expanded pan-cancer panels contain several genes that do not have clear management guidelines at this point in time.  We also discussed that as the number of genes included on a panel increases, the chances of variants of uncertain  significance increases.  After considering the benefits and limitations of each gene panel, Ms. Beier elected to have an *** through ***.   Based on Ms. Yamashiro's {Personal/family:20331} history of cancer, she meets medical criteria for genetic testing. Despite that she meets criteria, she may still have an out of pocket cost. We discussed that if her out of pocket cost for testing is over $100, the laboratory should contact them to discuss self-pay options and/or patient pay assistance programs.   ***We reviewed the characteristics, features and inheritance patterns of hereditary cancer syndromes. We also discussed genetic testing, including the appropriate family members to test, the process of testing, insurance coverage and turn-around-time for results. We discussed the implications of a negative, positive and/or variant of uncertain significant result. In order to get genetic test results in a timely manner so that Ms. Rosebrook can use these genetic test results for surgical decisions, we recommended Ms. Thieme pursue genetic testing for the ***. Once complete, we recommend Ms. Wiechmann pursue reflex genetic testing to the *** gene panel.   Based on Ms. Larmer's {Personal/family:20331} history of cancer, she meets medical criteria for genetic testing. Despite that she meets criteria, she may still have an out of pocket cost.   ***We discussed with Ms. Autry that the {Personal/family:20331} history does not meet insurance or NCCN criteria for genetic testing and, therefore, is not highly consistent with a familial hereditary cancer syndrome.  We feel she is at low risk to harbor a gene mutation associated with such a condition. Thus, we did not recommend any genetic testing, at this time, and recommended Ms. Mcmurtrey continue to follow the cancer screening guidelines given by her primary healthcare provider.  ***In order to estimate her chance of having a {CA GENE:62345} mutation, we used statistical models  ({GENMODELS:62370}) that consider her personal medical history, family history and ancestry.  Because each model is different, there can be a lot of variability in the risks they give.  Therefore, these numbers must be considered a rough range and not a precise risk of having a {CA GENE:62345} mutation.  These models estimate that she has approximately a ***-***% chance of having a mutation. Based on this assessment of her family and personal history, genetic testing {IS/ISNOT:34056} recommended.  ***Based on the patient's {Personal/family:20331} history, a statistical model ({GENMODELS:62370}) was used to estimate her risk of developing {CA HX:54794}. This estimates her lifetime risk of developing {CA HX:54794} to be approximately ***%. This estimation does not consider any genetic testing results.  The patient's lifetime breast cancer risk is a preliminary  estimate based on available information using one of several models endorsed by the [place] (ACS). The ACS recommends consideration of breast MRI screening as an adjunct to mammography for patients at high risk (defined as 20% or greater lifetime risk).   ***Ms. Petion has been determined to be at high risk for breast cancer.  Therefore, we recommend that annual screening with mammography and breast MRI be performed.  ***begin at age 44, or 10 years prior to the age of breast cancer diagnosis in a relative (whichever is earlier).  We discussed that Ms. Hileman should discuss her individual situation with her referring physician and determine a breast cancer screening plan with which they are both comfortable.    We discussed the Genetic Information Non-Discrimination Act (GINA) of 2008, which helps protect individuals against genetic discrimination based on their genetic test results.  It impacts both health insurance and employment.  With health insurance, it protects against genetic test results being used for increased premiums or policy  termination. For employment, it protects against hiring, firing and promoting decisions based on genetic test results.  GINA does not apply to those in the [employer], those who work for companies with less than 15 employees, and new life insurance or long-term disability insurance policies.  Health status due to a cancer diagnosis is not protected under GINA.  PLAN: After considering the risks, benefits, and limitations, Ms. Petter provided informed consent to pursue genetic testing and the blood sample was sent to {Lab} Laboratories for analysis of the {test}. Results should be available within approximately {TAT TIME} weeks' time, at which point they will be disclosed by telephone to Ms. Forstner, as will any additional recommendations warranted by these results. Ms. Siravo will receive a summary of her genetic counseling visit and a copy of her results once available. This information will also be available in Epic.   *** Despite our recommendation, Ms. Zaragosa did not wish to pursue genetic testing at today's visit. We understand this decision and remain available to coordinate genetic testing at any time in the future. We, therefore, recommend Ms. Beaudoin continue to follow the cancer screening guidelines given by her primary healthcare provider.  ***Based on Ms. Glascoe's family history, we recommended her ***, who was diagnosed with *** at age ***, have genetic counseling and testing. Ms. Pele will let us know if we can be of any assistance in coordinating genetic counseling and/or testing for this family member.   Lastly, we encouraged Ms. Kupper to remain in contact with cancer genetics annually so that we can continuously update the family history and inform her of any changes in cancer genetics and testing that may be of benefit for this family.   Ms. Marie questions were answered to her satisfaction today. Our contact information was provided should additional questions or concerns arise.  ***Thank you for the referral and allowing [place] to share in the care of your patient.   Pinki Rottman M. Joette Catching, [place], [facility] Genetic Counselor [email or web address] (P) [phone]   The patient was seen for a total of *** minutes in face-to-face genetic counseling.  ***The was patient was accompanied by ***.  ***The patient was seen alone.  Drs. Lindi Adie and/or Burr Medico were available to discuss this case as needed.  _______________________________________________________________________ For Office Staff:  Number of people involved in session: *** Was an Intern/ student involved with case: {YES/NO:63}

## 2022-09-05 NOTE — Patient Instructions (Signed)
[facility] [place], [place] [address] [phone] (main office) [phone] ([place] office)  [email or web address]

## 2022-09-08 ENCOUNTER — Inpatient Hospital Stay: Payer: 59

## 2022-09-08 ENCOUNTER — Inpatient Hospital Stay: Payer: 59 | Admitting: Genetic Counselor

## 2022-09-10 ENCOUNTER — Ambulatory Visit: Payer: 59 | Admitting: Adult Health

## 2022-09-10 NOTE — BH Specialist Note (Signed)
Pt requests to reschedule; rescheduled for CCA(Comprehensive Clinical Assessment) on October 09, 2022 at 9:45am for virtual visit.

## 2022-09-11 ENCOUNTER — Encounter: Payer: Self-pay | Admitting: Radiology

## 2022-09-11 ENCOUNTER — Encounter (HOSPITAL_COMMUNITY): Payer: 59 | Admitting: Physical Therapy

## 2022-09-17 ENCOUNTER — Encounter (HOSPITAL_COMMUNITY): Payer: 59 | Admitting: Physical Therapy

## 2022-09-18 IMAGING — US US ABDOMEN LIMITED
1 series · 14 of 25 positions shown · non-contrast
Comparison: None.

CLINICAL DATA: Abnormal liver function tests

EXAM:
ULTRASOUND ABDOMEN LIMITED RIGHT UPPER QUADRANT

[Series 1: us abdomen limited ruq (liver/gb) · 14 of 53 slices shown]
[im 1/53]
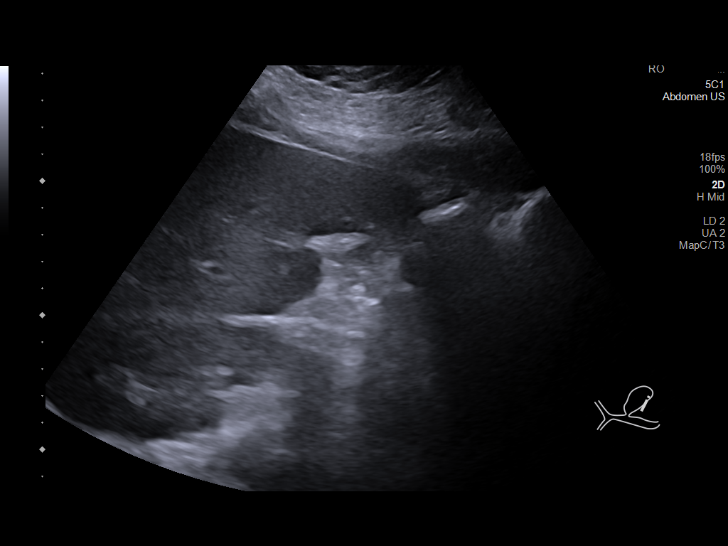
[im 5/53]
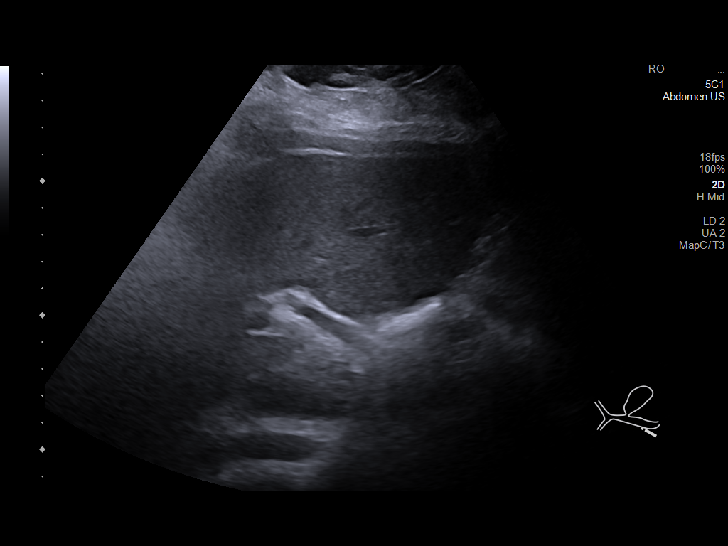
[im 9/53]
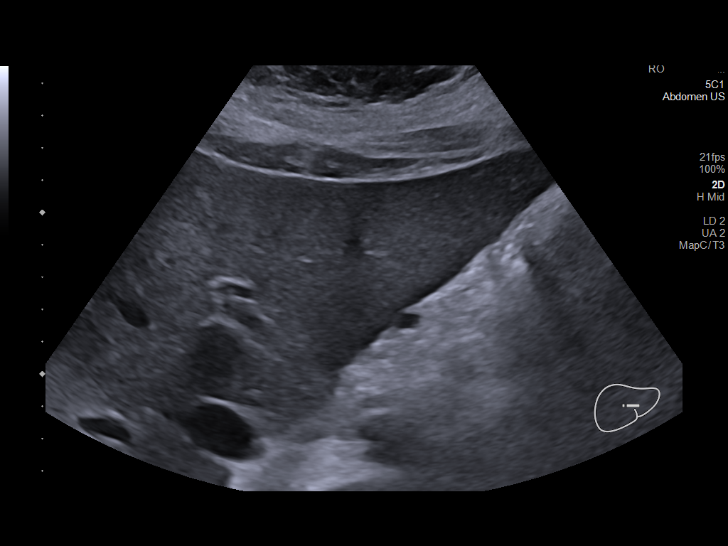
[im 14/53]
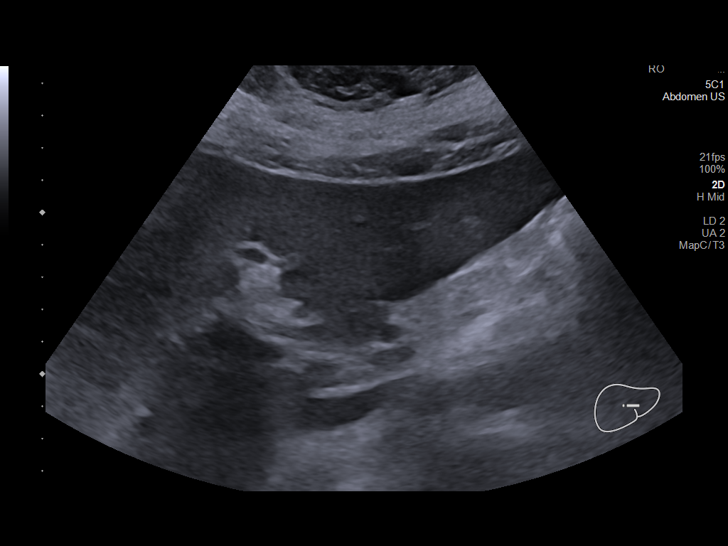
[im 18/53]
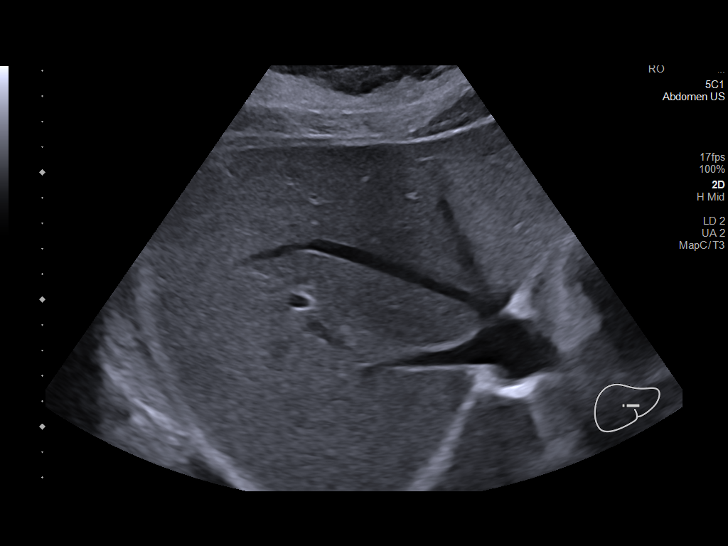
[im 20/53]
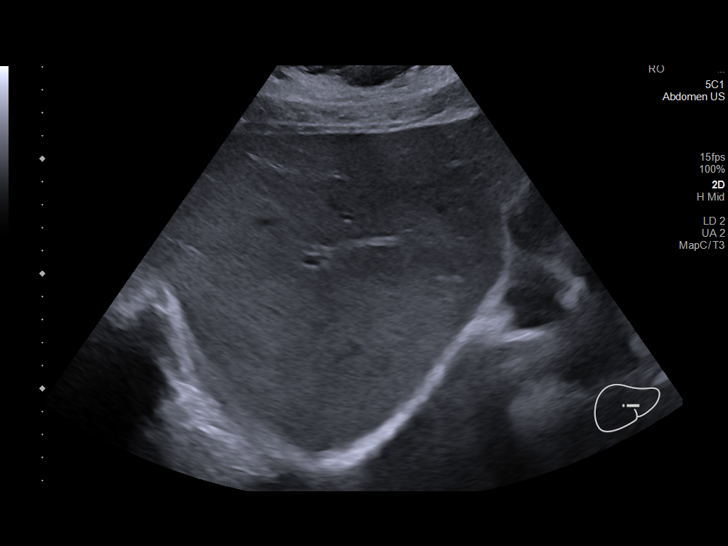
[im 24/53]
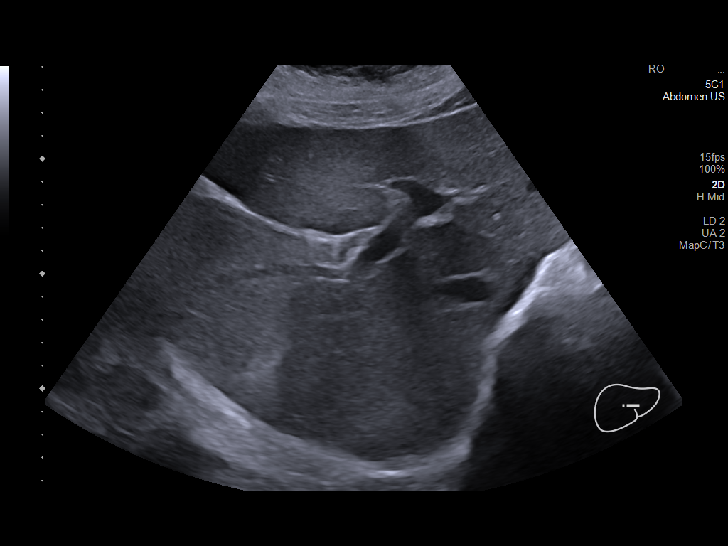
[im 29/53]
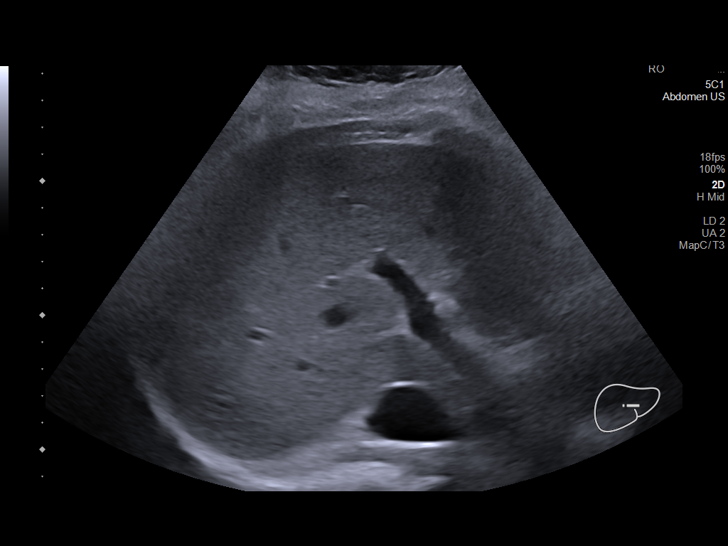
[im 33/53]
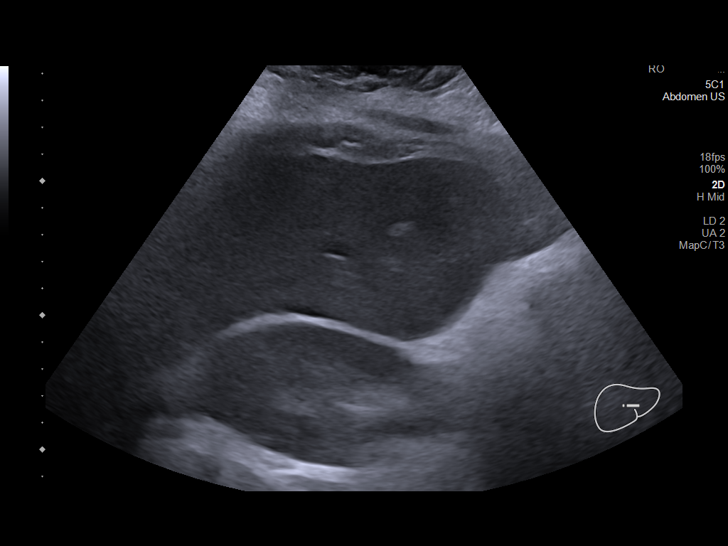
[im 35/53]
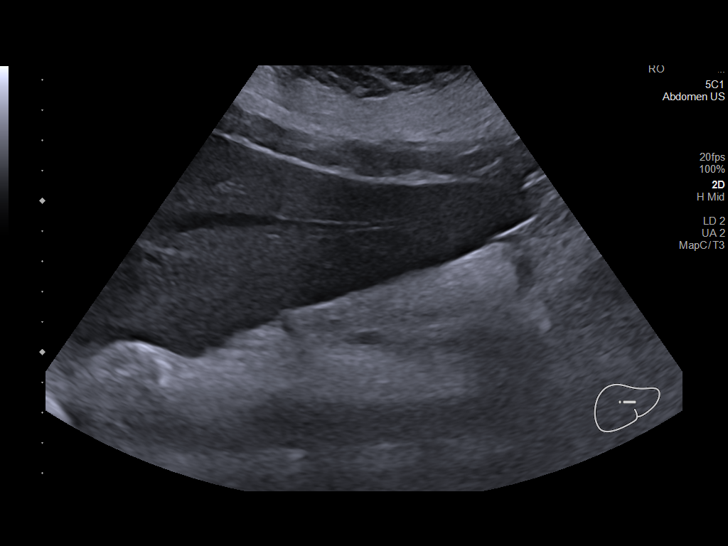
[im 40/53]
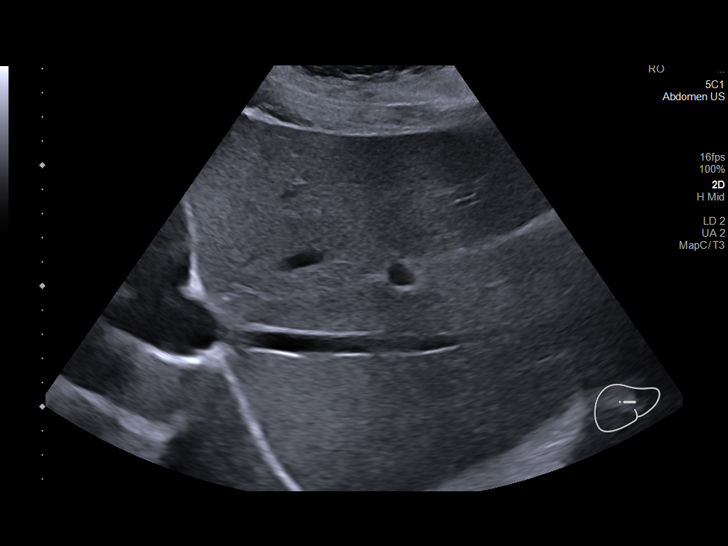
[im 44/53]
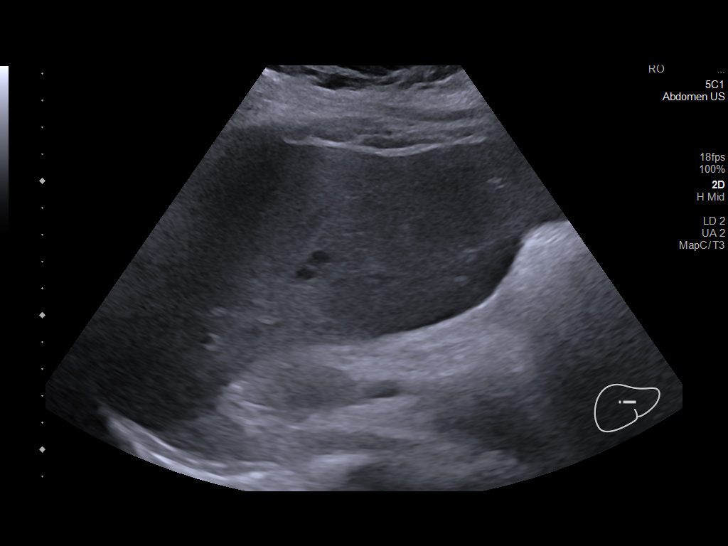
[im 48/53]
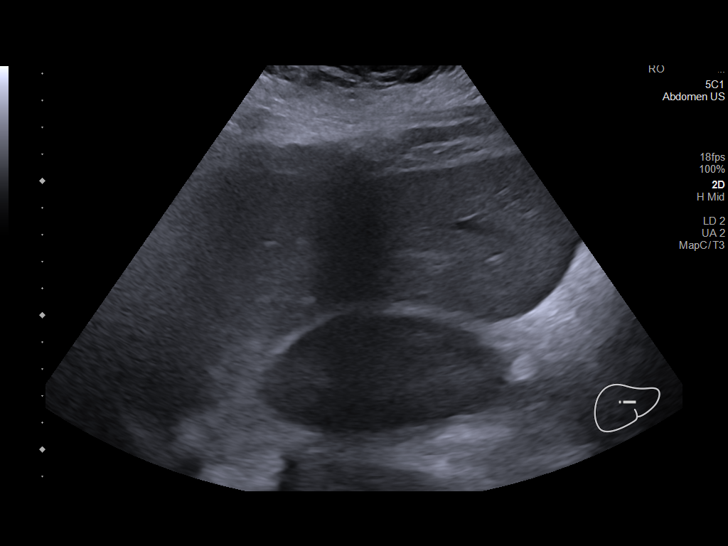
[im 53/53]
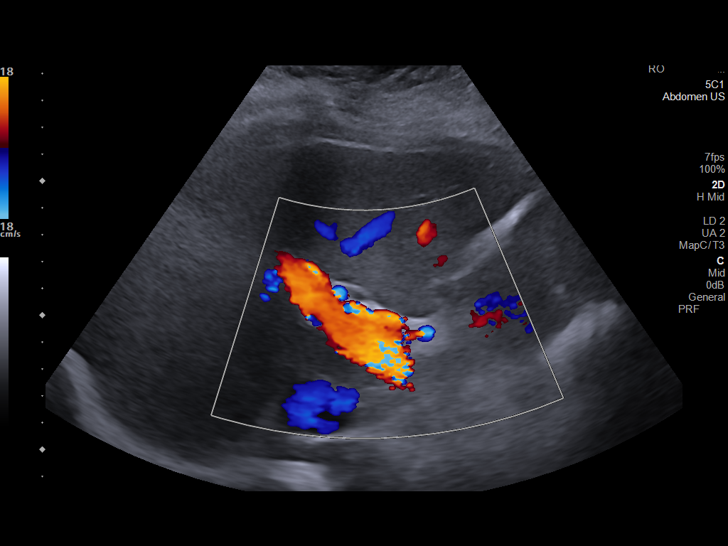

[14 of 25 positions shown; findings below may reference images not displayed]

FINDINGS: Gallbladder:

Not seen consistent with cholecystectomy.

Common bile duct:

Diameter: 5.7 mm

Liver:

There is increased echogenicity in the liver. No focal abnormality
is seen. Portal vein is patent on color Doppler imaging with normal
direction of blood flow towards the liver.

Other: None.
IMPRESSION: Status post cholecystectomy.  Enlarged fatty liver.

No other sonographic abnormality is seen in the right upper
quadrant.

## 2022-09-24 ENCOUNTER — Ambulatory Visit (HOSPITAL_COMMUNITY): Payer: 59 | Attending: Orthopaedic Surgery | Admitting: Physical Therapy

## 2022-09-24 ENCOUNTER — Ambulatory Visit: Payer: 59 | Admitting: Clinical

## 2022-09-24 ENCOUNTER — Ambulatory Visit: Payer: 59 | Admitting: Orthopaedic Surgery

## 2022-09-24 DIAGNOSIS — M5459 Other low back pain: Secondary | ICD-10-CM | POA: Insufficient documentation

## 2022-09-24 NOTE — Therapy (Signed)
OUTPATIENT PHYSICAL THERAPY TREATMENT   Patient Name: Jacqueline Alvarado MRN: [id number] DOB:06-16-1996, 27 y.o., female Today's Date: 09/24/2022  PHYSICAL THERAPY DISCHARGE SUMMARY  Visits from Start of Care: 4  Current functional level related to goals / functional outcomes: See below    Remaining deficits: See below    Education / Equipment: See assessment    Patient agrees to discharge. Patient goals were partially met. Patient is being discharged due to being pleased with the current functional level.   END OF SESSION:  PT End of Session - 09/24/22 1403     Visit Number 4    Number of Visits 12    Date for PT Re-Evaluation 09/24/22    Authorization Type [employer]    Progress Note Due on Visit 10    PT Start Time 1402    PT Stop Time 1430    PT Time Calculation (min) 28 min    Activity Tolerance Patient tolerated treatment well    Behavior During Therapy WFL for tasks assessed/performed             Past Medical History:  Diagnosis Date   Abdominal pain, chronic, right upper quadrant    Abnormal Pap smear of cervix    Anemia    iron deficiency   Anxiety    BPD (bronchopulmonary dysplasia)    Depression    Family history of neoplasm of breast 05/12/2022   Headache(784.0)    Hypertension    Papanicolaou smear of cervix with positive high risk human papilloma virus (HPV) test 05/31/2019   05/2019 repeat HPV in 1 year    PONV (postoperative nausea and vomiting)    Hx: of nausea only at age 43   Past Surgical History:  Procedure Laterality Date   CHOLECYSTECTOMY N/A 02/02/2017   Procedure: LAPAROSCOPIC CHOLECYSTECTOMY;  Surgeon: Aviva Signs, MD;  Location: [facility];  Service: General;  Laterality: N/A;   COLONOSCOPY N/A 01/07/2016   Dr. Oneida Alar: Nonbleeding internal hemorrhoids, large. 3 bands successfully placed. Distal ileum containing multiple ulcers which she felt was related to NSAIDs. Biopsies nonspecific.   HEMORRHOID BANDING N/A 01/07/2016   Procedure:  HEMORRHOID BANDING;  Surgeon: Danie Binder, MD;  Location: [facility];  Service: Endoscopy;  Laterality: N/A;   MASS EXCISION N/A 11/26/2016   Procedure: EXCISION OF ABDOMINAL CICATRIX 4CM;  Surgeon: Aviva Signs, MD;  Location: [facility];  Service: General;  Laterality: N/A;  p knows to arrive at 7:50   NASAL SEPTOPLASTY W/ TURBINOPLASTY Bilateral 06/24/2013   Procedure: TURBINATE REDUCTION;  Surgeon: Jodi Marble, MD;  Location: [facility];  Service: ENT;  Laterality: Bilateral;   TONSILLECTOMY AND ADENOIDECTOMY Bilateral 06/24/2013   Procedure: TONSILLECTOMY ;  Surgeon: Jodi Marble, MD;  Location: [facility];  Service: ENT;  Laterality: Bilateral;   TUMOR EXCISION     Hx: of right side of neck   WISDOM TOOTH EXTRACTION     Patient Active Problem List   Diagnosis Date Noted   Breast pain, left 05/12/2022   Irregular periods 05/12/2022   Anxiety and depression 05/12/2022   Pregnancy examination or test, negative result 05/12/2022   Encounter for gynecological examination with Papanicolaou smear of cervix 05/12/2022   Mass of upper inner quadrant of left breast 05/12/2022   Hidradenitis suppurativa 05/12/2022   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   Family history of neoplasm of breast 05/12/2022   Cellulitis of right thigh 09/06/2021   Elevated LFTs 09/06/2021   Essential hypertension  11/07/2020   Papanicolaou smear of cervix with positive high risk human papilloma virus (HPV) test 05/31/2019   Menorrhagia with irregular cycle 05/24/2019   Acute cholecystitis 02/01/2017   Calculus of gallbladder with acute cholecystitis without obstruction    Biliary colic    Abdominal pain 01/29/2017   Gallstones    Inflammation of the retroperitoneum    Cicatrix    Rectal bleeding 12/20/2015   Hemorrhoids 12/20/2015   Hypertrophy of nasal turbinates 06/24/2013   Tonsillar hypertrophy 06/24/2013   Obstructive sleep apnea (adult) (pediatric) 06/24/2013   Morbid obesity  ([place]) 06/24/2013    PCP: Leslie Andrea MD  REFERRING PROVIDER: Sanjuana Kava, MD  REFERRING DIAG: [phone] (ICD-10-CM) - Chronic bilateral low back pain with right-sided sciatica  Rationale for Evaluation and Treatment: Rehabilitation  THERAPY DIAG:  Other low back pain  ONSET DATE: 2020  SUBJECTIVE:                                                                                                                                                                                           SUBJECTIVE STATEMENT: Back is doing ok. Exercises have been helping. Still notices pain though.   PERTINENT HISTORY:  NA  PAIN:  Are you having pain? Yes: NPRS scale: (low back)6/10 Pain location: low back, RT knee  Pain description: pressure, strain, sharp; RT knee throbbing, aching Aggravating factors: sitting, walking, standing, stairs, prolonged positions Relieving factors: "nothing really"   PRECAUTIONS: None  WEIGHT BEARING RESTRICTIONS: No  FALLS:  Has patient fallen in last 6 months? No  LIVING ENVIRONMENT: Lives with: lives alone Lives in: House/apartment Stairs: No Has following equipment at home: None  OCCUPATION: Live in care giver   PLOF: Independent  PATIENT GOALS: "Be able to use all of my limbs as God intended"  NEXT MD VISIT:   OBJECTIVE:   DIAGNOSTIC FINDINGS:  Back and knee xrays negative   PATIENT SURVEYS:  FOTO 61% (was 47% function)   COGNITION: Overall cognitive status: Within functional limits for tasks assessed     SENSATION: Decreased sensation to light touch about LT lower arm   POSTURE: rounded shoulders  PALPATION:  Mod TTP about RT lower lumbar paraspinal   LUMBAR ROM:   AROM eval  Flexion Full P!  Extension 25 % limited P!  Right lateral flexion Full  Left lateral flexion Full  Right rotation Full P!  Left rotation Full P!   (Blank rows = not tested)  LOWER EXTREMITY ROM:     Active  Right eval Left eval  Hip  flexion    Hip extension    Hip abduction  Hip adduction    Hip internal rotation    Hip external rotation    Knee flexion 125 125  Knee extension 5 2  Ankle dorsiflexion    Ankle plantarflexion    Ankle inversion    Ankle eversion     (Blank rows = not tested)  LOWER EXTREMITY MMT:    MMT Right eval Left eval Right 09/24/22 Left 09/24/22  Hip flexion 4+ 4+ 5 5  Hip extension 3- '4 4 4  '$ Hip abduction 5 4+ 5 5  Hip adduction      Hip internal rotation      Hip external rotation      Knee flexion      Knee extension '5 5 5 5  '$ Ankle dorsiflexion '5 5 5 5  '$ Ankle plantarflexion      Ankle inversion      Ankle eversion       (Blank rows = not tested)  LUMBAR SPECIAL TESTS:  Straight leg raise test: Positive (LLE)   TODAY'S TREATMENT:                                                                                                                              DATE:  09/24/22 Reassess FOTO MMT AROM  09/01/22 Ab set 10 x 5    second holds March with Ab set 3 x 10  SLR with ab set 2 x 10 bilateral Bridge  20 x 5" Sidelying hip abduction 2 x 10 Bilateral    08/13/22 Ab set 10 x 5 second holds March with Ab set 3 x 10  SLR with ab set 2 x 10 bilateral Bridge 3 x 10 Sidelying hip abduction 2 x 10 Bilateral  Prone hip extension 2 x 10 bilateral  07/30/22 Eval    PATIENT EDUCATION:  Education details: 08/13/22: HEP;  Eval: on eval finding, and POC Person educated: Patient Education method: Explanation Education comprehension: verbalized understanding  HOME EXERCISE PROGRAM: Access Code: [email or web address] URL: [email or web address]  09/01/22 - Prone Alternating Arm and Leg Lifts  - 1 x daily - 7 x weekly - 2 sets - 10 reps  Date: 08/13/2022 - Abdominal Bracing  - 1 x daily - 7 x weekly - 3 sets - 10 reps - 5 second hold - Supine March  - 1 x daily - 7 x weekly - 3 sets - 10 reps - Supine Active Straight Leg Raise  - 1 x daily - 7 x weekly - 3 sets - 10  reps - Supine Bridge  - 1 x daily - 7 x weekly - 3 sets - 10 reps - Sidelying Hip Abduction  - 1 x daily - 7 x weekly - 2 sets - 10 reps - Prone Hip Extension  - 1 x daily - 7 x weekly - 2 sets - 10 reps  ASSESSMENT:  CLINICAL IMPRESSION: Patient has made moderate progress to therapy goals. Patient reports compliance with HEP and states that  this has been helpful when she is able to do the exercises. Strength has improved and overall subjective reporting has improved. Pain remains largely unchanged. Patient continues to be limited by back pain which negatively affects functional ability. At this time patient to be DC to return to referring provider. Answered all patient questions. Encouraged patient to follow up with therapy services within any further questions or concerns.   OBJECTIVE IMPAIRMENTS: Abnormal gait, decreased activity tolerance, decreased mobility, difficulty walking, decreased ROM, decreased strength, increased fascial restrictions, improper body mechanics, postural dysfunction, and pain.   ACTIVITY LIMITATIONS: carrying, lifting, bending, sitting, standing, squatting, stairs, transfers, and locomotion level  PARTICIPATION LIMITATIONS: meal prep, cleaning, laundry, shopping, community activity, occupation, yard work, and school  PERSONAL FACTORS:  NA  are also affecting patient's functional outcome.   REHAB POTENTIAL: Good  CLINICAL DECISION MAKING: Stable/uncomplicated  EVALUATION COMPLEXITY: Low   GOALS: SHORT TERM GOALS: Target date: 08/20/2022  Patient will be independent with initial HEP and self-management strategies to improve functional outcomes Baseline:  Goal status: MET   LONG TERM GOALS: Target date: 09/10/2022  Patient will be independent with advanced HEP and self-management strategies to improve functional outcomes Baseline:  Goal status: MET  2.  Patient will improve FOTO score to predicted value to indicate improvement in functional  outcomes Baseline: 61% Goal status: MET  3.  Patient will report reduction of back pain to <3/10 for improved quality of life and ability to perform ADLs  Baseline: 6/10 Goal status: NOT MET  4. Patient will have equal to or > 4+/5 MMT throughout BLE to improve ability to perform functional mobility, stair ambulation and ADLs.  Baseline: See MMT Goal status: Partially MET   PLAN:  PT FREQUENCY: 1-2x/week  PT DURATION: 6 weeks  PLANNED INTERVENTIONS: Therapeutic exercises, Therapeutic activity, Neuromuscular re-education, Balance training, Gait training, Patient/Family education, Joint manipulation, Joint mobilization, Stair training, Aquatic Therapy, Dry Needling, Electrical stimulation, Spinal manipulation, Spinal mobilization, Cryotherapy, Moist heat, scar mobilization, Taping, Traction, Ultrasound, Biofeedback, Ionotophoresis '4mg'$ /ml Dexamethasone, and Manual therapy. Marland Kitchen  PLAN FOR NEXT SESSION: DC to HEP   2:43 PM, 09/24/22 Josue Hector PT DPT  Physical Therapist with [facility]  [phone]
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
## 2022-09-30 ENCOUNTER — Ambulatory Visit: Payer: 59 | Admitting: Orthopaedic Surgery

## 2022-10-09 ENCOUNTER — Ambulatory Visit: Payer: 59 | Admitting: Clinical

## 2022-10-09 DIAGNOSIS — F424 Excoriation (skin-picking) disorder: Secondary | ICD-10-CM

## 2022-10-09 DIAGNOSIS — F39 Unspecified mood [affective] disorder: Secondary | ICD-10-CM

## 2022-10-09 DIAGNOSIS — F603 Borderline personality disorder: Secondary | ICD-10-CM

## 2022-10-13 NOTE — BH Specialist Note (Signed)
Error; pt requests reschedule due to schedule conflict.

## 2022-10-16 ENCOUNTER — Telehealth: Payer: Self-pay | Admitting: Orthopaedic Surgery

## 2022-10-16 NOTE — Telephone Encounter (Signed)
Dr. Brooke Bonito pt - pt lvm to reschedule her last appointment.  She also wanted to ask about her script for Hydrocodone 5-325, she stated that the pharmacy stated that they took the script back because she missed her last appointment.  She stated that she works 7 days a week and she missed her last visit appointment because she had to stay at work.  She stated she really needs this script.

## 2022-10-17 MED ORDER — HYDROCODONE-ACETAMINOPHEN 5-325 MG PO TABS: ORAL_TABLET | ORAL | 0 refills | Status: DC

## 2022-10-19 ENCOUNTER — Telehealth: Payer: 59 | Admitting: Physician Assistant

## 2022-10-19 DIAGNOSIS — R6889 Other general symptoms and signs: Secondary | ICD-10-CM | POA: Diagnosis not present

## 2022-10-20 NOTE — Progress Notes (Signed)
I have spent 5 minutes in review of e-visit questionnaire, review and updating patient chart, medical decision making and response to patient.   Keeyon Privitera Cody Morning Halberg, PA-C    

## 2022-10-20 NOTE — Progress Notes (Signed)
E visit for Flu like symptoms   We are sorry that you are not feeling well.  Here is how we plan to help! Based on what you have shared with me it looks like you may have flu-like symptoms that should be watched but do not seem to indicate anti-viral treatment. Since over 2 days you are outside the window for an antiviral medication. I do highly recommend taking an at-home COVID test to be cautious.   Influenza or "the flu" is   an infection caused by a respiratory virus. The flu virus is highly contagious and persons who did not receive their yearly flu vaccination may "catch" the flu from close contact.  We have anti-viral medications to treat the viruses that cause this infection. They are not a "cure" and only shorten the course of the infection. These prescriptions are most effective when they are given within the first 2 days of "flu" symptoms. Antiviral medication are indicated if you have a high risk of complications from the flu. You should  also consider an antiviral medication if you are in close contact with someone who is at risk. These medications can help patients avoid complications from the flu  but have side effects that you should know. Possible side effects from Tamiflu or oseltamivir include nausea, vomiting, diarrhea, dizziness, headaches, eye redness, sleep problems or other respiratory symptoms. You should not take Tamiflu if you have an allergy to oseltamivir or any to the ingredients in Tamiflu.  Based upon your symptoms and potential risk factors I recommend that you follow the flu symptoms recommendation that I have listed below.  Please keep well-hydrated and try to get plenty of rest. If you have a humidifier, place it in the bedroom and run it at night. Start a saline nasal rinse for nasal congestion. You can consider use of a nasal steroid spray like Flonase or Nasacort OTC. You can alternate between Tylenol and Ibuprofen if needed for fever, body aches, headache and/or  throat pain. Salt water-gargles and chloraseptic spray can be very beneficial for sore throat. Mucinex-DM for congestion or cough. Please take all prescribed medications as directed.  Remain out of work until [employer] for 24 hours without a fever-reducing medication, and you are feeling better.  You should mask until symptoms are resolved.  If anything worsens despite treatment, you need to be evaluated in-person. Please do not delay care.   ANYONE WHO HAS FLU SYMPTOMS SHOULD: Stay home. The flu is highly contagious and going out or to work exposes others! Be sure to drink plenty of fluids. Water is fine as well as fruit juices, sodas and electrolyte beverages. You may want to stay away from caffeine or alcohol. If you are nauseated, try taking small sips of liquids. How do you know if you are getting enough fluid? Your urine should be a pale yellow or almost colorless. Get rest. Taking a steamy shower or using a humidifier may help nasal congestion and ease sore throat pain. Using a saline nasal spray works much the same way. Cough drops, hard candies and sore throat lozenges may ease your cough. Line up a caregiver. Have someone check on you regularly.   GET HELP RIGHT AWAY IF: You cannot keep down liquids or your medications. You become short of breath Your fell like you are going to pass out or loose consciousness. Your symptoms persist after you have completed your treatment plan MAKE SURE YOU  Understand these instructions. Will watch your condition. Will get help  right away if you are not doing well or get worse.  Your e-visit answers were reviewed by a board certified advanced clinical practitioner to complete your personal care plan.  Depending on the condition, your plan could have included both over the counter or prescription medications.  If there is a problem please reply  once you have received a response from your provider.  Your safety is important to [place].  If you  have drug allergies check your prescription carefully.    You can use MyChart to ask questions about today's visit, request a non-urgent call back, or ask for a work or school excuse for 24 hours related to this e-Visit. If it has been greater than 24 hours you will need to follow up with your provider, or enter a new e-Visit to address those concerns.  You will get an e-mail in the next two days asking about your experience.  I hope that your e-visit has been valuable and will speed your recovery. Thank you for using e-visits.

## 2022-10-22 ENCOUNTER — Ambulatory Visit: Payer: 59 | Admitting: Orthopaedic Surgery

## 2022-10-22 DIAGNOSIS — L732 Hidradenitis suppurativa: Secondary | ICD-10-CM | POA: Diagnosis not present

## 2022-10-22 DIAGNOSIS — L700 Acne vulgaris: Secondary | ICD-10-CM | POA: Diagnosis not present

## 2022-10-23 ENCOUNTER — Ambulatory Visit
Admission: RE | Admit: 2022-10-23 | Discharge: 2022-10-23 | Disposition: A | Discharge status: Home / Self Care | Payer: 59 | Source: Ambulatory Visit | Attending: Adult Health | Admitting: Adult Health

## 2022-10-23 DIAGNOSIS — Z1239 Encounter for other screening for malignant neoplasm of breast: Secondary | ICD-10-CM | POA: Diagnosis not present

## 2022-10-23 DIAGNOSIS — Z803 Family history of malignant neoplasm of breast: Secondary | ICD-10-CM

## 2022-10-23 MED ORDER — GADOPICLENOL 0.5 MMOL/ML IV SOLN
10.0000 mL | Freq: Once | INTRAVENOUS | Status: AC | PRN
  Administered 2022-10-23: 10 mL via INTRAVENOUS

## 2022-10-27 ENCOUNTER — Ambulatory Visit: Payer: 59 | Admitting: Clinical

## 2022-10-27 DIAGNOSIS — Z91199 Patient's noncompliance with other medical treatment and regimen due to unspecified reason: Secondary | ICD-10-CM

## 2022-10-28 ENCOUNTER — Ambulatory Visit (INDEPENDENT_AMBULATORY_CARE_PROVIDER_SITE_OTHER): Payer: 59 | Admitting: Orthopaedic Surgery

## 2022-10-28 ENCOUNTER — Encounter: Payer: Self-pay | Admitting: Orthopaedic Surgery

## 2022-10-28 ENCOUNTER — Encounter: Payer: Self-pay | Admitting: Adult Health

## 2022-10-28 ENCOUNTER — Ambulatory Visit: Payer: 59 | Admitting: Adult Health

## 2022-10-28 VITALS — BP 146/99 | HR 60 | Ht 68.0 in | Wt 330.4 lb

## 2022-10-28 VITALS — BP 160/100 | HR 88 | Ht 68.0 in | Wt 330.0 lb

## 2022-10-28 DIAGNOSIS — L732 Hidradenitis suppurativa: Secondary | ICD-10-CM

## 2022-10-28 DIAGNOSIS — F32A Depression, unspecified: Secondary | ICD-10-CM | POA: Diagnosis not present

## 2022-10-28 DIAGNOSIS — I10 Essential (primary) hypertension: Secondary | ICD-10-CM

## 2022-10-28 DIAGNOSIS — G8929 Other chronic pain: Secondary | ICD-10-CM | POA: Diagnosis not present

## 2022-10-28 DIAGNOSIS — F419 Anxiety disorder, unspecified: Secondary | ICD-10-CM

## 2022-10-28 DIAGNOSIS — R718 Other abnormality of red blood cells: Secondary | ICD-10-CM

## 2022-10-28 DIAGNOSIS — M25561 Pain in right knee: Secondary | ICD-10-CM | POA: Diagnosis not present

## 2022-10-28 MED ORDER — SERTRALINE HCL 25 MG PO TABS: ORAL_TABLET | ORAL | 6 refills | Status: DC

## 2022-10-28 MED ORDER — LISINOPRIL 20 MG PO TABS: 20.0000 mg | ORAL_TABLET | Freq: Every day | ORAL | 6 refills | Status: DC

## 2022-10-28 MED ORDER — HYDROCODONE-ACETAMINOPHEN 5-325 MG PO TABS: ORAL_TABLET | ORAL | 0 refills | Status: DC

## 2022-10-28 MED ORDER — METHYLPREDNISOLONE ACETATE 40 MG/ML IJ SUSP
40.0000 mg | Freq: Once | INTRAMUSCULAR | Status: AC
Start: 2022-10-28 — End: 2022-10-28
  Administered 2022-10-28: 40 mg via INTRA_ARTICULAR

## 2022-10-28 MED ORDER — SULFAMETHOXAZOLE-TRIMETHOPRIM 800-160 MG PO TABS: 1.0000 | ORAL_TABLET | Freq: Two times a day (BID) | ORAL | 0 refills | Status: DC

## 2022-10-28 NOTE — Progress Notes (Signed)
My knee is no better.  She could not tolerate the PT.  She had pain and the therapist said she was not getting any benefit. She has more pain in the right knee, swelling and giving way. I will get MRI of the knee.  ROM 0 to 105, crepitus, effusion, medial positive Mc[place], limp right, NV intact.  Encounter Diagnosis  Name Primary?   Chronic pain of right knee Yes   PROCEDURE NOTE:  The patient requests injections of the right knee , verbal consent was obtained.  The right knee was prepped appropriately after time out was performed.   Sterile technique was observed and injection of 1 cc of DepoMedrol  with several cc's of plain xylocaine. Anesthesia was provided by ethyl chloride and a 20-gauge needle was used to inject the knee area. The injection was tolerated well.  A band aid dressing was applied.  The patient was advised to apply ice later today and tomorrow to the injection sight as needed.  Get MRI of the knee.  Return in three weeks.  I have reviewed the [place] Controlled Substance Reporting System web site prior to prescribing narcotic medicine for this patient.  Call if any problem.  Precautions discussed.  Electronically Signed Darreld Mclean, MD 4/16/202410:19 AM

## 2022-10-28 NOTE — Patient Instructions (Addendum)
You have chosen to have your imaging done at [facility]. You will need to call them and schedule your appointment. We will work on the approval. You MUST bring a CD and Report with you to your follow up to review imaging. You can pick up a CD with the images Monday - Friday 8-5pm. They will not burn the CD until you are there so you don't need call before you go to pick it up. You can pick up a CD with a copy of the images on it Monday-Friday between 8am and 5pm. They will not burn the CD until you are there to pick it up so you do not have to call to ask them for one.   [facility] [address] [place] [address] PHONE: [phone]  

## 2022-10-28 NOTE — Progress Notes (Signed)
Subjective:     Patient ID: Jacqueline Alvarado, female   DOB: September 27, 1995, 27 y.o.   MRN: [id number]  HPI Jacqueline Alvarado is a 27 year old black female,single, G1P0010, if for BP check, she is on 20 mg lisinopril, but did not take this morning and had injection right knee before coming in today. She has seen dermatology and he wants her on acutane and put her on BCPs. She has flare of HS too, in groin. She saw Hulda Marin at [facility] and she thinks zoloft needs increasing.     Component Value Date/Time   DIAGPAP  05/12/2022 1455    - Negative for Intraepithelial Lesions or Malignancy (NILM)   DIAGPAP - Benign reactive/reparative changes 05/12/2022 1455   DIAGPAP - Low grade squamous intraepithelial lesion (LSIL) (A) 11/07/2020 1346   HPVHIGH Negative 05/12/2022 1455   HPVHIGH Positive (A) 11/07/2020 1346   HPVHIGH Positive (A) 05/24/2019 1554   ADEQPAP  05/12/2022 1455    Satisfactory but limited for evaluation with partially obscuring blood;   ADEQPAP transformation zone component present. 05/12/2022 1455   ADEQPAP  11/07/2020 1346    Satisfactory for evaluation; transformation zone component PRESENT.   PCP is Dr Sudie Bailey   Review of Systems HS flare Has pain right knee Has lost 3 more lbs +anxiety and depression  Reviewed past medical,surgical, social and family history. Reviewed medications and allergies.     Objective:   Physical Exam BP (!) 146/99 (BP Location: Left Arm, Patient Position: Sitting, Cuff Size: Normal)   Pulse 60   Ht  (1.727 m)   Wt (!) 330 lb 6.4 oz (149.9 kg)   LMP 10/22/2022   BMI 50.24 kg/m     Skin warm and dry. Lungs: clear to ausculation bilaterally. Cardiovascular: regular rate and rhythm.  +HS in groin area. Fall risk is low  Upstream - 10/28/22 1056       Pregnancy Intention Screening   Does the patient want to become pregnant in the next year? No    Does the patient's partner want to become pregnant in the next year? No    Would the patient like to  discuss contraceptive options today? No      Contraception Wrap Up   Current Method Female Condom    End Method Oral Contraceptive             Assessment:     1. Essential hypertension Take lisinopril  every day Continue weight loss Meds ordered this encounter  Medications   lisinopril (ZESTRIL) 20 MG tablet    Sig: Take 1 tablet (20 mg total) by mouth daily.    Dispense:  30 tablet    Refill:  6    Order Specific Question:   Supervising Provider    Answer:   Duane Lope H [[id number]]   sulfamethoxazole-trimethoprim (BACTRIM DS) 800-160 MG tablet    Sig: Take 1 tablet by mouth 2 (two) times daily. Take 1 bid    Dispense:  28 tablet    Refill:  0    Order Specific Question:   Supervising Provider    Answer:   Despina Hidden, LUTHER H [[id number]]   sertraline (ZOLOFT) 25 MG tablet    Sig: Take 3 daily po    Dispense:  90 tablet    Refill:  6    Order Specific Question:   Supervising Provider    Answer:   Despina Hidden, LUTHER H [[id number]]     2. Low mean corpuscular volume (MCV) Referred to  hematology  for evaluation  - Ambulatory referral to Hematology / Oncology  3. Morbid obesity Continue weight loss Has lost 18 lbs since October 2018   4. Hidradenitis suppurativa Will rx septra ds  Keep clean and dry   5. Anxiety and depression Will increase zoloft to 75 mg 1 daily Has appt with Asher Muir next week    Plan:     Follow up in 3 months for BP check and ROS

## 2022-10-28 NOTE — BH Specialist Note (Signed)
Pt did not arrive to video visit and did not answer the phone; Left HIPPA-compliant message to call back Breland Trouten from [facility] at  [phone] (Lashia Niese's office).  ?; left MyChart message for patient.  ? ?

## 2022-10-30 ENCOUNTER — Other Ambulatory Visit: Payer: Self-pay

## 2022-10-30 ENCOUNTER — Telehealth: Payer: Self-pay | Admitting: Orthopaedic Surgery

## 2022-10-30 DIAGNOSIS — G8929 Other chronic pain: Secondary | ICD-10-CM

## 2022-10-30 NOTE — Telephone Encounter (Signed)
Dr. Sanjuan Dame pt - patient lvm stating the facilities she can go to are Radiation Oncology, [facility], [facility] and [facility].

## 2022-10-30 NOTE — Telephone Encounter (Signed)
Case uploaded to [employer]. Pending

## 2022-11-04 ENCOUNTER — Ambulatory Visit: Payer: 59 | Admitting: Clinical

## 2022-11-04 DIAGNOSIS — Z91199 Patient's noncompliance with other medical treatment and regimen due to unspecified reason: Secondary | ICD-10-CM

## 2022-11-05 DIAGNOSIS — L700 Acne vulgaris: Secondary | ICD-10-CM | POA: Diagnosis not present

## 2022-11-07 ENCOUNTER — Ambulatory Visit (INDEPENDENT_AMBULATORY_CARE_PROVIDER_SITE_OTHER): Payer: 59 | Admitting: Clinical

## 2022-11-07 DIAGNOSIS — F603 Borderline personality disorder: Secondary | ICD-10-CM | POA: Diagnosis not present

## 2022-11-07 DIAGNOSIS — F424 Excoriation (skin-picking) disorder: Secondary | ICD-10-CM

## 2022-11-07 DIAGNOSIS — F39 Unspecified mood [affective] disorder: Secondary | ICD-10-CM

## 2022-11-07 NOTE — BH Specialist Note (Signed)
Integrated Behavioral Health via Telemedicine Visit  11/07/2022 Jacqueline Alvarado [id number]  Number of Integrated Behavioral Health Clinician visits: Additional Visit  Session Start time: 1056   Session End time: 1157  Total time in minutes: 61   Referring Provider: Cyril Mourning, NP Patient/Family location: Home [facility] Provider location: [facility]  All persons participating in visit: [name] and [name]   Types of Service: Individual psychotherapy and Video visit  I connected with Jacqueline Alvarado and/or Jacqueline Alvarado's  n/a  via  Telephone or Video Enabled Telemedicine Application  (Video is Caregility application) and verified that I am speaking with the correct person using two identifiers. Discussed confidentiality: Yes   I discussed the limitations of telemedicine and the availability of in person appointments.  Discussed there is a possibility of technology failure and discussed alternative modes of communication if that failure occurs.  I discussed that engaging in this telemedicine visit, they consent to the provision of behavioral healthcare and the services will be billed under their insurance.  Patient and/or legal guardian expressed understanding and consented to Telemedicine visit: Yes   Presenting Concerns: Patient and/or family reports the following symptoms/concerns: Life stress (conflict with sister; work stress); increased gambling, increased alcohol and marijuana usage when stressed. Pt has stopped vaping two weeks ago; has started Accutane (via dermatology) and birth control, starting to notice less skin picking and breakouts. Pt is in a more stable relationship with new boyfriend; they aim to work as a team towards more stable finances and stress reduction. Pt declines psychiatry at this time; will consider in the future.  Duration of problem: Ongoing; Severity of problem:  moderately  severe  Patient and/or Family's Strengths/Protective Factors: Social connections, Concrete supports in place (healthy food, safe environments, etc.), Sense of purpose, and Physical Health (exercise, healthy diet, medication compliance, etc.)  Goals Addressed: Patient will:  Reduce symptoms of: anxiety, compulsions, depression, mood instability, and stress   Increase knowledge and/or ability of: self-management skills   Demonstrate ability to: Increase healthy adjustment to current life circumstances, Increase motivation to adhere to plan of care, and Decrease self-medicating behaviors  Progress towards Goals: Ongoing  Interventions: Interventions utilized:  Motivational Interviewing and CBT Cognitive Behavioral Therapy Standardized Assessments completed: Not Needed  Patient and/or Family Response: Patient agrees with treatment plan.   Assessment: Patient currently experiencing Borderline personality disorder, Excoriation disorder, Mood disorder..   Patient may benefit from continued therapeutic interventions.  Plan: Follow up with behavioral health clinician on : Two weeks Behavioral recommendations:  -Continue taking Zoloft, Accutane and birth control as prescribed -Continue to consider psychiatry referral in the future -Continue setting healthy boundaries in all relationships -Write letter to employer about complaints; do NOT send/keep in journal -Begin keeping log of thoughts, feelings, state of mind(sober or not) prior to deciding to gamble, for the next two weeks. Notice any patterns, as discussed. (Continue to review monetary budget daily).  -Take [employer] app off phone; Add non-gambling game app of choice on phone. When urge to gamble comes, play game app instead.  -Continue discussion with partner about household expectations and finances prior to making him a key to move in. Be very clear with one another about expectations, as discussed Referral(s): Integrated  [employer] (In Clinic)  I discussed the assessment and treatment plan with the patient and/or parent/guardian. They were provided an opportunity to ask questions and all were answered. They agreed with  the plan and demonstrated an understanding of the instructions.   They were advised to call back or seek an in-person evaluation if the symptoms worsen or if the condition fails to improve as anticipated.  Rae Lips, LCSW     09/05/2022    9:38 AM 05/12/2022    2:46 PM 11/07/2020    1:32 PM 05/24/2019    3:52 PM 05/24/2019    3:51 PM  Depression screen PHQ 2/9  Decreased Interest 2 3 0 2 2  Down, Depressed, Hopeless 3 3 1 2 2   PHQ - 2 Score 5 6 1 4 4   Altered sleeping 3 3 3 3    Tired, decreased energy 3 2 3 2    Change in appetite 2 2 3 3    Feeling bad or failure about yourself  3 3 1 2    Trouble concentrating 3 1 2 1    Moving slowly or fidgety/restless 0 1 1 2    Suicidal thoughts 1 0 1 0   PHQ-9 Score 20 18 15 17        09/05/2022    9:48 AM 05/12/2022    2:46 PM 11/07/2020    1:32 PM  GAD 7 : Generalized Anxiety Score  Nervous, Anxious, on Edge 3 3 3   Control/stop worrying 3 3 3   Worry too much - different things 3 3 3   Trouble relaxing 3 3 3   Restless 0 1 1  Easily annoyed or irritable 3 1 2   Afraid - awful might happen 3 1 1   Total GAD 7 Score 18 15 16

## 2022-11-10 NOTE — BH Specialist Note (Signed)
Pt did not arrive to video visit and did not answer the phone; Left HIPPA-compliant message to call back Arnetra Terris from [facility] at  [phone] (Kedrick Mcnamee's office).  ?; left MyChart message for patient.  ? ?

## 2022-11-12 NOTE — Progress Notes (Unsigned)
Prior Auth completed for pain medication on Covermymeds. Waiting on response.

## 2022-11-21 DIAGNOSIS — L700 Acne vulgaris: Secondary | ICD-10-CM | POA: Diagnosis not present

## 2022-11-23 ENCOUNTER — Other Ambulatory Visit: Payer: 59

## 2022-11-24 ENCOUNTER — Ambulatory Visit: Payer: 59 | Admitting: Clinical

## 2022-11-24 DIAGNOSIS — Z91199 Patient's noncompliance with other medical treatment and regimen due to unspecified reason: Secondary | ICD-10-CM

## 2022-11-25 ENCOUNTER — Inpatient Hospital Stay: Payer: 59 | Attending: Hematology | Admitting: Hematology

## 2022-12-02 ENCOUNTER — Ambulatory Visit: Payer: 59 | Admitting: Orthopaedic Surgery

## 2022-12-03 ENCOUNTER — Encounter: Payer: Self-pay | Admitting: Orthopaedic Surgery

## 2022-12-09 NOTE — BH Specialist Note (Signed)
Integrated Behavioral Health via Telemedicine Visit  12/12/2022 Jacqueline Alvarado [id number]  Number of Integrated Behavioral Health Clinician visits: Additional Visit  Session Start time: 8413   Session End time: 0851  Total time in minutes: 35   Referring Provider: Cyril Mourning, NP Patient/Family location: Home [facility] Provider location: [facility]  All persons participating in visit: [name] and [facility] Majestic Molony   Types of Service: Individual psychotherapy and Video visit  I connected with Chrystina S Britain and/or Rasheena S Jenson's  n/a  via  Telephone or Video Enabled Telemedicine Application  (Video is Caregility application) and verified that I am speaking with the correct person using two identifiers. Discussed confidentiality: Yes   I discussed the limitations of telemedicine and the availability of in person appointments.  Discussed there is a possibility of technology failure and discussed alternative modes of communication if that failure occurs.  I discussed that engaging in this telemedicine visit, they consent to the provision of behavioral healthcare and the services will be billed under their insurance.  Patient and/or legal guardian expressed understanding and consented to Telemedicine visit: Yes   Presenting Concerns: Patient and/or family reports the following symptoms/concerns: After keeping track of thoughts, feelings prior to gambling, noticing that gambling behavior occurs when tired/sleepy only. Pt also noticed that alcohol makes her feel sick on Accutane; smoking is causing lips to dry out and feel uncomfortable. Pt is discouraged that she's not getting closer to goal of obtaining car, limiting her job prospects; stress causing increased picking at face.  Duration of problem: Ongoing; Severity of problem:  moderately severe  Patient and/or Family's Strengths/Protective Factors: Social connections,  Concrete supports in place (healthy food, safe environments, etc.), Sense of purpose, and Physical Health (exercise, healthy diet, medication compliance, etc.)  Goals Addressed: Patient will:  Reduce symptoms of: anxiety, compulsions, depression, and stress   Increase knowledge and/or ability of: self-management skills and stress reduction   Demonstrate ability to: Increase healthy adjustment to current life circumstances, Increase motivation to adhere to plan of care, and Decrease self-medicating behaviors  Progress towards Goals: Ongoing  Interventions: Interventions utilized:  Motivational Interviewing and Solution-Focused Strategies Standardized Assessments completed: Not Needed  Patient and/or Family Response: Patient agrees with treatment plan.   Assessment: Patient currently experiencing Borderline personality disorder, Excoriation disorder; Mood disorder.   Patient may benefit from continued therapeutic intervention  .  Plan: Follow up with behavioral health clinician on : Three weeks Behavioral recommendations:  -Continue taking Zoloft, Accutane and birth control as prescribed; consider psychiatry as needed in the future -Continue setting healthy boundaries in all relationships; continue healthy discussions with boyfriend about need for silence upon return from work to recharge -Put together a vision board showing main goal (obtaining car) and the why for the car (second job to bring in more income); consider life as imagined in 5-10 years -Prioritize healthy sleep nightly; notice any barriers to healthy sleep -Continue keeping track of all expenditures; go through each bill and see if it can be lowered (ex: compare phone plans, etc); all reduction in expenditures can go towards car -Continue to consider how to "catch" yourself prior to picking at face (gloves or picture up as discussed) -Continue plan to discontinue alcohol and reduce smoking while taking  Accutane Referral(s): Integrated [employer] (In Clinic)  I discussed the assessment and treatment plan with the patient and/or parent/guardian. They were provided an opportunity to ask questions and  all were answered. They agreed with the plan and demonstrated an understanding of the instructions.   They were advised to call back or seek an in-person evaluation if the symptoms worsen or if the condition fails to improve as anticipated.  Rae Lips, LCSW     09/05/2022    9:38 AM 05/12/2022    2:46 PM 11/07/2020    1:32 PM 05/24/2019    3:52 PM 05/24/2019    3:51 PM  Depression screen PHQ 2/9  Decreased Interest 2 3 0 2 2  Down, Depressed, Hopeless 3 3 1 2 2   PHQ - 2 Score 5 6 1 4 4   Altered sleeping 3 3 3 3    Tired, decreased energy 3 2 3 2    Change in appetite 2 2 3 3    Feeling bad or failure about yourself  3 3 1 2    Trouble concentrating 3 1 2 1    Moving slowly or fidgety/restless 0 1 1 2    Suicidal thoughts 1 0 1 0   PHQ-9 Score 20 18 15 17        09/05/2022    9:48 AM 05/12/2022    2:46 PM 11/07/2020    1:32 PM  GAD 7 : Generalized Anxiety Score  Nervous, Anxious, on Edge 3 3 3   Control/stop worrying 3 3 3   Worry too much - different things 3 3 3   Trouble relaxing 3 3 3   Restless 0 1 1  Easily annoyed or irritable 3 1 2   Afraid - awful might happen 3 1 1   Total GAD 7 Score 18 15 16

## 2022-12-12 ENCOUNTER — Ambulatory Visit (INDEPENDENT_AMBULATORY_CARE_PROVIDER_SITE_OTHER): Payer: 59 | Admitting: Clinical

## 2022-12-12 DIAGNOSIS — F39 Unspecified mood [affective] disorder: Secondary | ICD-10-CM

## 2022-12-12 DIAGNOSIS — F424 Excoriation (skin-picking) disorder: Secondary | ICD-10-CM

## 2022-12-12 DIAGNOSIS — F603 Borderline personality disorder: Secondary | ICD-10-CM | POA: Diagnosis not present

## 2022-12-19 ENCOUNTER — Inpatient Hospital Stay: Payer: 59

## 2022-12-19 ENCOUNTER — Inpatient Hospital Stay: Payer: 59 | Attending: Hematology | Admitting: Hematology

## 2022-12-19 VITALS — BP 139/89 | HR 78 | Temp 99.6°F | Resp 17 | Ht 68.0 in | Wt 333.5 lb

## 2022-12-19 DIAGNOSIS — Z79899 Other long term (current) drug therapy: Secondary | ICD-10-CM | POA: Diagnosis not present

## 2022-12-19 DIAGNOSIS — F419 Anxiety disorder, unspecified: Secondary | ICD-10-CM | POA: Diagnosis not present

## 2022-12-19 DIAGNOSIS — Z791 Long term (current) use of non-steroidal anti-inflammatories (NSAID): Secondary | ICD-10-CM | POA: Diagnosis not present

## 2022-12-19 DIAGNOSIS — R45851 Suicidal ideations: Secondary | ICD-10-CM | POA: Diagnosis not present

## 2022-12-19 DIAGNOSIS — L700 Acne vulgaris: Secondary | ICD-10-CM | POA: Diagnosis not present

## 2022-12-19 DIAGNOSIS — D509 Iron deficiency anemia, unspecified: Secondary | ICD-10-CM | POA: Diagnosis not present

## 2022-12-19 DIAGNOSIS — M5440 Lumbago with sciatica, unspecified side: Secondary | ICD-10-CM | POA: Diagnosis not present

## 2022-12-19 DIAGNOSIS — Z808 Family history of malignant neoplasm of other organs or systems: Secondary | ICD-10-CM | POA: Insufficient documentation

## 2022-12-19 DIAGNOSIS — F17290 Nicotine dependence, other tobacco product, uncomplicated: Secondary | ICD-10-CM | POA: Insufficient documentation

## 2022-12-19 DIAGNOSIS — Z87891 Personal history of nicotine dependence: Secondary | ICD-10-CM | POA: Diagnosis not present

## 2022-12-19 DIAGNOSIS — Z832 Family history of diseases of the blood and blood-forming organs and certain disorders involving the immune mechanism: Secondary | ICD-10-CM | POA: Insufficient documentation

## 2022-12-19 DIAGNOSIS — R718 Other abnormality of red blood cells: Secondary | ICD-10-CM

## 2022-12-19 DIAGNOSIS — F320 Major depressive disorder, single episode, mild: Secondary | ICD-10-CM | POA: Diagnosis not present

## 2022-12-19 DIAGNOSIS — K625 Hemorrhage of anus and rectum: Secondary | ICD-10-CM | POA: Insufficient documentation

## 2022-12-19 DIAGNOSIS — Z6841 Body Mass Index (BMI) 40.0 and over, adult: Secondary | ICD-10-CM | POA: Diagnosis not present

## 2022-12-19 DIAGNOSIS — I10 Essential (primary) hypertension: Secondary | ICD-10-CM | POA: Diagnosis not present

## 2022-12-19 DIAGNOSIS — Z803 Family history of malignant neoplasm of breast: Secondary | ICD-10-CM | POA: Insufficient documentation

## 2022-12-19 DIAGNOSIS — F603 Borderline personality disorder: Secondary | ICD-10-CM | POA: Diagnosis not present

## 2022-12-19 LAB — CBC WITH DIFFERENTIAL/PLATELET
Abs Immature Granulocytes: 0.02 10*3/uL (ref 0.00–0.07)
Basophils Absolute: 0.1 10*3/uL (ref 0.0–0.1)
Basophils Relative: 1 %
Eosinophils Absolute: 0.2 10*3/uL (ref 0.0–0.5)
Eosinophils Relative: 3 %
HCT: 33.8 % — ABNORMAL LOW (ref 36.0–46.0)
Hemoglobin: 10.7 g/dL — ABNORMAL LOW (ref 12.0–15.0)
Immature Granulocytes: 0 %
Lymphocytes Relative: 32 %
Lymphs Abs: 2.8 10*3/uL (ref 0.7–4.0)
MCH: 22.9 pg — ABNORMAL LOW (ref 26.0–34.0)
MCHC: 31.7 g/dL (ref 30.0–36.0)
MCV: 72.2 fL — ABNORMAL LOW (ref 80.0–100.0)
Monocytes Absolute: 0.6 10*3/uL (ref 0.1–1.0)
Monocytes Relative: 7 %
Neutro Abs: 5.1 10*3/uL (ref 1.7–7.7)
Neutrophils Relative %: 57 %
Platelets: 357 10*3/uL (ref 150–400)
RBC: 4.68 MIL/uL (ref 3.87–5.11)
RDW: 15.2 % (ref 11.5–15.5)
WBC: 8.8 10*3/uL (ref 4.0–10.5)
nRBC: 0 % (ref 0.0–0.2)

## 2022-12-19 LAB — IRON AND TIBC
Iron: 38 ug/dL (ref 28–170)
Saturation Ratios: 9 % — ABNORMAL LOW (ref 10.4–31.8)
TIBC: 412 ug/dL (ref 250–450)
UIBC: 374 ug/dL

## 2022-12-19 LAB — RETICULOCYTES
Immature Retic Fract: 12.7 % (ref 2.3–15.9)
RBC.: 4.65 MIL/uL (ref 3.87–5.11)
Retic Count, Absolute: 60 10*3/uL (ref 19.0–186.0)
Retic Ct Pct: 1.3 % (ref 0.4–3.1)

## 2022-12-19 LAB — LACTATE DEHYDROGENASE: LDH: 100 U/L (ref 98–192)

## 2022-12-19 LAB — FERRITIN: Ferritin: 40 ng/mL (ref 11–307)

## 2022-12-19 LAB — FOLATE: Folate: 9.3 ng/mL (ref >5.9)

## 2022-12-19 LAB — VITAMIN B12: Vitamin B-12: 202 pg/mL (ref 180–914)
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
## 2022-12-19 NOTE — Patient Instructions (Signed)
[facility]  Discharge Instructions  You were seen and examined today by Dr. Ellin Saba. Dr. Ellin Saba is a hematologist, meaning that he specializes in blood abnormalities. Dr. Ellin Saba discussed your past medical history, family history of cancers/blood conditions and the events that led to you being here today.  You were referred to Dr. Ellin Saba due to smaller than normal red blood cells.  Dr. Ellin Saba has recommended additional labs today for further evaluation.  Follow-up as scheduled.  Thank you for choosing [facility] to provide your oncology and hematology care.   To afford each patient quality time with our provider, please arrive at least 15 minutes before your scheduled appointment time. You may need to reschedule your appointment if you arrive late (10 or more minutes). Arriving late affects you and other patients whose appointments are after yours.  Also, if you miss three or more appointments without notifying the office, you may be dismissed from the clinic at the provider's discretion.    Again, thank you for choosing [facility].  Our hope is that these requests will decrease the amount of time that you wait before being seen by our physicians.   If you have a lab appointment with the Cancer Center - please note that after April 8th, all labs will be drawn in the cancer center.  You do not have to check in or register with the main entrance as you have in the past but will complete your check-in at the cancer center.            _____________________________________________________________  Should you have questions after your visit to [facility], please contact our office at [phone] and follow the prompts.  Our office hours are 8:00 a.m. to 4:30 p.m. Monday - Thursday and 8:00 a.m. to 2:30 p.m. Friday.  Please note that voicemails left after 4:00 p.m. may not be returned until the  following business day.  We are closed weekends and all major holidays.  You do have access to a nurse 24-7, just call the main number to the clinic [phone] and do not press any options, hold on the line and a nurse will answer the phone.    For prescription refill requests, have your pharmacy contact our office and allow 72 hours.    Masks are no longer required in the cancer centers. If you would like for your care team to wear a mask while they are taking care of you, please let them know. You may have one support person who is at least 29 years old accompany you for your appointments.

## 2022-12-22 LAB — METHYLMALONIC ACID, SERUM: Methylmalonic Acid, Quantitative: 136 nmol/L (ref 0–378)

## 2022-12-23 LAB — HGB FRACTIONATION CASCADE
Hgb A2: 5.1 % — ABNORMAL HIGH (ref 1.8–3.2)
Hgb A: 92.7 % — ABNORMAL LOW (ref 96.4–98.8)
Hgb F: 2.2 % — ABNORMAL HIGH (ref 0.0–2.0)
Hgb S: 0 %

## 2022-12-24 DIAGNOSIS — L853 Xerosis cutis: Secondary | ICD-10-CM | POA: Diagnosis not present

## 2022-12-24 DIAGNOSIS — K130 Diseases of lips: Secondary | ICD-10-CM | POA: Diagnosis not present

## 2022-12-24 DIAGNOSIS — L700 Acne vulgaris: Secondary | ICD-10-CM | POA: Diagnosis not present

## 2022-12-24 LAB — COPPER, SERUM: Copper: 237 ug/dL — ABNORMAL HIGH (ref 80–158)

## 2023-01-01 NOTE — BH Specialist Note (Signed)
Pt requests to reschedule; pt and [facility] will reschedule appointment at mutually agreed upon date/time.

## 2023-01-02 ENCOUNTER — Ambulatory Visit: Payer: 59 | Admitting: Clinical

## 2023-01-04 ENCOUNTER — Ambulatory Visit
Admission: RE | Admit: 2023-01-04 | Discharge: 2023-01-04 | Disposition: A | Discharge status: Home / Self Care | Payer: 59 | Source: Ambulatory Visit | Attending: Orthopaedic Surgery | Admitting: Orthopaedic Surgery

## 2023-01-04 DIAGNOSIS — M48061 Spinal stenosis, lumbar region without neurogenic claudication: Secondary | ICD-10-CM | POA: Diagnosis not present

## 2023-01-04 DIAGNOSIS — M65861 Other synovitis and tenosynovitis, right lower leg: Secondary | ICD-10-CM | POA: Diagnosis not present

## 2023-01-04 DIAGNOSIS — G8929 Other chronic pain: Secondary | ICD-10-CM

## 2023-01-04 DIAGNOSIS — M948X6 Other specified disorders of cartilage, lower leg: Secondary | ICD-10-CM | POA: Diagnosis not present

## 2023-01-04 DIAGNOSIS — M25461 Effusion, right knee: Secondary | ICD-10-CM | POA: Diagnosis not present

## 2023-01-04 DIAGNOSIS — M5127 Other intervertebral disc displacement, lumbosacral region: Secondary | ICD-10-CM | POA: Diagnosis not present

## 2023-01-06 ENCOUNTER — Other Ambulatory Visit: Payer: 59

## 2023-01-06 NOTE — BH Specialist Note (Signed)
Integrated Behavioral Health via Telemedicine Visit  01/06/2023 [name] [id number]  Number of Integrated Behavioral Health Clinician visits: Additional Visit  Session Start time: 4098   Session End time: 0851  Total time in minutes: 35   Referring Provider: Cyril Mourning, NP Patient/Family location: [facility] Provider location: [facility]  All persons participating in visit: [name] and [facility] Kea Callan   Types of Service: Individual psychotherapy and Video visit  I connected with Jacqueline Alvarado and/or Jacqueline Alvarado's  n/a  via  Telephone or Video Enabled Telemedicine Application  (Video is Caregility application) and verified that I am speaking with the correct person using two identifiers. Discussed confidentiality: Yes   I discussed the limitations of telemedicine and the availability of in person appointments.  Discussed there is a possibility of technology failure and discussed alternative modes of communication if that failure occurs.  I discussed that engaging in this telemedicine visit, they consent to the provision of behavioral healthcare and the services will be billed under their insurance.  Patient and/or legal guardian expressed understanding and consented to Telemedicine visit: Yes   Presenting Concerns: Patient and/or family reports the following symptoms/concerns: Conflicting emotions regarding both current workplace and relationship; motivated to begin "30 day cleanse" with friends (no smoking, no alcohol, no gambling, no sex; clean eating, etc); has gone one full week with no picking her face.  Duration of problem: Ongoing; Severity of problem: moderate  Patient and/or Family's Strengths/Protective Factors: Social connections, Concrete supports in place (healthy food, safe environments, etc.), Sense of purpose, and Physical Health (exercise, healthy diet, medication compliance,  etc.)  Goals Addressed: Patient will:  Reduce symptoms of: anxiety, compulsions, depression, and stress   Increase knowledge and/or ability of: stress reduction   Demonstrate ability to: Increase motivation to adhere to plan of care  Progress towards Goals: Ongoing  Interventions: Interventions utilized:  Supportive Reflection Standardized Assessments completed: Not Needed  Patient and/or Family Response: Patient agrees with treatment plan.   Assessment: Patient currently experiencing Borderline personality disorder, Excoriation disorder; Mood disorder, unspecified.   Patient may benefit from continued therapeutic intervention  .  Plan: Follow up with behavioral health clinician on : Two weeks Behavioral recommendations:  -Continue taking Zoloft, Accutane and birth control as prescribed; continue to consider psychiatry as needed in the future -Continue all the healthy self-coping strategies (setting and keeping healthy boundaries in relationships, working on vision board, 30 day cleanse with friends to increase healthy habits, maintaining financial accountability to self) -Begin keeping track in journal the thoughts and feelings that come up prior to urge to break your "cleanse". Maintain contact with friend group daily for encouragement and support.  Referral(s): Integrated [employer] (In Clinic)  I discussed the assessment and treatment plan with the patient and/or parent/guardian. They were provided an opportunity to ask questions and all were answered. They agreed with the plan and demonstrated an understanding of the instructions.   They were advised to call back or seek an in-person evaluation if the symptoms worsen or if the condition fails to improve as anticipated.  Rae Lips, LCSW     09/05/2022    9:38 AM 05/12/2022    2:46 PM 11/07/2020    1:32 PM 05/24/2019    3:52 PM 05/24/2019    3:51 PM  Depression screen PHQ 2/9  Decreased Interest 2  3 0 2 2  Down, Depressed, Hopeless 3 3 1  2 2  PHQ - 2 Score 5 6 1 4 4   Altered sleeping 3 3 3 3    Tired, decreased energy 3 2 3 2    Change in appetite 2 2 3 3    Feeling bad or failure about yourself  3 3 1 2    Trouble concentrating 3 1 2 1    Moving slowly or fidgety/restless 0 1 1 2    Suicidal thoughts 1 0 1 0   PHQ-9 Score 20 18 15 17        09/05/2022    9:48 AM 05/12/2022    2:46 PM 11/07/2020    1:32 PM  GAD 7 : Generalized Anxiety Score  Nervous, Anxious, on Edge 3 3 3   Control/stop worrying 3 3 3   Worry too much - different things 3 3 3   Trouble relaxing 3 3 3   Restless 0 1 1  Easily annoyed or irritable 3 1 2   Afraid - awful might happen 3 1 1   Total GAD 7 Score 18 15 16

## 2023-01-07 ENCOUNTER — Inpatient Hospital Stay (HOSPITAL_BASED_OUTPATIENT_CLINIC_OR_DEPARTMENT_OTHER): Payer: 59 | Admitting: Oncology

## 2023-01-07 VITALS — BP 120/90 | HR 76 | Temp 99.5°F | Resp 16 | Wt 341.5 lb

## 2023-01-07 DIAGNOSIS — Z808 Family history of malignant neoplasm of other organs or systems: Secondary | ICD-10-CM | POA: Diagnosis not present

## 2023-01-07 DIAGNOSIS — Z79899 Other long term (current) drug therapy: Secondary | ICD-10-CM | POA: Diagnosis not present

## 2023-01-07 DIAGNOSIS — F17290 Nicotine dependence, other tobacco product, uncomplicated: Secondary | ICD-10-CM | POA: Diagnosis not present

## 2023-01-07 DIAGNOSIS — D509 Iron deficiency anemia, unspecified: Secondary | ICD-10-CM | POA: Diagnosis not present

## 2023-01-07 DIAGNOSIS — Z832 Family history of diseases of the blood and blood-forming organs and certain disorders involving the immune mechanism: Secondary | ICD-10-CM | POA: Diagnosis not present

## 2023-01-07 DIAGNOSIS — Z803 Family history of malignant neoplasm of breast: Secondary | ICD-10-CM | POA: Diagnosis not present

## 2023-01-07 DIAGNOSIS — K625 Hemorrhage of anus and rectum: Secondary | ICD-10-CM | POA: Diagnosis not present
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
## 2023-01-13 ENCOUNTER — Ambulatory Visit: Payer: 59 | Admitting: Orthopaedic Surgery

## 2023-01-14 ENCOUNTER — Ambulatory Visit (INDEPENDENT_AMBULATORY_CARE_PROVIDER_SITE_OTHER): Payer: 59 | Admitting: Clinical

## 2023-01-14 DIAGNOSIS — F603 Borderline personality disorder: Secondary | ICD-10-CM

## 2023-01-14 DIAGNOSIS — F424 Excoriation (skin-picking) disorder: Secondary | ICD-10-CM

## 2023-01-14 DIAGNOSIS — F39 Unspecified mood [affective] disorder: Secondary | ICD-10-CM

## 2023-01-16 NOTE — BH Specialist Note (Unsigned)
Pt did not arrive to video visit and did not answer the phone; Left HIPPA-compliant message to call back Asher Muir from [employer] for [employer] at [facility] at  [phone] [facility] office).  ; left MyChart message for patient.

## 2023-01-19 ENCOUNTER — Inpatient Hospital Stay: Payer: 59 | Attending: Hematology

## 2023-01-19 ENCOUNTER — Inpatient Hospital Stay: Payer: 59

## 2023-01-19 VITALS — BP 163/87 | HR 75 | Temp 99.6°F | Resp 20

## 2023-01-19 DIAGNOSIS — D509 Iron deficiency anemia, unspecified: Secondary | ICD-10-CM | POA: Insufficient documentation

## 2023-01-19 MED ORDER — DIPHENHYDRAMINE HCL 25 MG PO CAPS: 50.0000 mg | ORAL_CAPSULE | Freq: Once | ORAL | Status: DC

## 2023-01-19 MED ORDER — LORATADINE 10 MG PO TABS: 10.0000 mg | ORAL_TABLET | Freq: Once | ORAL | Status: DC

## 2023-01-19 MED ORDER — SODIUM CHLORIDE 0.9 % IV SOLN: Freq: Once | INTRAVENOUS | Status: AC

## 2023-01-19 MED ORDER — METHYLPREDNISOLONE SODIUM SUCC 125 MG IJ SOLR
125.0000 mg | Freq: Once | INTRAMUSCULAR | Status: AC
  Administered 2023-01-19: 125 mg via INTRAVENOUS
  Filled 2023-01-19: qty 2

## 2023-01-19 MED ORDER — FAMOTIDINE IN NACL 20-0.9 MG/50ML-% IV SOLN
20.0000 mg | Freq: Once | INTRAVENOUS | Status: AC
  Administered 2023-01-19: 20 mg via INTRAVENOUS
  Filled 2023-01-19: qty 50

## 2023-01-19 MED ORDER — SODIUM CHLORIDE 0.9 % IV SOLN
50.0000 mg | Freq: Once | INTRAVENOUS | Status: AC
  Administered 2023-01-19: 50 mg via INTRAVENOUS
  Filled 2023-01-19: qty 1

## 2023-01-19 MED ORDER — ACETAMINOPHEN 325 MG PO TABS: 650.0000 mg | ORAL_TABLET | Freq: Once | ORAL | Status: DC

## 2023-01-19 MED ORDER — ACETAMINOPHEN 325 MG PO TABS
650.0000 mg | ORAL_TABLET | Freq: Once | ORAL | Status: AC
  Administered 2023-01-19: 650 mg via ORAL
  Filled 2023-01-19: qty 2

## 2023-01-19 MED ORDER — CETIRIZINE HCL 10 MG/ML IV SOLN
10.0000 mg | Freq: Once | INTRAVENOUS | Status: AC
  Administered 2023-01-19: 10 mg via INTRAVENOUS
  Filled 2023-01-19: qty 1

## 2023-01-19 MED ORDER — SODIUM CHLORIDE 0.9 % IV SOLN
950.0000 mg | Freq: Once | INTRAVENOUS | Status: AC
  Administered 2023-01-19: 950 mg via INTRAVENOUS
  Filled 2023-01-19: qty 19

## 2023-01-19 NOTE — Progress Notes (Signed)
Infed 1st dose given today per MD orders. Tolerated infusion without adverse affects. Vital signs stable. No complaints at this time. Discharged from clinic ambulatory in stable condition.  Patient went to the restroom and told the tech that she was having loose stool/ diarrhea . Reported to R. Pennington PA. Monitor patient another 30 minutes and may discharge home. Patient declined monitoring and states she has had her gallbladder removed and sometimes new medications cause diarrhea. Printed off discharge instructions for Infed and possible side effects. Understanding verbalized. Alert and oriented x 3. F/U with [facility] as scheduled.

## 2023-01-19 NOTE — Patient Instructions (Signed)
[facility]  Discharge Instructions: Thank you for choosing [facility] to provide your oncology and hematology care.  If you have a lab appointment with the Cancer Center - please note that after April 8th, 2024, all labs will be drawn in the cancer center.  You do not have to check in or register with the main entrance as you have in the past but will complete your check-in in the cancer center.  Wear comfortable clothing and clothing appropriate for easy access to any Portacath or PICC line.   We strive to give you quality time with your provider. You may need to reschedule your appointment if you arrive late (15 or more minutes).  Arriving late affects you and other patients whose appointments are after yours.  Also, if you miss three or more appointments without notifying the office, you may be dismissed from the clinic at the provider's discretion.      For prescription refill requests, have your pharmacy contact our office and allow 72 hours for refills to be completed.    Today you received the following chemotherapy and/or immunotherapy agents Infed. Iron Dextran Injection What is this medication? IRON DEXTRAN (EYE ern DEX tran) treats low levels of iron in your body. Iron is a mineral that plays an important role in making red blood cells, which carry oxygen from your lungs to the rest of your body. This medicine may be used for other purposes; ask your health care provider or pharmacist if you have questions. COMMON BRAND NAME(S): Dexferrum, INFeD What should I tell my care team before I take this medication? They need to know if you have any of these conditions: Anemia not caused by low iron levels Heart disease High levels of iron in the blood Kidney disease Liver disease An unusual or allergic reaction to iron, other medications, foods, dyes, or preservatives Pregnant or trying to get pregnant Breastfeeding How should I use this  medication? This medication is for injection into a vein or a muscle. It is given in a hospital or clinic setting. Talk to your care team about the use of this medication in children. While this medication may be prescribed for children as young as 66 months old for selected conditions, precautions do apply. Overdosage: If you think you have taken too much of this medicine contact a poison control center or emergency room at once. NOTE: This medicine is only for you. Do not share this medicine with others. What if I miss a dose? Keep appointments for follow-up doses. It is important not to miss your dose. Call your care team if you are unable to keep an appointment. What may interact with this medication? Do not take this medication with any of the following: Deferoxamine Dimercaprol Other iron products This medication may also interact with the following: Chloramphenicol Deferasirox This list may not describe all possible interactions. Give your health care provider a list of all the medicines, herbs, non-prescription drugs, or dietary supplements you use. Also tell them if you smoke, drink alcohol, or use illegal drugs. Some items may interact with your medicine. What should I watch for while using this medication? Visit your care team for regular checks on your progress. Tell your care team if your symptoms do not start to get better or if they get worse. You may need blood work while taking this medication. You may need to eat more foods that contain iron. Talk to your care team. Foods that contain iron  include whole grains/cereals, dried fruits, beans, peas, leafy green vegetables, and organ meats (liver, kidney). Long-term use of this medication may increase your risk of some cancers. Talk to your care team about your risk of cancer. What side effects may I notice from receiving this medication? Side effects that you should report to your care team as soon as possible: Allergic  reactions--skin rash, itching, hives, swelling of the face, lips, tongue, or throat Low blood pressure--dizziness, feeling faint or lightheaded, blurry vision Shortness of breath Side effects that usually do not require medical attention (report to your care team if they continue or are bothersome): Flushing Headache Joint pain Muscle pain Nausea Pain, redness, or irritation at injection site This list may not describe all possible side effects. Call your doctor for medical advice about side effects. You may report side effects to FDA at 1-800-FDA-1088. Where should I keep my medication? This medication is given in a hospital or clinic. It will not be stored at home. NOTE: This sheet is a summary. It may not cover all possible information. If you have questions about this medicine, talk to your doctor, pharmacist, or health care provider.  2024 Elsevier/Gold Standard (2022-01-08 00:00:00)       To help prevent nausea and vomiting after your treatment, we encourage you to take your nausea medication as directed.  BELOW ARE SYMPTOMS THAT SHOULD BE REPORTED IMMEDIATELY: *FEVER GREATER THAN 100.4 F (38 C) OR HIGHER *CHILLS OR SWEATING *NAUSEA AND VOMITING THAT IS NOT CONTROLLED WITH YOUR NAUSEA MEDICATION *UNUSUAL SHORTNESS OF BREATH *UNUSUAL BRUISING OR BLEEDING *URINARY PROBLEMS (pain or burning when urinating, or frequent urination) *BOWEL PROBLEMS (unusual diarrhea, constipation, pain near the anus) TENDERNESS IN MOUTH AND THROAT WITH OR WITHOUT PRESENCE OF ULCERS (sore throat, sores in mouth, or a toothache) UNUSUAL RASH, SWELLING OR PAIN  UNUSUAL VAGINAL DISCHARGE OR ITCHING   Items with * indicate a potential emergency and should be followed up as soon as possible or go to the Emergency Department if any problems should occur.  Please show the CHEMOTHERAPY ALERT CARD or IMMUNOTHERAPY ALERT CARD at check-in to the Emergency Department and triage nurse.  Should you have  questions after your visit or need to cancel or reschedule your appointment, please contact [facility] [phone]  and follow the prompts.  Office hours are 8:00 a.m. to 4:30 p.m. Monday - Friday. Please note that voicemails left after 4:00 p.m. may not be returned until the following business day.  We are closed weekends and major holidays. You have access to a nurse at all times for urgent questions. Please call the main number to the clinic [phone] and follow the prompts.  For any non-urgent questions, you may also contact your provider using MyChart. We now offer e-Visits for anyone 51 and older to request care online for non-urgent symptoms. For details visit [email or web address].   Also download the MyChart app! Go to the app store, search "MyChart", open the app, select [facility], and log in with your MyChart username and password.

## 2023-01-20 ENCOUNTER — Encounter: Payer: Self-pay | Admitting: Orthopaedic Surgery

## 2023-01-20 ENCOUNTER — Telehealth: Payer: Self-pay | Admitting: Orthopaedic Surgery

## 2023-01-20 ENCOUNTER — Ambulatory Visit: Payer: 59 | Admitting: Orthopaedic Surgery

## 2023-01-20 DIAGNOSIS — M25461 Effusion, right knee: Secondary | ICD-10-CM | POA: Diagnosis not present

## 2023-01-20 DIAGNOSIS — M25561 Pain in right knee: Secondary | ICD-10-CM | POA: Diagnosis not present

## 2023-01-20 DIAGNOSIS — G8929 Other chronic pain: Secondary | ICD-10-CM

## 2023-01-20 DIAGNOSIS — Z6841 Body Mass Index (BMI) 40.0 and over, adult: Secondary | ICD-10-CM | POA: Diagnosis not present

## 2023-01-20 DIAGNOSIS — M5441 Lumbago with sciatica, right side: Secondary | ICD-10-CM

## 2023-01-20 MED ORDER — METHYLPREDNISOLONE ACETATE 40 MG/ML IJ SUSP
40.0000 mg | Freq: Once | INTRAMUSCULAR | Status: AC
  Administered 2023-01-20: 40 mg via INTRA_ARTICULAR

## 2023-01-20 MED ORDER — HYDROCODONE-ACETAMINOPHEN 5-325 MG PO TABS: ORAL_TABLET | ORAL | 0 refills | Status: DC

## 2023-01-20 MED ORDER — METHYLPREDNISOLONE ACETATE 40 MG/ML IJ SUSP: 40.0000 mg | Freq: Once | INTRAMUSCULAR | Status: DC

## 2023-01-20 NOTE — Addendum Note (Signed)
Addended by: Michaele Offer on: 01/20/2023 11:37 AM   Modules accepted: Orders

## 2023-01-20 NOTE — Telephone Encounter (Signed)
Dr. Sanjuan Dame pt - pt was seen this morning, lvm stating that she was supposed to be referred to a neurosurgeon and she hasn't heard anything yet.

## 2023-01-20 NOTE — Progress Notes (Signed)
My back and knee hurt.  She had MRI of the right knee showing: IMPRESSION: 1. Focal full-thickness cartilage defect in the lateral patellar facet with subchondral cystic changes. 2. Moderate knee joint effusion with synovitis. 3. Menisci are intact. Cruciate and collateral ligaments are intact. Quadriceps tendon and patellar tendon are intact. No evidence of fracture or osteonecrosis.  I have explained the finding to her.  I have shown her a model.  This will be difficult to treat.  I have shown her exercises to do to strengthen the quads.  ROM of the right knee is 0 to 110, pain laterally over patella, crepitus, slight effusion, stable.  NV intact. Slight limp to the right.  She also had MRI of the back showing: IMPRESSION: 1. No evidence of fracture or subluxation. 2. L5-S1 disc height loss and right paracentral disc extrusion. Moderate bilateral lateral recess stenosis. Moderate bilateral neural foraminal stenosis.   I have explained these findings as well.  I have used a model.  I will have neurosurgeon see her.  She may benefit from epidural.  Spine/Pelvis examination:  Inspection:  Overall, sacoiliac joint benign and hips nontender; without crepitus or defects.   Thoracic spine inspection: Alignment normal without kyphosis present   Lumbar spine inspection:  Alignment  with normal lumbar lordosis, without scoliosis apparent.   Thoracic spine palpation:  without tenderness of spinal processes   Lumbar spine palpation: without tenderness of lumbar area; without tightness of lumbar muscles    Range of Motion:   Lumbar flexion, forward flexion is normal without pain or tenderness    Lumbar extension is full without pain or tenderness   Left lateral bend is normal without pain or tenderness   Right lateral bend is normal without pain or tenderness   Straight leg raising is normal  Strength & tone: normal   Stability overall normal stability  Encounter Diagnoses  Name  Primary?   Chronic bilateral low back pain with right-sided sciatica Yes   Chronic pain of right knee    Body mass index 50.0-59.9, adult (HCC)    Morbid obesity (HCC)    I have refilled her pain medicine.  I have reviewed the [place] Controlled Substance Reporting System web site prior to prescribing narcotic medicine for this patient.  Return in six weeks.  PROCEDURE NOTE:  The patient requests injections of the right knee , verbal consent was obtained.  The right knee was prepped appropriately after time out was performed.   Sterile technique was observed and injection of 1 cc of DepoMedrol 40mg  with several cc's of plain xylocaine. Anesthesia was provided by ethyl chloride and a 20-gauge needle was used to inject the knee area. The injection was tolerated well.  A band aid dressing was applied.  The patient was advised to apply ice later today and tomorrow to the injection sight as needed. Call if any problem.  Precautions discussed.  Electronically Signed Darreld Mclean, MD 7/9/202410:49 AM

## 2023-01-20 NOTE — Addendum Note (Signed)
Addended by: Michaele Offer on: 01/20/2023 11:58 AM   Modules accepted: Orders

## 2023-01-21 ENCOUNTER — Telehealth: Payer: Self-pay

## 2023-01-21 ENCOUNTER — Ambulatory Visit: Payer: 59 | Admitting: Gastroenterology
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
## 2023-01-22 MED ORDER — HYDROCODONE-ACETAMINOPHEN 5-325 MG PO TABS: ORAL_TABLET | ORAL | 0 refills | Status: AC

## 2023-01-23 NOTE — Telephone Encounter (Signed)
completed

## 2023-01-28 ENCOUNTER — Ambulatory Visit: Payer: 59 | Admitting: Clinical

## 2023-01-28 ENCOUNTER — Ambulatory Visit: Payer: 59 | Admitting: Adult Health

## 2023-01-28 ENCOUNTER — Ambulatory Visit: Payer: 59 | Admitting: Gastroenterology

## 2023-01-28 DIAGNOSIS — L7211 Pilar cyst: Secondary | ICD-10-CM | POA: Diagnosis not present

## 2023-01-28 DIAGNOSIS — L853 Xerosis cutis: Secondary | ICD-10-CM | POA: Diagnosis not present

## 2023-01-28 DIAGNOSIS — Z79899 Other long term (current) drug therapy: Secondary | ICD-10-CM | POA: Diagnosis not present

## 2023-01-28 DIAGNOSIS — L700 Acne vulgaris: Secondary | ICD-10-CM | POA: Diagnosis not present

## 2023-01-28 DIAGNOSIS — Z91199 Patient's noncompliance with other medical treatment and regimen due to unspecified reason: Secondary | ICD-10-CM

## 2023-01-28 DIAGNOSIS — K130 Diseases of lips: Secondary | ICD-10-CM | POA: Diagnosis not present

## 2023-02-03 ENCOUNTER — Other Ambulatory Visit: Payer: Self-pay | Admitting: *Deleted

## 2023-02-03 DIAGNOSIS — L700 Acne vulgaris: Secondary | ICD-10-CM | POA: Diagnosis not present

## 2023-02-03 DIAGNOSIS — L853 Xerosis cutis: Secondary | ICD-10-CM | POA: Diagnosis not present

## 2023-02-03 DIAGNOSIS — D509 Iron deficiency anemia, unspecified: Secondary | ICD-10-CM

## 2023-02-03 DIAGNOSIS — K130 Diseases of lips: Secondary | ICD-10-CM | POA: Diagnosis not present

## 2023-02-03 DIAGNOSIS — Z79899 Other long term (current) drug therapy: Secondary | ICD-10-CM | POA: Diagnosis not present

## 2023-02-04 DIAGNOSIS — M5126 Other intervertebral disc displacement, lumbar region: Secondary | ICD-10-CM | POA: Diagnosis not present

## 2023-02-04 DIAGNOSIS — Z6841 Body Mass Index (BMI) 40.0 and over, adult: Secondary | ICD-10-CM | POA: Diagnosis not present

## 2023-02-04 DIAGNOSIS — M5136 Other intervertebral disc degeneration, lumbar region: Secondary | ICD-10-CM | POA: Diagnosis not present

## 2023-02-05 NOTE — BH Specialist Note (Signed)
Integrated Behavioral Health via Telemedicine Visit  02/11/2023 [name] [id number]  Number of Integrated Behavioral Health Clinician visits: Additional Visit  Session Start time: 1024   Session End time: 1055  Total time in minutes: 31   Referring Provider: Cyril Mourning, NP Patient/Family location: Home Jacqueline Alvarado Provider location: [facility]  All persons participating in visit: [name] and [facility] Macera   Types of Service: Individual psychotherapy and Video visit  I connected with Marla S Frechette and/or Jaimarie S Sahagian's  n/a  via  Telephone or Video Enabled Telemedicine Application  (Video is Caregility application) and verified that I am speaking with the correct person using two identifiers. Discussed confidentiality: Yes   I discussed the limitations of telemedicine and the availability of in person appointments.  Discussed there is a possibility of technology failure and discussed alternative modes of communication if that failure occurs.  I discussed that engaging in this telemedicine visit, they consent to the provision of behavioral healthcare and the Alvarado will be billed under their insurance.  Patient and/or legal guardian expressed understanding and consented to Telemedicine visit: Yes   Presenting Concerns: Patient and/or family reports the following symptoms/concerns: Work and relationship "taking an emotional toll"; first cutting episode in over a year due to emotional stress. Pt has decreased skin picking, no gambling, and smoked only once in the past month.  Duration of problem: Ongoing; Severity of problem: moderate  Patient and/or Family's Strengths/Protective Factors: Social connections, Concrete supports in place (healthy food, safe environments, etc.), Sense of purpose, and Physical Health (exercise, healthy diet, medication compliance, etc.)  Goals Addressed: Patient will:  Reduce  symptoms of: anxiety, compulsions, depression, and stress    Demonstrate ability to: Increase motivation to adhere to plan of care  Progress towards Goals: Ongoing  Interventions: Interventions utilized:  Motivational Interviewing and Supportive Reflection Standardized Assessments completed: Not Needed  Patient and/or Family Response: Patient agrees with treatment plan.   Assessment: Patient currently experiencing Borderline personality disorder; Excoriation disorder  Patient may benefit from continued therapeutic intervention  .  Plan: Follow up with behavioral health clinician on : Two weeks Behavioral recommendations:  -Continue taking Zoloft, Accutane and birth control as prescribed; continue to consider psychiatry as neeed in the future -Continue healthy self-coping strategies that have helped improve emotional wellness in the past (healthy boundaries, vision board, restart 30 day healthy habits with friends, journal, financial accountability) -Consider online application to establish with DBT counselor at [employer], as discussed Referral(s): Integrated [employer] (In Clinic) and Counselor  I discussed the assessment and treatment plan with the patient and/or parent/guardian. They were provided an opportunity to ask questions and all were answered. They agreed with the plan and demonstrated an understanding of the instructions.   They were advised to call back or seek an in-person evaluation if the symptoms worsen or if the condition fails to improve as anticipated.  Jacqueline Lips, LCSW     09/05/2022    9:38 AM 05/12/2022    2:46 PM 11/07/2020    1:32 PM 05/24/2019    3:52 PM 05/24/2019    3:51 PM  Depression screen PHQ 2/9  Decreased Interest 2 3 0 2 2  Down, Depressed, Hopeless 3 3 1 2 2   PHQ - 2 Score 5 6 1 4 4   Altered sleeping 3 3 3 3    Tired, decreased energy 3 2 3 2    Change in appetite  2 2 3 3    Feeling bad or failure about  yourself  3 3 1 2    Trouble concentrating 3 1 2 1    Moving slowly or fidgety/restless 0 1 1 2    Suicidal thoughts 1 0 1 0   PHQ-9 Score 20 18 15 17        09/05/2022    9:48 AM 05/12/2022    2:46 PM 11/07/2020    1:32 PM  GAD 7 : Generalized Anxiety Score  Nervous, Anxious, on Edge 3 3 3   Control/stop worrying 3 3 3   Worry too much - different things 3 3 3   Trouble relaxing 3 3 3   Restless 0 1 1  Easily annoyed or irritable 3 1 2   Afraid - awful might happen 3 1 1   Total GAD 7 Score 18 15 16

## 2023-02-10 ENCOUNTER — Inpatient Hospital Stay: Payer: 59

## 2023-02-11 ENCOUNTER — Ambulatory Visit (INDEPENDENT_AMBULATORY_CARE_PROVIDER_SITE_OTHER): Payer: 59 | Admitting: Clinical

## 2023-02-11 DIAGNOSIS — F603 Borderline personality disorder: Secondary | ICD-10-CM

## 2023-02-11 DIAGNOSIS — F424 Excoriation (skin-picking) disorder: Secondary | ICD-10-CM

## 2023-02-11 NOTE — Patient Instructions (Addendum)
[facility] at [facility] [address] [place], [place] [address] [phone] (main office) [phone] (Alanmichael Barmore's office)  [email or web address]  [email or web address]

## 2023-02-12 ENCOUNTER — Inpatient Hospital Stay: Payer: 59

## 2023-02-12 NOTE — BH Specialist Note (Signed)
Integrated Behavioral Health via Telemedicine Visit  02/25/2023 Jacqueline Alvarado [id number]  Number of Integrated Behavioral Health Clinician visits: Additional Visit  Session Start time: 1611   Session End time: 1632  Total time in minutes: 21   Referring Provider: Cyril Mourning, NP Patient/Family location: Home [facility] Provider location: [facility]  All persons participating in visit: [name] and [facility]     Types of Service: Individual psychotherapy and Video visit  I connected with Jacqueline Alvarado and/or Jacqueline Alvarado's  n/a  via  Telephone or Video Enabled Telemedicine Application  (Video is Caregility application) and verified that I am speaking with the correct person using two identifiers. Discussed confidentiality: Yes   I discussed the limitations of telemedicine and the availability of in person appointments.  Discussed there is a possibility of technology failure and discussed alternative modes of communication if that failure occurs.  I discussed that engaging in this telemedicine visit, they consent to the provision of behavioral healthcare and the services will be billed under their insurance.  Patient and/or legal guardian expressed understanding and consented to Telemedicine visit: Yes   Presenting Concerns: Patient and/or family reports the following symptoms/concerns: Increased work stress affecting mood daily the past few weeks; working on [employer], journaling and requested paid time off for September to cope.  Duration of problem: Ongoing with increasing work stress; Severity of problem: moderate  Patient and/or Family's Strengths/Protective Factors: Social connections, Concrete supports in place (healthy food, safe environments, etc.), Sense of purpose, and Physical Health (exercise, healthy diet, medication compliance, etc.)  Goals Addressed: Patient will:  Reduce symptoms of:  anxiety, depression, and stress   Increase knowledge and/or ability of: stress reduction   Demonstrate ability to: Increase healthy adjustment to current life circumstances and Increase motivation to adhere to plan of care  Progress towards Goals: Ongoing  Interventions: Interventions utilized:  Supportive Reflection Standardized Assessments completed: Not Needed  Patient and/or Family Response: Patient agrees with treatment plan.   Assessment: Patient currently experiencing Borderline personality disorder; Excoriation disorder.   Patient may benefit from continued therapeutic intervention  .  Plan: Follow up with behavioral health clinician on : Two weeks Behavioral recommendations:  -Continue plan to apply for ongoing therapy at [facility] -Continue using daily self-coping strategies (journal, DBT workbook, plan time for PTO, etc.) Referral(s): [facility] [employer] (In Clinic)  I discussed the assessment and treatment plan with the patient and/or parent/guardian. They were provided an opportunity to ask questions and all were answered. They agreed with the plan and demonstrated an understanding of the instructions.   They were advised to call back or seek an in-person evaluation if the symptoms worsen or if the condition fails to improve as anticipated.  Jacqueline Lips, LCSW     09/05/2022    9:38 AM 05/12/2022    2:46 PM 11/07/2020    1:32 PM 05/24/2019    3:52 PM 05/24/2019    3:51 PM  Depression screen PHQ 2/9  Decreased Interest 2 3 0 2 2  Down, Depressed, Hopeless 3 3 1 2 2   PHQ - 2 Score 5 6 1 4 4   Altered sleeping 3 3 3 3    Tired, decreased energy 3 2 3 2    Change in appetite 2 2 3 3    Feeling bad or failure about yourself  3 3 1 2    Trouble concentrating 3 1 2 1    Moving slowly  or fidgety/restless 0 1 1 2    Suicidal thoughts 1 0 1 0   PHQ-9 Score 20 18 15 17        09/05/2022    9:48 AM 05/12/2022    2:46 PM 11/07/2020    1:32  PM  GAD 7 : Generalized Anxiety Score  Nervous, Anxious, on Edge 3 3 3   Control/stop worrying 3 3 3   Worry too much - different things 3 3 3   Trouble relaxing 3 3 3   Restless 0 1 1  Easily annoyed or irritable 3 1 2   Afraid - awful might happen 3 1 1   Total GAD 7 Score 18 15 16

## 2023-02-13 ENCOUNTER — Ambulatory Visit: Payer: 59 | Admitting: Gastroenterology
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
## 2023-02-16 ENCOUNTER — Encounter: Payer: Self-pay | Admitting: Gastroenterology

## 2023-02-17 ENCOUNTER — Inpatient Hospital Stay: Payer: 59 | Admitting: Oncology

## 2023-02-17 ENCOUNTER — Ambulatory Visit: Payer: 59 | Admitting: Adult Health

## 2023-02-25 ENCOUNTER — Ambulatory Visit (INDEPENDENT_AMBULATORY_CARE_PROVIDER_SITE_OTHER): Payer: 59 | Admitting: Clinical

## 2023-02-25 DIAGNOSIS — F424 Excoriation (skin-picking) disorder: Secondary | ICD-10-CM

## 2023-02-25 DIAGNOSIS — F603 Borderline personality disorder: Secondary | ICD-10-CM

## 2023-03-03 ENCOUNTER — Ambulatory Visit: Payer: 59 | Admitting: Orthopaedic Surgery

## 2023-03-04 DIAGNOSIS — K130 Diseases of lips: Secondary | ICD-10-CM | POA: Diagnosis not present

## 2023-03-04 DIAGNOSIS — L853 Xerosis cutis: Secondary | ICD-10-CM | POA: Diagnosis not present

## 2023-03-04 DIAGNOSIS — L700 Acne vulgaris: Secondary | ICD-10-CM | POA: Diagnosis not present

## 2023-03-05 ENCOUNTER — Inpatient Hospital Stay: Payer: 59 | Admitting: Oncology

## 2023-03-06 ENCOUNTER — Encounter: Payer: Self-pay | Admitting: Obstetrics & Gynecology

## 2023-03-06 ENCOUNTER — Ambulatory Visit: Payer: 59 | Admitting: Obstetrics & Gynecology

## 2023-03-06 VITALS — BP 133/71 | HR 114 | Ht 68.0 in | Wt 331.8 lb

## 2023-03-06 DIAGNOSIS — N764 Abscess of vulva: Secondary | ICD-10-CM

## 2023-03-06 DIAGNOSIS — L732 Hidradenitis suppurativa: Secondary | ICD-10-CM | POA: Diagnosis not present

## 2023-03-06 MED ORDER — SULFAMETHOXAZOLE-TRIMETHOPRIM 800-160 MG PO TABS
1.0000 | ORAL_TABLET | Freq: Two times a day (BID) | ORAL | 0 refills | Status: AC
Start: 2023-03-06 — End: 2023-03-13

## 2023-03-06 NOTE — Progress Notes (Signed)
GYN VISIT Patient name: Jacqueline Alvarado MRN [id number]  Date of birth: Apr 20, 1996 Chief Complaint:   Bartholin cyst now draining  History of Present Illness:   Jacqueline Alvarado is a 27 y.o. G36P0010 female being seen today for the following concerns:  -Vulvar cyst: Patient notes history of at bedtime and longstanding issue of a vulvar cyst.  She thinks it may be a Bartholin's and of note it just started draining last night.  She states this typically grape sized however over the last week or so it has increased.  Denies fevers or chills.  No other acute complaints  -HS: Followed by dermatology currently on Accutane  -Currently on OCPs for contraception  No LMP recorded.    Review of Systems:   Pertinent items are noted in HPI Denies fever/chills, dizziness, headaches, visual disturbances, fatigue, shortness of breath, chest pain, abdominal pain, vomiting, no problems with periods, bowel movements, urination, or intercourse unless otherwise stated above.  Pertinent History Reviewed:   Past Surgical History:  Procedure Laterality Date   CHOLECYSTECTOMY N/A 02/02/2017   Procedure: LAPAROSCOPIC CHOLECYSTECTOMY;  Surgeon: Franky Macho, MD;  Location: [facility];  Service: General;  Laterality: N/A;   COLONOSCOPY N/A 01/07/2016   Dr. Darrick Penna: Nonbleeding internal hemorrhoids, large. 3 bands successfully placed. Distal ileum containing multiple ulcers which she felt was related to NSAIDs. Biopsies nonspecific.   HEMORRHOID BANDING N/A 01/07/2016   Procedure: HEMORRHOID BANDING;  Surgeon: West Bali, MD;  [facility];  Service: Endoscopy;  Laterality: N/A;   MASS EXCISION N/A 11/26/2016   Procedure: EXCISION OF ABDOMINAL CICATRIX 4CM;  Surgeon: Franky Macho, MD;  Location: [facility];  Service: General;  Laterality: N/A;  p knows to arrive at 7:50   NASAL SEPTOPLASTY W/ TURBINOPLASTY Bilateral 06/24/2013   Procedure: TURBINATE REDUCTION;  Surgeon: Flo Shanks, MD;  Location[facility] OR;   Service: ENT;  Laterality: Bilateral;   TONSILLECTOMY AND ADENOIDECTOMY Bilateral 06/24/2013   Procedure: TONSILLECTOMY ;  Surgeon: Flo Shanks, MD;  Location: [facility] OR;  Service: ENT;  Laterality: Bilateral;   TUMOR EXCISION     Hx: of right side of neck   WISDOM TOOTH EXTRACTION      Past Medical History:  Diagnosis Date   Abdominal pain, chronic, right upper quadrant    Abnormal Pap smear of cervix    Anemia    iron deficiency   Anxiety    BPD (bronchopulmonary dysplasia)    Depression    Family history of neoplasm of breast 05/12/2022   Headache(784.0)    Hypertension    Papanicolaou smear of cervix with positive high risk human papilloma virus (HPV) test 05/31/2019   05/2019 repeat HPV in 1 year    PONV (postoperative nausea and vomiting)    Hx: of nausea only at age 72   Reviewed problem list, medications and allergies. Physical Assessment:   Vitals:   03/06/23 0950  BP: 133/71  Pulse: (!) 114  Weight: (!) 331 lb 12.8 oz (150.5 kg)  Height: 5\' 8"  (1.727 m)  Body mass index is 50.45 kg/m.       Physical Examination:   General appearance: alert, well appearing, and in no distress  Psych: mood appropriate, normal affect  Skin: warm & dry   Cardiovascular: normal heart rate noted  Respiratory: normal respiratory effort, no distress  Abdomen: obese, soft, non-tender   Pelvic: VULVA: Left labia majora with 5 x 4 x 2 cm abscess appreciated positive induration no erythema significantly tender  to palpation.  Small pin sized point of drainage noted Extremities: no edema   Chaperone: Jobe Marker    I&D  Procedure Written informed consent was obtained.  Discussed complications and possible outcomes of procedure including recurrence of cyst, scarring leading to infection, bleeding, dyspareunia, distortion of anatomy.  Patient was examined in the dorsal lithotomy position and mass was identified.  The area was prepped with Betadine. 1% Lidocaine (3 ml) was then used to  infiltrate area on top of the cyst. A 1cm incision was made using a sterile scapel. Drainage of serosangunious fluid was noted.  Samples of the drainage were sent for cultures. The open cyst was then copiously irrigated with normal saline.  Iodoform packing was placed.  Patient tolerated the procedure well.     Assessment & Plan:  1) Vulvar abscess -I&D completed as above -cultures sent -Bactrim x 7 days -reviewed self-packing (if possible) -f/u on Monday.  Pt given a set of pick-ups to help with packing.  []  she is aware she will need to return them to the office  Meds ordered this encounter  Medications   sulfamethoxazole-trimethoprim (BACTRIM DS) 800-160 MG tablet    Sig: Take 1 tablet by mouth 2 (two) times daily for 7 days.    Dispense:  14 tablet    Refill:  0     Return for I&D follow up- if possible Monday (at latest Tuesday).   Myna Hidalgo, DO Attending Obstetrician & Gynecologist, [facility] for [employer], [facility]

## 2023-03-09 LAB — WOUND CULTURE: Organism ID, Bacteria: NONE SEEN

## 2023-03-09 NOTE — BH Specialist Note (Deleted)
Integrated Behavioral Health via Telemedicine Visit  03/09/2023 Jacqueline Alvarado [id number]  Number of Integrated Behavioral Health Clinician visits: Additional Visit  Session Start time: 1611   Session End time: 1632  Total time in minutes: 21   Referring Provider: *** Patient/Family location: [facility] Provider location: *** All persons participating in visit: *** Types of Service: {CHL AMB TYPE OF SERVICE:[phone]}  I connected with Jacqueline Alvarado and/or Jacqueline Alvarado's {family members:20773} via  Telephone or Video Enabled Telemedicine Application  (Video is Caregility application) and verified that I am speaking with the correct person using two identifiers. Discussed confidentiality: {YES/NO:21197}  I discussed the limitations of telemedicine and the availability of in person appointments.  Discussed there is a possibility of technology failure and discussed alternative modes of communication if that failure occurs.  I discussed that engaging in this telemedicine visit, they consent to the provision of behavioral healthcare and the services will be billed under their insurance.  Patient and/or legal guardian expressed understanding and consented to Telemedicine visit: {YES/NO:21197}  Presenting Concerns: Patient and/or family reports the following symptoms/concerns: *** Duration of problem: ***; Severity of problem: {Mild/Moderate/Severe:20260}  Patient and/or Family's Strengths/Protective Factors: {CHL AMB BH PROTECTIVE [phone]}  Goals Addressed: Patient will:  Reduce symptoms of: {IBH Symptoms:21014056}   Increase knowledge and/or ability of: {IBH Patient Tools:21014057}   Demonstrate ability to: {IBH Goals:21014053}  Progress towards Goals: {CHL AMB BH PROGRESS TOWARDS GOALS:[phone]}  Interventions: Interventions utilized:  {IBH Interventions:21014054} Standardized Assessments completed: {IBH Screening Tools:21014051}  Patient and/or Family  Response: ***  Assessment: Patient currently experiencing ***.   Patient may benefit from ***.  Plan: Follow up with behavioral health clinician on : *** Behavioral recommendations: *** Referral(s): {IBH Referrals:21014055}  I discussed the assessment and treatment plan with the patient and/or parent/guardian. They were provided an opportunity to ask questions and all were answered. They agreed with the plan and demonstrated an understanding of the instructions.   They were advised to call back or seek an in-person evaluation if the symptoms worsen or if the condition fails to improve as anticipated.  Valetta Close [place] Natarajan, LCSW

## 2023-03-10 ENCOUNTER — Ambulatory Visit: Payer: 59 | Admitting: Obstetrics & Gynecology

## 2023-03-11 ENCOUNTER — Encounter: Payer: Self-pay | Admitting: Obstetrics & Gynecology

## 2023-03-11 ENCOUNTER — Ambulatory Visit: Payer: 59 | Admitting: Orthopedic Surgery

## 2023-03-11 ENCOUNTER — Ambulatory Visit: Payer: 59 | Admitting: Obstetrics & Gynecology

## 2023-03-11 DIAGNOSIS — M5416 Radiculopathy, lumbar region: Secondary | ICD-10-CM | POA: Diagnosis not present

## 2023-03-12 ENCOUNTER — Inpatient Hospital Stay: Payer: 59 | Attending: Hematology | Admitting: Oncology

## 2023-03-12 ENCOUNTER — Inpatient Hospital Stay: Payer: 59

## 2023-03-12 ENCOUNTER — Encounter: Payer: Self-pay | Admitting: Oncology

## 2023-03-12 VITALS — BP 115/81 | HR 96 | Temp 98.6°F | Resp 18 | Ht 68.0 in | Wt 325.1 lb

## 2023-03-12 DIAGNOSIS — Z792 Long term (current) use of antibiotics: Secondary | ICD-10-CM | POA: Diagnosis not present

## 2023-03-12 DIAGNOSIS — R7989 Other specified abnormal findings of blood chemistry: Secondary | ICD-10-CM | POA: Diagnosis not present

## 2023-03-12 DIAGNOSIS — R790 Abnormal level of blood mineral: Secondary | ICD-10-CM

## 2023-03-12 DIAGNOSIS — D509 Iron deficiency anemia, unspecified: Secondary | ICD-10-CM

## 2023-03-12 LAB — IRON AND TIBC
Iron: 83 ug/dL (ref 28–170)
Saturation Ratios: 21 % (ref 10.4–31.8)
TIBC: 399 ug/dL (ref 250–450)
UIBC: 316 ug/dL

## 2023-03-12 LAB — CBC WITH DIFFERENTIAL/PLATELET
Abs Immature Granulocytes: 0.05 10*3/uL (ref 0.00–0.07)
Basophils Absolute: 0 10*3/uL (ref 0.0–0.1)
Basophils Relative: 0 %
Eosinophils Absolute: 0 10*3/uL (ref 0.0–0.5)
Eosinophils Relative: 0 %
HCT: 37.2 % (ref 36.0–46.0)
Hemoglobin: 11.6 g/dL — ABNORMAL LOW (ref 12.0–15.0)
Immature Granulocytes: 0 %
Lymphocytes Relative: 18 %
Lymphs Abs: 2.3 10*3/uL (ref 0.7–4.0)
MCH: 22.6 pg — ABNORMAL LOW (ref 26.0–34.0)
MCHC: 31.2 g/dL (ref 30.0–36.0)
MCV: 72.5 fL — ABNORMAL LOW (ref 80.0–100.0)
Monocytes Absolute: 0.8 10*3/uL (ref 0.1–1.0)
Monocytes Relative: 6 %
Neutro Abs: 9.7 10*3/uL — ABNORMAL HIGH (ref 1.7–7.7)
Neutrophils Relative %: 76 %
Platelets: 434 10*3/uL — ABNORMAL HIGH (ref 150–400)
RBC: 5.13 MIL/uL — ABNORMAL HIGH (ref 3.87–5.11)
RDW: 15.9 % — ABNORMAL HIGH (ref 11.5–15.5)
WBC: 12.8 10*3/uL — ABNORMAL HIGH (ref 4.0–10.5)
nRBC: 0 % (ref 0.0–0.2)

## 2023-03-12 LAB — COMPREHENSIVE METABOLIC PANEL
ALT: 19 U/L (ref 0–44)
AST: 18 U/L (ref 15–41)
Albumin: 3.8 g/dL (ref 3.5–5.0)
Alkaline Phosphatase: 27 U/L — ABNORMAL LOW (ref 38–126)
Anion gap: 7 (ref 5–15)
BUN: 13 mg/dL (ref 6–20)
CO2: 23 mmol/L (ref 22–32)
Calcium: 9.1 mg/dL (ref 8.9–10.3)
Chloride: 103 mmol/L (ref 98–111)
Creatinine, Ser: 0.76 mg/dL (ref 0.44–1.00)
GFR, Estimated: >60 mL/min (ref >60)
Glucose, Bld: 99 mg/dL (ref 70–99)
Potassium: 4.6 mmol/L (ref 3.5–5.1)
Sodium: 133 mmol/L — ABNORMAL LOW (ref 135–145)
Total Bilirubin: 0.5 mg/dL (ref 0.3–1.2)
Total Protein: 7.9 g/dL (ref 6.5–8.1)

## 2023-03-12 LAB — FERRITIN
Ferritin: 224 ng/mL (ref 11–307)
Ferritin: 228 ng/mL (ref 11–307)

## 2023-03-12 LAB — VITAMIN B12: Vitamin B-12: 335 pg/mL (ref 180–914)
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
## 2023-03-12 NOTE — Addendum Note (Signed)
Addended by: Geradine Girt B on: 03/12/2023 11:12 AM   Modules accepted: Orders

## 2023-03-14 LAB — COPPER, SERUM: Copper: 243 ug/dL — ABNORMAL HIGH (ref 80–158)

## 2023-03-17 ENCOUNTER — Ambulatory Visit: Payer: 59 | Admitting: Orthopaedic Surgery

## 2023-03-18 NOTE — Telephone Encounter (Signed)
FYI

## 2023-03-24 ENCOUNTER — Encounter: Payer: Self-pay | Admitting: Hematology

## 2023-03-24 ENCOUNTER — Encounter: Payer: Self-pay | Admitting: Oncology

## 2023-03-24 NOTE — BH Specialist Note (Signed)
Integrated Behavioral Health via Telemedicine Visit  04/02/2023 ALIENE SCHILT [id number]  Number of Integrated Behavioral Health Clinician visits: Additional Visit  Session Start time: 0821   Session End time: 0852  Total time in minutes: 31   Referring Provider:  Patient/Family location: Cyril Mourning, NP [facility] Provider location: Home All persons participating in visit: [facility]  Types of Service: Individual psychotherapy and Video visit  I connected with Toryn S Woolbright and/or Neola S Bissonette's  n/a  via  Telephone or Video Enabled Telemedicine Application  (Video is Caregility application) and verified that I am speaking with the correct person using two identifiers. Discussed confidentiality: Yes   I discussed the limitations of telemedicine and the availability of in person appointments.  Discussed there is a possibility of technology failure and discussed alternative modes of communication if that failure occurs.  I discussed that engaging in this telemedicine visit, they consent to the provision of behavioral healthcare and the services will be billed under their insurance.  Patient and/or legal guardian expressed understanding and consented to Telemedicine visit: Yes   Presenting Concerns: Patient and/or family reports the following symptoms/concerns: Ongoing work (actively seeking new position) and life stress; increased worry over health (high copper levels, fatigue, no appetite, not feeling well); skin-picking and gambling decreased but still a struggle; processing relationship issues; working on finding work/life/home balance. Duration of problem: Ongoing; Severity of problem: moderate  Patient and/or Family's Strengths/Protective Factors: Social connections, Concrete supports in place (healthy food, safe environments, etc.), Sense of purpose, and Physical Health (exercise, healthy diet, medication compliance,  etc.)  Goals Addressed: Patient will:  Reduce symptoms of: anxiety, compulsions, depression, and stress    Demonstrate ability to: Increase motivation to adhere to plan of care  Progress towards Goals: Ongoing  Interventions: Interventions utilized:  Motivational Interviewing and Supportive Reflection Standardized Assessments completed: Not Needed  Patient and/or Family Response: Patient agrees with treatment plan.   Assessment: Patient currently experiencing Borderline personality disorder; Excoriation disorder; Mood disorder, unspecified  Patient may benefit from continued therapeutic intervention.  Plan: Follow up with behavioral health clinician on : Two weeks Behavioral recommendations:  -Continue plan to apply for ongoing DBT Therapy at [facility] -Continue using daily self-coping strategies (journal, DBT workbook, time with niece/boyfriend on weekend off work, Nurse, mental health in [employer], using [employer], etc.) -Continue plan to attend all upcoming medical appointments; advocating for appropriate healthcare Referral(s): Integrated [employer] (In Clinic)  I discussed the assessment and treatment plan with the patient and/or parent/guardian. They were provided an opportunity to ask questions and all were answered. They agreed with the plan and demonstrated an understanding of the instructions.   They were advised to call back or seek an in-person evaluation if the symptoms worsen or if the condition fails to improve as anticipated.  Valetta Close Alegra Rost, LCSW

## 2023-03-25 ENCOUNTER — Ambulatory Visit: Payer: 59 | Admitting: Orthopaedic Surgery

## 2023-03-25 ENCOUNTER — Inpatient Hospital Stay: Payer: 59 | Attending: Hematology

## 2023-03-25 ENCOUNTER — Other Ambulatory Visit: Payer: Self-pay | Admitting: *Deleted

## 2023-03-25 DIAGNOSIS — R790 Abnormal level of blood mineral: Secondary | ICD-10-CM

## 2023-03-25 DIAGNOSIS — D509 Iron deficiency anemia, unspecified: Secondary | ICD-10-CM | POA: Insufficient documentation

## 2023-03-25 LAB — ALT: ALT: 21 U/L (ref 0–44)

## 2023-03-25 LAB — AST: AST: 17 U/L (ref 15–41)

## 2023-03-25 NOTE — Telephone Encounter (Signed)
What labs would you like for her to have done and I will get her scheduled for lab and follow up with you.

## 2023-03-26 LAB — CERULOPLASMIN: Ceruloplasmin: 52.7 mg/dL — ABNORMAL HIGH (ref 19.0–39.0)

## 2023-03-27 LAB — MISC LABCORP TEST (SEND OUT): Labcorp test code: 3343

## 2023-04-02 ENCOUNTER — Ambulatory Visit (INDEPENDENT_AMBULATORY_CARE_PROVIDER_SITE_OTHER): Payer: 59 | Admitting: Clinical

## 2023-04-02 DIAGNOSIS — F39 Unspecified mood [affective] disorder: Secondary | ICD-10-CM

## 2023-04-02 DIAGNOSIS — F603 Borderline personality disorder: Secondary | ICD-10-CM

## 2023-04-02 DIAGNOSIS — F424 Excoriation (skin-picking) disorder: Secondary | ICD-10-CM

## 2023-04-03 NOTE — Telephone Encounter (Signed)
Yes diagnostic that would be great! Thanks so much.   Durenda Hurt, NP 04/03/2023 12:18 PM

## 2023-04-06 ENCOUNTER — Other Ambulatory Visit: Payer: Self-pay

## 2023-04-06 ENCOUNTER — Other Ambulatory Visit (HOSPITAL_COMMUNITY): Payer: Self-pay | Admitting: Oncology

## 2023-04-06 DIAGNOSIS — N63 Unspecified lump in unspecified breast: Secondary | ICD-10-CM

## 2023-04-06 DIAGNOSIS — N6322 Unspecified lump in the left breast, upper inner quadrant: Secondary | ICD-10-CM

## 2023-04-06 NOTE — BH Specialist Note (Signed)
Pt did not arrive to video visit and did not answer the phone; Left HIPPA-compliant message to call back Aryahna Spagna from [facility] at  [phone] (Ilir Mahrt's office).  ?; left MyChart message for patient.  ? ?

## 2023-04-08 DIAGNOSIS — L700 Acne vulgaris: Secondary | ICD-10-CM | POA: Diagnosis not present

## 2023-04-08 DIAGNOSIS — K130 Diseases of lips: Secondary | ICD-10-CM | POA: Diagnosis not present

## 2023-04-08 DIAGNOSIS — L853 Xerosis cutis: Secondary | ICD-10-CM | POA: Diagnosis not present

## 2023-04-09 ENCOUNTER — Ambulatory Visit (HOSPITAL_COMMUNITY): Payer: 59

## 2023-04-09 ENCOUNTER — Encounter (HOSPITAL_COMMUNITY): Payer: 59

## 2023-04-15 ENCOUNTER — Ambulatory Visit: Payer: 59 | Admitting: Orthopaedic Surgery

## 2023-04-16 ENCOUNTER — Ambulatory Visit: Payer: 59 | Admitting: Clinical

## 2023-04-16 DIAGNOSIS — Z91199 Patient's noncompliance with other medical treatment and regimen due to unspecified reason: Secondary | ICD-10-CM

## 2023-04-17 ENCOUNTER — Inpatient Hospital Stay: Payer: 59 | Attending: Hematology | Admitting: Oncology

## 2023-04-17 DIAGNOSIS — R790 Abnormal level of blood mineral: Secondary | ICD-10-CM | POA: Diagnosis not present

## 2023-04-17 DIAGNOSIS — D509 Iron deficiency anemia, unspecified: Secondary | ICD-10-CM | POA: Diagnosis not present

## 2023-04-17 NOTE — Progress Notes (Unsigned)
Virtual Visit via Telephone Note  I connected with Jacqueline Alvarado on 04/17/23 at  2:00 PM EDT by telephone and verified that I am speaking with the correct person using two identifiers.  Location: Patient: Home  Provider: Clinic    I discussed the limitations, risks, security and privacy concerns of performing an evaluation and management service by telephone and the availability of in person appointments. I also discussed with the patient that there may be a patient responsible charge related to this service. The patient expressed understanding and agreed to proceed.  History of Present Illness: Jacqueline Alvarado is a 27 year old female with past medical history significant for iron deficiency anemia secondary to heavy menstrual cycles which improved once she was started on birth control and occasional GI bleeding d/t hemorrhoids.  She received 1 dose of INFeD on 01/19/2023 with great tolerance.  Energy levels improved briefly but have slowly started to trend down.  She presents today for virtual visit to review most recent labs and discuss eleavted copper levels.   During workup for iron deficiency, she was noted to have a elevated copper level.  Additional lab work was ordered to rule out Jacqueline Alvarado which included serum ceruloplasmin, ALT/AST and urine copper levels.  Of note, patient reports since she was little having copper rings in her eyes.  Reports she has had intermittently elevated LFTs and had an ultrasound of her abdomen on 09/06/2021 which showed fatty liver.  Denies any abdominal pain.  She reports frequent HS abscess most recently being a fairly large area on the right side of her vulva that opened up on it's own with foul smelling drainage. Treated with antibiotics. . States there are several other areas in her groin and arm pits that have been there for months.  Reports feeling tired all the time, increased urinary frequency, headaches, cough, nausea and vomiting.  Reports her appetite  is low.  Reports increased anxiety and depression.  She also reported a left breast lump and tenderness.  Reports mom was diagnosed with breast cancer at age 53.  Had a mammogram on 10/23/2022 which showed no evidence of breast malignancy.  She is scheduled for a diagnostic mammogram on 04/28/2023.   Observations/Objective: Review of Systems  Constitutional:  Positive for malaise/fatigue. Negative for fever.  Respiratory:  Positive for cough.   Cardiovascular:  Positive for chest pain.  Gastrointestinal:  Positive for nausea and vomiting. Negative for abdominal pain.  Genitourinary:  Positive for frequency.  Musculoskeletal:  Positive for myalgias (Due to Accutane).  Skin:  Positive for rash.  Neurological:  Positive for weakness and headaches. Negative for dizziness.  Psychiatric/Behavioral:  Positive for depression. The patient is nervous/anxious.    Physical Exam Neurological:     Mental Status: She is alert and oriented to person, place, and time.    Assessment and Plan: 1. Iron deficiency anemia, unspecified iron deficiency anemia type -Received 1 g INFeD on 01/19/2023. -Labs from 03/12/2023 show hemoglobin of 11.6, MCV 72.5, ferritin 224 with an iron saturation of 21%.  TIBC 399. -Will recheck iron levels 4 months from previous.  2. High serum copper level -Copper level from 12/19/2022 was 237 and repeated on 03/12/2023 which was 243. -She had normal LFTs on 03/25/2023. -Serum ceruloplasmin 52.7 (19-39).  -Urine copper level was 19 and copper creatinine ratio was 7.  These are both normal. 24 urine cupper level was not collected. -We discussed additional labs including serum copper levels, uric acid, phosphorus, urinalysis (r/o hematuria) direct antiglobulin  test and zinc level while on supplements.  -Recommend starting zinc 50 mg 3 times daily and recheck labs on 04/28/23 with follow/up on 05/07/23.  -We discussed diagnosis for Jacqueline Alvarado typically would reveal low serum  ceruloplasmin level less than 200 mg/L, 24-hour urine copper excretion greater than 250 mcg, low serum couple levels, abnormal liver function test noting AST is typically higher than ALT, low serum uric acid and phosphorus levels.  Abnormal urinalysis typically with blood present.  Taking.  Coombs negative hemolytic anemia due to sudden release of copper and molecular genetic testing for mutations ATP 7B gene.  -Recommend having these completed at [facility] as I am unable to find specific labs in our [facility] health system.  We will review labs on 05/07/2023 already scheduled visit.   Normal Adults Andrey Campanile Alvarado ?  Serum ceruloplasmin (mg/L) 200-350 0-200  Serum copper (?g/L) [phone] 190-640  (?mol/L) 11-24 3-10  Basal 24-hr urinary copper (?g/day) <40 40-10,000  (?mol/day) <=0.6 >0.6  Liver copper (?g/g dry weight) 20-50 >250 (possibly >70)    Zinc Initial: 50 mg elemental zinc three times daily (adults)  24-hr urinary copper: <75 ?g (1.2 ?mol)/day as target Serum zinc level Serum AST and ALT levels   Maintenance: Titrate the dose based on efficacy monitoring data  Estimated nonceruloplasmin-bound copper <100 ?/L    Follow Up Instructions: Complete [facility] labs between now and 04/28/2023.  RTC as scheduled on 05/07/2023 virtually to discuss results. Start zinc 50 mg 3 times daily.   I discussed the assessment and treatment plan with the patient. The patient was provided an opportunity to ask questions and all were answered. The patient agreed with the plan and demonstrated an understanding of the instructions.   The patient was advised to call back or seek an in-person evaluation if the symptoms worsen or if the condition fails to improve as anticipated.  I provided 30 minutes of non-face-to-face time during this encounter.   Mauro Kaufmann, NP

## 2023-04-20 ENCOUNTER — Encounter: Payer: Self-pay | Admitting: Hematology

## 2023-04-21 ENCOUNTER — Ambulatory Visit: Payer: 59 | Admitting: Orthopaedic Surgery

## 2023-04-21 NOTE — BH Specialist Note (Signed)
Integrated Behavioral Health via Telemedicine Visit  04/28/2023 Jacqueline Alvarado [id number]  Number of Integrated Behavioral Health Clinician visits: Additional Visit  Session Start time: 7829   Session End time: 1020  Total time in minutes: 28   Referring Provider: Cyril Mourning, NP Patient/Family location: Home [facility] Provider location: [facility]  All persons participating in visit: [name] and [facility] Joyleen Haselton   Types of Service: Individual psychotherapy and Video visit  I connected with Jacqueline Alvarado's  n/a  via  Telephone or Video Enabled Telemedicine Application  (Video is Caregility application) and verified that I am speaking with the correct person using two identifiers. Discussed confidentiality: Yes   I discussed the limitations of telemedicine and the availability of in person appointments.  Discussed there is a possibility of technology failure and discussed alternative modes of communication if that failure occurs.  I discussed that engaging in this telemedicine visit, they consent to the provision of behavioral healthcare and the services will be billed under their insurance.  Patient and/or legal guardian expressed understanding and consented to Telemedicine visit: Yes   Presenting Concerns: Patient and/or family reports the following symptoms/concerns: Grieving loss of friend; unable to attend upcoming service due to work; processing work stress as well as progress towards personal goals (taking class to increase job opportunities, getting preventive health checks, started growing muscadine grapes, decreased skin-picking; implementing steps to prevent gambling behaviors). Duration of problem: Ongoing; Severity of problem: moderate  Patient and/or Family's Strengths/Protective Factors: Social connections, Concrete supports in place (healthy food, safe environments, etc.), Sense  of purpose, and Physical Health (exercise, healthy diet, medication compliance, etc.)  Goals Addressed: Patient will:  Reduce symptoms of: anxiety, compulsions, depression, and stress   Increase knowledge and/or ability of: stress reduction   Demonstrate ability to: Increase healthy adjustment to current life circumstances and Increase motivation to adhere to plan of care  Progress towards Goals: Ongoing  Interventions: Interventions utilized:  Motivational Interviewing, Link to [employer], and Supportive Reflection Standardized Assessments completed: Not Needed  Patient and/or Family Response: Patient agrees with treatment plan.   Assessment: Patient currently experiencing Borderline personality disorder; Excoriation disorder; Mood disorder, unspecified.   Patient may benefit from continued therapeutic intervention  .  Plan: Follow up with behavioral health clinician on : Two weeks Behavioral recommendations:  -Continue plan to apply for ongoing DBT Therapy at [facility] -Continue using daily self-coping strategies (journal, DBT workbook, time with niece/boyfriend on weekend off work, Nurse, mental health in [employer], using [employer], growing grapes, decreased gambling strategy, etc) -Continue plan to attend all upcoming medical appointments; advocating for appropriate healthcare, including first mammogram today -Allow time and space to grieve loss of friend; plan time with his family outside of service on day(s) off work Referral(s): Integrated [employer] (In Clinic)  I discussed the assessment and treatment plan with the patient and/or parent/guardian. They were provided an opportunity to ask questions and all were answered. They agreed with the plan and demonstrated an understanding of the instructions.   They were advised to call back or seek an in-person evaluation if the symptoms worsen or if the condition fails to improve as anticipated.  Rae Lips, LCSW     09/05/2022    9:38 AM 05/12/2022    2:46 PM 11/07/2020    1:32 PM 05/24/2019    3:52 PM 05/24/2019    3:51 PM  Depression  screen PHQ 2/9  Decreased Interest 2 3 0 2 2  Down, Depressed, Hopeless 3 3 1 2 2   PHQ - 2 Score 5 6 1 4 4   Altered sleeping 3 3 3 3    Tired, decreased energy 3 2 3 2    Change in appetite 2 2 3 3    Feeling bad or failure about yourself  3 3 1 2    Trouble concentrating 3 1 2 1    Moving slowly or fidgety/restless 0 1 1 2    Suicidal thoughts 1 0 1 0   PHQ-9 Score 20 18 15 17        09/05/2022    9:48 AM 05/12/2022    2:46 PM 11/07/2020    1:32 PM  GAD 7 : Generalized Anxiety Score  Nervous, Anxious, on Edge 3 3 3   Control/stop worrying 3 3 3   Worry too much - different things 3 3 3   Trouble relaxing 3 3 3   Restless 0 1 1  Easily annoyed or irritable 3 1 2   Afraid - awful might happen 3 1 1   Total GAD 7 Score 18 15 16

## 2023-04-28 ENCOUNTER — Ambulatory Visit: Payer: 59 | Admitting: Clinical

## 2023-04-28 ENCOUNTER — Encounter (HOSPITAL_COMMUNITY): Payer: Self-pay

## 2023-04-28 ENCOUNTER — Inpatient Hospital Stay: Payer: 59

## 2023-04-28 ENCOUNTER — Ambulatory Visit (HOSPITAL_COMMUNITY)
Admission: RE | Admit: 2023-04-28 | Discharge: 2023-04-28 | Disposition: A | Discharge status: Home / Self Care | Payer: 59 | Source: Ambulatory Visit | Attending: Oncology | Admitting: Oncology

## 2023-04-28 ENCOUNTER — Ambulatory Visit: Payer: 59 | Admitting: Orthopaedic Surgery

## 2023-04-28 DIAGNOSIS — F4321 Adjustment disorder with depressed mood: Secondary | ICD-10-CM

## 2023-04-28 DIAGNOSIS — F39 Unspecified mood [affective] disorder: Secondary | ICD-10-CM

## 2023-04-28 DIAGNOSIS — N6322 Unspecified lump in the left breast, upper inner quadrant: Secondary | ICD-10-CM | POA: Diagnosis not present

## 2023-04-28 DIAGNOSIS — R928 Other abnormal and inconclusive findings on diagnostic imaging of breast: Secondary | ICD-10-CM | POA: Diagnosis not present

## 2023-04-28 DIAGNOSIS — N63 Unspecified lump in unspecified breast: Secondary | ICD-10-CM | POA: Insufficient documentation

## 2023-04-28 DIAGNOSIS — Z803 Family history of malignant neoplasm of breast: Secondary | ICD-10-CM | POA: Diagnosis not present

## 2023-04-28 DIAGNOSIS — F603 Borderline personality disorder: Secondary | ICD-10-CM

## 2023-04-28 DIAGNOSIS — F424 Excoriation (skin-picking) disorder: Secondary | ICD-10-CM

## 2023-04-29 DIAGNOSIS — R790 Abnormal level of blood mineral: Secondary | ICD-10-CM | POA: Diagnosis not present

## 2023-05-07 ENCOUNTER — Inpatient Hospital Stay (HOSPITAL_BASED_OUTPATIENT_CLINIC_OR_DEPARTMENT_OTHER): Payer: 59 | Admitting: Oncology

## 2023-05-07 DIAGNOSIS — R790 Abnormal level of blood mineral: Secondary | ICD-10-CM

## 2023-05-07 NOTE — Progress Notes (Signed)
Re: waiting on [facility] labs  Called and spoke with patient.  Had labs performed at [facility] on 04/27/2023.  Still awaiting results.  Patient okay with deferring virtual visit today.   Has questions regarding why she was denied diagnostic mammogram of her left breast.  Has family history of breast cancer (mom diagnosed at age 29).  Targeted ultrasound did not reveal any abnormalities.  Will discuss with Dr. Ellin Saba.   She is currently taking zinc 50 mg daily.   Durenda Hurt, NP 05/07/2023 8:22 AM

## 2023-05-07 NOTE — BH Specialist Note (Signed)
Integrated Behavioral Health via Telemedicine Visit  05/07/2023 Jacqueline Alvarado [id number]  Number of Integrated Behavioral Health Clinician visits: Additional Visit  Session Start time: 4098   Session End time: 1020  Total time in minutes: 28   Referring Provider: *** Patient/Family location: [facility] Provider location: *** All persons participating in visit: *** Types of Service: {CHL AMB TYPE OF SERVICE:[phone]}  I connected with Jacqueline Alvarado and/or Jacqueline Alvarado's {family members:20773} via  Telephone or Video Enabled Telemedicine Application  (Video is Caregility application) and verified that I am speaking with the correct person using two identifiers. Discussed confidentiality: {YES/NO:21197}  I discussed the limitations of telemedicine and the availability of in person appointments.  Discussed there is a possibility of technology failure and discussed alternative modes of communication if that failure occurs.  I discussed that engaging in this telemedicine visit, they consent to the provision of behavioral healthcare and the services will be billed under their insurance.  Patient and/or legal guardian expressed understanding and consented to Telemedicine visit: {YES/NO:21197}  Presenting Concerns: Patient and/or family reports the following symptoms/concerns: *** Duration of problem: ***; Severity of problem: {Mild/Moderate/Severe:20260}  Patient and/or Family's Strengths/Protective Factors: {CHL AMB BH PROTECTIVE FACTORS:[phone]}  Goals Addressed: Patient will:  Reduce symptoms of: {IBH Symptoms:21014056}   Increase knowledge and/or ability of: {IBH Patient Tools:21014057}   Demonstrate ability to: {IBH Goals:21014053}  Progress towards Goals: {CHL AMB BH PROGRESS TOWARDS [phone]}  Interventions: Interventions utilized:  {IBH Interventions:21014054} Standardized Assessments completed: {IBH Screening Tools:21014051}  Patient and/or Family  Response: ***  Assessment: Patient currently experiencing ***.   Patient may benefit from ***.  Plan: Follow up with behavioral health clinician on : *** Behavioral recommendations: *** Referral(s): {IBH Referrals:21014055}  I discussed the assessment and treatment plan with the patient and/or parent/guardian. They were provided an opportunity to ask questions and all were answered. They agreed with the plan and demonstrated an understanding of the instructions.   They were advised to call back or seek an in-person evaluation if the symptoms worsen or if the condition fails to improve as anticipated.  Valetta Close Idris Edmundson, LCSW

## 2023-05-08 ENCOUNTER — Encounter: Payer: Self-pay | Admitting: Oncology

## 2023-05-13 ENCOUNTER — Encounter: Payer: Self-pay | Admitting: Oncology

## 2023-05-13 DIAGNOSIS — L700 Acne vulgaris: Secondary | ICD-10-CM | POA: Diagnosis not present

## 2023-05-13 DIAGNOSIS — L853 Xerosis cutis: Secondary | ICD-10-CM | POA: Diagnosis not present

## 2023-05-13 DIAGNOSIS — K130 Diseases of lips: Secondary | ICD-10-CM | POA: Diagnosis not present

## 2023-05-14 ENCOUNTER — Encounter: Payer: Self-pay | Admitting: Oncology

## 2023-05-15 ENCOUNTER — Other Ambulatory Visit: Payer: Self-pay | Admitting: Medical Genetics

## 2023-05-15 DIAGNOSIS — Z006 Encounter for examination for normal comparison and control in clinical research program: Secondary | ICD-10-CM

## 2023-05-20 ENCOUNTER — Other Ambulatory Visit (HOSPITAL_COMMUNITY): Payer: 59

## 2023-05-21 ENCOUNTER — Other Ambulatory Visit (HOSPITAL_COMMUNITY)
Admission: RE | Admit: 2023-05-21 | Discharge: 2023-05-21 | Disposition: A | Discharge status: Home / Self Care | Payer: 59 | Source: Ambulatory Visit | Attending: Oncology | Admitting: Oncology

## 2023-05-21 ENCOUNTER — Ambulatory Visit: Payer: 59 | Admitting: Clinical

## 2023-05-21 DIAGNOSIS — Z91199 Patient's noncompliance with other medical treatment and regimen due to unspecified reason: Secondary | ICD-10-CM

## 2023-05-21 DIAGNOSIS — Z006 Encounter for examination for normal comparison and control in clinical research program: Secondary | ICD-10-CM | POA: Insufficient documentation

## 2023-05-22 ENCOUNTER — Encounter: Payer: Self-pay | Admitting: Oncology

## 2023-05-29 LAB — GENECONNECT MOLECULAR SCREEN

## 2023-05-29 LAB — HELIX MOLECULAR SCREEN: Genetic Analysis Overall Interpretation: NEGATIVE

## 2023-06-01 ENCOUNTER — Other Ambulatory Visit: Payer: Self-pay | Admitting: Adult Health

## 2023-06-01 DIAGNOSIS — Z803 Family history of malignant neoplasm of breast: Secondary | ICD-10-CM

## 2023-06-01 NOTE — Progress Notes (Signed)
Refer to genetics

## 2023-06-01 NOTE — Telephone Encounter (Signed)
Spoke with patient and she states she would like to xfer the location of

## 2023-06-09 ENCOUNTER — Inpatient Hospital Stay: Payer: 59

## 2023-06-17 DIAGNOSIS — L700 Acne vulgaris: Secondary | ICD-10-CM | POA: Diagnosis not present

## 2023-06-19 ENCOUNTER — Ambulatory Visit: Payer: 59 | Admitting: Family Medicine

## 2023-06-19 ENCOUNTER — Inpatient Hospital Stay: Payer: 59 | Admitting: Oncology

## 2023-06-25 ENCOUNTER — Inpatient Hospital Stay: Payer: 59 | Admitting: Licensed Clinical Social Worker

## 2023-06-29 NOTE — Addendum Note (Signed)
Addended by: Geradine Girt B on: 06/29/2023 01:10 PM   Modules accepted: Orders

## 2023-07-01 ENCOUNTER — Inpatient Hospital Stay: Payer: 59

## 2023-07-02 ENCOUNTER — Inpatient Hospital Stay: Payer: 59 | Attending: Hematology

## 2023-07-02 DIAGNOSIS — R790 Abnormal level of blood mineral: Secondary | ICD-10-CM | POA: Insufficient documentation

## 2023-07-02 DIAGNOSIS — D509 Iron deficiency anemia, unspecified: Secondary | ICD-10-CM | POA: Diagnosis not present

## 2023-07-02 LAB — DIRECT ANTIGLOBULIN TEST (NOT AT ARMC)
DAT, IgG: NEGATIVE
DAT, complement: NEGATIVE

## 2023-07-02 LAB — URIC ACID: Uric Acid, Serum: 4.8 mg/dL (ref 2.5–7.1)

## 2023-07-03 ENCOUNTER — Encounter: Payer: Self-pay | Admitting: Oncology

## 2023-07-03 LAB — COPPER, SERUM: Copper: 230 ug/dL — ABNORMAL HIGH (ref 80–158)

## 2023-07-16 NOTE — Progress Notes (Signed)
 Virtual Visit via Telephone Note  I connected with Jacqueline Alvarado on 07/16/23 at 11:30 AM EST by telephone and verified that I am speaking with the correct person using two identifiers.  Location: Patient: Home  Provider: Clinic    I discussed the limitations, risks, security and privacy concerns of performing an evaluation and management service by telephone and the availability of in person appointments. I also discussed with the patient that there may be a patient responsible charge related to this service. The patient expressed understanding and agreed to proceed.   History of Present Illness: Patient is a 28 year old female with past medical history significant for iron  deficiency anemia secondary to heavy menstrual cycles which improved when she was started on birth control.  She has occasional GI bleeding due to hemorrhoids.  She received 1 dose of INFeD  on 01/19/2023. Energy levels improved briefly but slowly started to trend back down.  Incidentally, during workup for anemia was found to have an elevated copper  level.  Workup included negative UA for hematuria, normal serum copper  levels, uric acid and phosphorus levels.  She had normal LFTs.  Serum ceruloplasmin was elevated at 52.7.  She was started on zinc  50 mg 3 times daily.  Reports she was only taking zinc  10 mg twice daily.  Patient had genetic testing completed on 05/21/2023 given family history of breast cancer per mom at a young age.  Genetic analysis overall was negative.  Patient had a a diagnostic mammogram on 04/28/2023 for reported left breast lump and tenderness.  Ultrasound did not reveal any evidence of abnormality.  BI-RADS Category 1 negative.  Having some abdominal pain that is radiating to back. Worsening over the past 2 months. Occasiaonl N/V. Vomiting 4 times that's week. Feels like she may be pregnant but has had 7  negative pregnancy tests. Having some RUQ swelling and discomfort.  Denies any dysuria, hematuria or  increased urinary frequency.  Denies history of similar complaints prior to starting her menstrual cycle.  She has not had a cycle in approximately 6 weeks.  Reports she was told she had elevated liver enzymes in the past and is concerned about her liver given current elevated copper  levels.  Would like to have imaging of her liver.  Observations/Objective: Review of Systems  Constitutional:  Positive for malaise/fatigue.  Respiratory:  Negative for cough and wheezing.   Gastrointestinal:  Positive for abdominal pain, nausea and vomiting. Negative for blood in stool, constipation and diarrhea.  Musculoskeletal:  Positive for joint pain.  Neurological:  Negative for dizziness, sensory change and weakness.  Psychiatric/Behavioral:  Negative for depression. The patient is nervous/anxious.     Physical Exam Neurological:     Mental Status: She is alert and oriented to person, place, and time.    Assessment and Plan: 1. High serum copper  level (Primary) -Workup for Wilson's disease was essentially negative.  UA was negative for hematuria.  Uric acid and direct antiglobulin test were all WNL.  Phosphorus level slightly low at 2.5.  Ceruloplasmin level elevated at 52.7.  Serum zinc  level was normal at 62.  LFTs were WNL. -She is only taking zinc  10 mg 2 times daily. -Again we discussed that Wilson's disease typically would reveal low serum ceruloplasmin levels less than 200 mg/L, abnormal liver function test noting AST is typically higher than ALT, low serum uric acid and phosphorus levels.  Abnormal urinalysis typically with blood present.  Coombs testing was negative as well as molecular genetic testing for mutations ATP  7B gene. -Discussed increasing OTC zinc  50 mg 3 times daily.  -Recheck labs in 3 months.   2. Iron  deficiency anemia, unspecified iron  deficiency anemia type -Received 1 g INFeD  on 01/19/2023. -Labs from 03/12/23 show hemoglobin of 11.6, MCV 72.5, ferritin 224 with an iron   saturation of 21%.  TIBC 399. -We do not have any more up-to-date labs.  Repeat those today.  Will call with results.   3. Mass of upper inner quadrant of left breast -Has strong family history of breast cancer (mom at age 19). -Recently had genetic testing (helix 1) which was negative.   4.  Abdominal pain -This is a new complaint. -Had CT abdominal pelvis without contrast on 01/22/2022 for flank pain, periumbilical abdominal pain and nausea.  Findings revealed small epigastric ventral hernia without evidence of urolithiasis, hydronephrosis or other acute findings. -Status post cholecystectomy. -Reports symptoms feel similar to prior to her gallbladder being removed. -Discussed abdominal ultrasound and will call with results.  Follow Up Instructions: Discussed complete ultrasound of her abdomen.  Will call with results. Increase zinc  to 50 mg 3 times per day. Repeat iron  levels today along with a CBC. Recheck labs in approximately 3 months.   I discussed the assessment and treatment plan with the patient. The patient was provided an opportunity to ask questions and all were answered. The patient agreed with the plan and demonstrated an understanding of the instructions.   The patient was advised to call back or seek an in-person evaluation if the symptoms worsen or if the condition fails to improve as anticipated.  I provided 22 minutes of non-face-to-face time during this encounter.   Delon FORBES Hope, NP

## 2023-07-17 ENCOUNTER — Inpatient Hospital Stay: Payer: 59 | Attending: Hematology | Admitting: Oncology

## 2023-07-17 DIAGNOSIS — Z79899 Other long term (current) drug therapy: Secondary | ICD-10-CM | POA: Insufficient documentation

## 2023-07-17 DIAGNOSIS — Z803 Family history of malignant neoplasm of breast: Secondary | ICD-10-CM

## 2023-07-17 DIAGNOSIS — R1011 Right upper quadrant pain: Secondary | ICD-10-CM

## 2023-07-17 DIAGNOSIS — N6322 Unspecified lump in the left breast, upper inner quadrant: Secondary | ICD-10-CM | POA: Diagnosis not present

## 2023-07-17 DIAGNOSIS — R790 Abnormal level of blood mineral: Secondary | ICD-10-CM

## 2023-07-17 DIAGNOSIS — D509 Iron deficiency anemia, unspecified: Secondary | ICD-10-CM

## 2023-07-20 ENCOUNTER — Other Ambulatory Visit: Payer: Self-pay

## 2023-07-20 DIAGNOSIS — N6322 Unspecified lump in the left breast, upper inner quadrant: Secondary | ICD-10-CM

## 2023-07-20 DIAGNOSIS — D509 Iron deficiency anemia, unspecified: Secondary | ICD-10-CM

## 2023-07-20 NOTE — Progress Notes (Signed)
 Lab orders entered

## 2023-07-21 ENCOUNTER — Ambulatory Visit (HOSPITAL_COMMUNITY)
Admission: RE | Admit: 2023-07-21 | Discharge: 2023-07-21 | Disposition: A | Discharge status: Home / Self Care | Payer: 59 | Source: Ambulatory Visit | Attending: Oncology | Admitting: Oncology

## 2023-07-21 ENCOUNTER — Inpatient Hospital Stay: Payer: 59

## 2023-07-21 DIAGNOSIS — Z79899 Other long term (current) drug therapy: Secondary | ICD-10-CM | POA: Diagnosis not present

## 2023-07-21 DIAGNOSIS — D509 Iron deficiency anemia, unspecified: Secondary | ICD-10-CM

## 2023-07-21 DIAGNOSIS — R1011 Right upper quadrant pain: Secondary | ICD-10-CM | POA: Diagnosis not present

## 2023-07-21 DIAGNOSIS — K838 Other specified diseases of biliary tract: Secondary | ICD-10-CM | POA: Diagnosis not present

## 2023-07-21 DIAGNOSIS — Z9049 Acquired absence of other specified parts of digestive tract: Secondary | ICD-10-CM | POA: Diagnosis not present

## 2023-07-21 DIAGNOSIS — N6322 Unspecified lump in the left breast, upper inner quadrant: Secondary | ICD-10-CM

## 2023-07-21 LAB — IRON AND TIBC
Iron: 56 ug/dL (ref 28–170)
Saturation Ratios: 13 % (ref 10.4–31.8)
TIBC: 421 ug/dL (ref 250–450)
UIBC: 365 ug/dL

## 2023-07-21 LAB — COMPREHENSIVE METABOLIC PANEL
ALT: 16 U/L (ref 0–44)
AST: 18 U/L (ref 15–41)
Albumin: 3.6 g/dL (ref 3.5–5.0)
Alkaline Phosphatase: 28 U/L — ABNORMAL LOW (ref 38–126)
Anion gap: 7 (ref 5–15)
BUN: 11 mg/dL (ref 6–20)
CO2: 22 mmol/L (ref 22–32)
Calcium: 8.7 mg/dL — ABNORMAL LOW (ref 8.9–10.3)
Chloride: 106 mmol/L (ref 98–111)
Creatinine, Ser: 0.66 mg/dL (ref 0.44–1.00)
GFR, Estimated: >60 mL/min (ref >60)
Glucose, Bld: 91 mg/dL (ref 70–99)
Potassium: 3.6 mmol/L (ref 3.5–5.1)
Sodium: 135 mmol/L (ref 135–145)
Total Bilirubin: 0.4 mg/dL (ref 0.0–1.2)
Total Protein: 7.4 g/dL (ref 6.5–8.1)

## 2023-07-21 LAB — CBC WITH DIFFERENTIAL/PLATELET
Abs Immature Granulocytes: 0.02 10*3/uL (ref 0.00–0.07)
Basophils Absolute: 0 10*3/uL (ref 0.0–0.1)
Basophils Relative: 0 %
Eosinophils Absolute: 0.2 10*3/uL (ref 0.0–0.5)
Eosinophils Relative: 2 %
HCT: 40 % (ref 36.0–46.0)
Hemoglobin: 12.5 g/dL (ref 12.0–15.0)
Immature Granulocytes: 0 %
Lymphocytes Relative: 25 %
Lymphs Abs: 2.9 10*3/uL (ref 0.7–4.0)
MCH: 22.4 pg — ABNORMAL LOW (ref 26.0–34.0)
MCHC: 31.3 g/dL (ref 30.0–36.0)
MCV: 71.7 fL — ABNORMAL LOW (ref 80.0–100.0)
Monocytes Absolute: 0.8 10*3/uL (ref 0.1–1.0)
Monocytes Relative: 7 %
Neutro Abs: 7.3 10*3/uL (ref 1.7–7.7)
Neutrophils Relative %: 66 %
Platelets: 353 10*3/uL (ref 150–400)
RBC: 5.58 MIL/uL — ABNORMAL HIGH (ref 3.87–5.11)
RDW: 15.5 % (ref 11.5–15.5)
WBC: 11.3 10*3/uL — ABNORMAL HIGH (ref 4.0–10.5)
nRBC: 0 % (ref 0.0–0.2)

## 2023-07-21 LAB — FERRITIN: Ferritin: 127 ng/mL (ref 11–307)

## 2023-07-21 LAB — VITAMIN B12: Vitamin B-12: 300 pg/mL (ref 180–914)

## 2023-07-22 ENCOUNTER — Encounter: Payer: Self-pay | Admitting: Oncology

## 2023-07-23 ENCOUNTER — Inpatient Hospital Stay (HOSPITAL_BASED_OUTPATIENT_CLINIC_OR_DEPARTMENT_OTHER): Payer: 59 | Admitting: Genetic Counselor

## 2023-07-23 ENCOUNTER — Encounter: Payer: Self-pay | Admitting: Genetic Counselor

## 2023-07-23 DIAGNOSIS — Z8489 Family history of other specified conditions: Secondary | ICD-10-CM

## 2023-07-23 DIAGNOSIS — Z8041 Family history of malignant neoplasm of ovary: Secondary | ICD-10-CM

## 2023-07-23 DIAGNOSIS — Z808 Family history of malignant neoplasm of other organs or systems: Secondary | ICD-10-CM

## 2023-07-23 NOTE — Progress Notes (Addendum)
 REFERRING PROVIDER: Signa Delon LABOR, NP [address] [place],  [place] [address]  PRIMARY PROVIDER:  [facility], [facility]  PRIMARY REASON FOR VISIT:  1. Family history of ovarian cancer   2. Family history of melanoma   3. Family history of neoplasm of breast      HISTORY OF PRESENT ILLNESS:  I connected with  Jacqueline Alvarado on 07/23/2023 at 10:00 AM EDT by MyChart video conference and verified that I am speaking with the correct person using two identifiers.   Patient location: Work Provider location: [facility]    Jacqueline Alvarado, a 28 y.o. female, was seen for a [facility] cancer genetics consultation at the request of Dr. Signa due to a family history of breast, ovarian and pancreatic cancer and a known BRCA1 mutation.  Ms. Lockyer presents to clinic today to discuss the possibility of a hereditary predisposition to cancer, genetic testing, and to further clarify her future cancer risks, as well as potential cancer risks for family members.   Jacqueline Alvarado is a 28 y.o. female with no personal history of cancer.    CANCER HISTORY:  Oncology History   No history exists.    Past Medical History:  Diagnosis Date   Abdominal pain, chronic, right upper quadrant    Abnormal Pap smear of cervix    Anemia    iron  deficiency   Anxiety    BPD (bronchopulmonary dysplasia)    Depression    Family history of breast cancer    Family history of melanoma    Family history of neoplasm of breast 05/12/2022   Family history of ovarian cancer    Headache(784.0)    Hypertension    Papanicolaou smear of cervix with positive high risk human papilloma virus (HPV) test 05/31/2019   05/2019 repeat HPV in 1 year    PONV (postoperative nausea and vomiting)    Hx: of nausea only at age 54    Past Surgical History:  Procedure Laterality Date   CHOLECYSTECTOMY N/A 02/02/2017   Procedure: LAPAROSCOPIC CHOLECYSTECTOMY;  Surgeon: Mavis Anes, MD;  Location: [facility];  Service: General;   Laterality: N/A;   COLONOSCOPY N/A 01/07/2016   Dr. harvey: Nonbleeding internal hemorrhoids, large. 3 bands successfully placed. Distal ileum containing multiple ulcers which she felt was related to NSAIDs. Biopsies nonspecific.   HEMORRHOID BANDING N/A 01/07/2016   Procedure: HEMORRHOID BANDING;  Surgeon: Margo LITTIE Harvey, MD;  Location: [facility];  Service: Endoscopy;  Laterality: N/A;   MASS EXCISION N/A 11/26/2016   Procedure: EXCISION OF ABDOMINAL CICATRIX 4CM;  Surgeon: Mavis Anes, MD;  Location: [facility];  Service: General;  Laterality: N/A;  p knows to arrive at 7:50   NASAL SEPTOPLASTY W/ TURBINOPLASTY Bilateral 06/24/2013   Procedure: TURBINATE REDUCTION;  Surgeon: Marlyce Finer, MD;  Location: [facility] OR;  Service: ENT;  Laterality: Bilateral;   TONSILLECTOMY AND ADENOIDECTOMY Bilateral 06/24/2013   Procedure: TONSILLECTOMY ;  Surgeon: Marlyce Finer, MD;  Location: [facility] OR;  Service: ENT;  Laterality: Bilateral;   TUMOR EXCISION     Hx: of right side of neck   WISDOM TOOTH EXTRACTION      Social History   Socioeconomic History   Marital status: Single    Spouse name: Not on file   Number of children: Not on file   Years of education: Not on file   Highest education level: Not on file  Occupational History   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  Tobacco Use  Smoking status: Former    Current packs/day: 0.00    Types: Cigarettes    Start date: 10/23/2015    Quit date: 10/22/2016    Years since quitting: 6.7   Smokeless tobacco: Never   Tobacco comments:    smoked only 1-2 cig weekly for 1 year  Vaping Use   Vaping status: Some Days  Substance and Sexual Activity   Alcohol use: Yes    Alcohol/week: 0.0 standard drinks of alcohol    Comment: occasional   Drug use: Yes    Frequency: 7.0 times per week    Types: Marijuana    Comment: 3x/week   Sexual activity: Yes    Birth control/protection: Condom  Other Topics Concern   Not on file  Social History  Narrative   Not on file   Social Drivers of Health   Financial Resource Strain: High Risk (05/12/2022)   Overall Financial Resource Strain (CARDIA)    Difficulty of Paying Living Expenses: Hard  Food Insecurity: Food Insecurity Present (05/12/2022)   Hunger Vital Sign    Worried About Running Out of Food in the Last Year: Never true    Ran Out of Food in the Last Year: Sometimes true  Transportation Needs: Unmet Transportation Needs (05/12/2022)   PRAPARE - Administrator, Civil Service (Medical): Yes    Lack of Transportation (Non-Medical): Yes  Physical Activity: Insufficiently Active (05/12/2022)   Exercise Vital Sign    Days of Exercise per Week: 1 day    Minutes of Exercise per Session: 30 min  Stress: Stress Concern Present (05/12/2022)   [employer] of Occupational Health - Occupational Stress Questionnaire    Feeling of Stress : Very much  Social Connections: Moderately Integrated (05/12/2022)   Social Connection and Isolation Panel [NHANES]    Frequency of Communication with Friends and Family: More than three times a week    Frequency of Social Gatherings with Friends and Family: Once a week    Attends Religious Services: 1 to 4 times per year    Active Member of [employer] or Organizations: Yes    Attends Banker Meetings: 1 to 4 times per year    Marital Status: Never married     FAMILY HISTORY:  We obtained a detailed, 4-generation family history.  Significant diagnoses are listed below: Family History  Problem Relation Age of Onset   Arthritis Mother    Breast cancer Mother 108       second dx age 96;   BRCA 1/2 Mother        BRCA1 pos   Hypertension Father    COPD Father    Melanoma Father        X2   Breast cancer Maternal Aunt 68   Breast cancer Maternal Aunt 40   Ovarian cancer Maternal Aunt 48   Stroke Maternal Uncle 9   Breast cancer Maternal Grandmother        dx. 60s   Stroke Paternal Grandmother    Stroke Paternal  Grandfather    Cancer - Cervical Other    Diabetes Other    Stroke Other      The patient does not have children.  She has a full sister who had negative genetic testing for BRCA1 through [employer]  The patient's parents are living.  The patient's mother had breast cancer at 44 and 53 and had tested positive for a hereditary BRCA1 mutation through [employer]  The patient's mother has three maternal  half sisters and three maternal half brothers.  Two sisters had breast cancer, one died at 47 and the second had it 47 and then ovarian cancer at 65.  The maternal grandmother had breast cancer in her 66's and has a brother with breast cancer.  The patient's father had melanoma on his foot and leg.  There is no other family history of cancer reported.  Jacqueline Alvarado is aware of previous family history of genetic testing for hereditary cancer risks. Patient's maternal ancestors are of African American descent, and paternal ancestors are of African American descent. There is no reported Ashkenazi Jewish ancestry. There is no known consanguinity.  GENETIC COUNSELING ASSESSMENT: Jacqueline Alvarado is a 28 y.o. female with a family history of cancer which is somewhat suggestive of a hereditary cancer syndrome and predisposition to cancer given her BRCA1 mutation in the family. We, therefore, discussed and recommended the following at today's visit.   DISCUSSION: We discussed that, in general, most cancer is not inherited in families, but instead is sporadic or familial. Sporadic cancers occur by chance and typically happen at older ages (>50 years) as this type of cancer is caused by genetic changes acquired during an individual's lifetime. Some families have more cancers than would be expected by chance; however, the ages or types of cancer are not consistent with a known genetic mutation or known genetic mutations have been ruled out. This type of familial cancer is thought to be due to a combination of  multiple genetic, environmental, hormonal, and lifestyle factors. While this combination of factors likely increases the risk of cancer, the exact source of this risk is not currently identifiable or testable.  We discussed that 5 - 10% of cancer is hereditary, with most cases breast cancer associated with BRCA mutations. The patient's mother had genetic testing and was found to have a BRCA1 [id number] pathogenic mutation.  This can increase the risk for several cancers including breast, ovarian, pancreatic, and prostate cancer.  There is a slight risk for melanoma.  There are other genes that can be associated with hereditary cancer syndromes.  The patient's father had two melanomas.  He is of African American descent.  The most common hereditary melanoma condition is associated with CDKN2A mutations, but there are also other genes associated with hereditary melanoma syndromes.  We discussed that testing is beneficial for several reasons including knowing how to follow individuals after completing their treatment, identifying whether potential treatment options such as PARP inhibitors would be beneficial, and understand if other family members could be at risk for cancer and allow them to undergo genetic testing.   We reviewed the characteristics, features and inheritance patterns of hereditary cancer syndromes. We also discussed genetic testing, including the appropriate family members to test, the process of testing, insurance coverage and turn-around-time for results. We discussed the implications of a negative, positive, carrier and/or variant of uncertain significant result. Jacqueline Alvarado  was offered a common hereditary cancer panel (36+ genes) and an expanded pan-cancer panel (70+ genes). Jacqueline Alvarado was informed of the benefits and limitations of each panel, including that expanded pan-cancer panels contain genes that do not have clear management guidelines at this point in time.  We also discussed  that as the number of genes included on a panel increases, the chances of variants of uncertain significance increases. Jacqueline Alvarado decided to pursue genetic testing for the CancerNext-Expanded+RNAinsight gene panel.   The CancerNext-Expanded gene panel offered by [facility] and includes sequencing, rearrangement, and RNA  analysis for the following 76 genes: AIP, ALK, APC, ATM, AXIN2, BAP1, BARD1, BMPR1A, BRCA1, BRCA2, BRIP1, CDC73, CDH1, CDK4, CDKN1B, CDKN2A, CEBPA, CHEK2, CTNNA1, DDX41, DICER1, ETV6, FH, FLCN, GATA2, LZTR1, MAX, MBD4, MEN1, MET, MLH1, MSH2, MSH3, MSH6, MUTYH, NF1, NF2, NTHL1, PALB2, PHOX2B, PMS2, POT1, PRKAR1A, PTCH1, PTEN, RAD51C, RAD51D, RB1, RET, RUNX1, SDHA, SDHAF2, SDHB, SDHC, SDHD, SMAD4, SMARCA4, SMARCB1, SMARCE1, STK11, SUFU, TMEM127, TP53, TSC1, TSC2, VHL, and WT1 (sequencing and deletion/duplication); EGFR, HOXB13, KIT, MITF, PDGFRA, POLD1, and POLE (sequencing only); EPCAM and GREM1 (deletion/duplication only).    Based on Jacqueline Alvarado's family history of cancer, she meets medical criteria for genetic testing. Despite that she meets criteria, she may still have an out of pocket cost. We discussed that if her out of pocket cost for testing is over $100, the laboratory will call and confirm whether she wants to proceed with testing.  If the out of pocket cost of testing is less than $100 she will be billed by the genetic testing laboratory.   We discussed that some people do not want to undergo genetic testing due to fear of genetic discrimination.  The Genetic Information Nondiscrimination Act (GINA) was signed into federal law in 2008. GINA prohibits health insurers and most employers from discriminating against individuals based on genetic information (including the results of genetic tests and family history information). According to GINA, health insurance companies cannot consider genetic information to be a preexisting condition, nor can they use it to make decisions  regarding coverage or rates. GINA also makes it illegal for most employers to use genetic information in making decisions about hiring, firing, promotion, or terms of employment. It is important to note that GINA does not offer protections for life insurance, disability insurance, or long-term care insurance. GINA does not apply to those in the [employer], those who work for companies with less than 15 employees, and new life insurance or long-term disability insurance policies.  Health status due to a cancer diagnosis is not protected under GINA. More information about GINA can be found by visiting eliteclients.be.  Jacqueline Alvarado participated in the [facility] program and had negative testing.  GeneConnect is a fish farm manager offered by [employer].  This program provides sequencing of a patient's exome, while providing genetic test results back for Hereditary Breast and Ovarian Cancer Syndrome (BRCA1 and BRCA2), Lynch syndrome (MLH1, MSH2, EPCAM, MSH6 and PMS2), and Familial Hypercholesterolemia (LDLR, APOB, PCSK9, and LDLRAP1). We discussed that families with a strong history of cancer, may not get enough testing through the [facility] program, but that families without a strong history or no history, may benefit from this program.  Participating with [facility] is free to the individual and will allow them to obtain genetic testing in the future at a lower cost for other conditions by uncovering genes tested through the exome testing.  This program is available in addition to any genetic testing that may be performed through the [facility].  Based on the negative results of this program, we suspect that Jacqueline Alvarado is negative for the familial BRCA1 mutation.      PLAN: After considering the risks, benefits, and limitations, Jacqueline Alvarado provided informed consent to pursue genetic testing.  An appointment will be made to draw blood, which will be sent to [facility] for analysis of the CancerNext-Expanded+RNAinsight. Results should be available within approximately 2-3 weeks' time, at which point they will be disclosed by telephone to Jacqueline Alvarado, as will any additional  recommendations warranted by these results. Jacqueline Alvarado will receive a summary of her genetic counseling visit and a copy of her results once available. This information will also be available in Epic.   Lastly, we encouraged Jacqueline Alvarado to remain in contact with cancer genetics annually so that we can continuously update the family history and inform her of any changes in cancer genetics and testing that may be of benefit for this family.   Jacqueline Alvarado questions were answered to her satisfaction today. Our contact information was provided should additional questions or concerns arise. Thank you for the referral and allowing us  to share in the care of your patient.   Jacqueline Walmer P. Perri, MS, CGC Licensed, Patent Attorney [email or web address] phone: [phone]  67 minutes were spent on the date of the encounter in service to the patient including preparation, face-to-face consultation, documentation and care coordination.  The patient was seen alone.  Drs. Lanny Stalls, and/or Gudena were available for questions, if needed..    _______________________________________________________________________ For Office Staff:  Number of people involved in session: 1 Was an Intern/ student involved with case: no

## 2023-07-29 ENCOUNTER — Inpatient Hospital Stay: Payer: 59

## 2023-08-03 ENCOUNTER — Telehealth: Payer: Self-pay | Admitting: Genetic Counselor

## 2023-08-03 NOTE — Telephone Encounter (Signed)
Left VM asking patient to call the [facility] to set up a blood draw appointment for testing.  I also left my number in case she wanted to set that up through myself or cancel the order.

## 2023-08-17 ENCOUNTER — Encounter: Payer: Self-pay | Admitting: Family Medicine

## 2023-08-17 ENCOUNTER — Encounter: Payer: Self-pay | Admitting: Hematology

## 2023-08-17 ENCOUNTER — Ambulatory Visit (INDEPENDENT_AMBULATORY_CARE_PROVIDER_SITE_OTHER): Payer: 59 | Admitting: Family Medicine

## 2023-08-17 VITALS — BP 127/83 | HR 72 | Ht 68.0 in | Wt 357.0 lb

## 2023-08-17 DIAGNOSIS — G43009 Migraine without aura, not intractable, without status migrainosus: Secondary | ICD-10-CM

## 2023-08-17 DIAGNOSIS — N921 Excessive and frequent menstruation with irregular cycle: Secondary | ICD-10-CM

## 2023-08-17 DIAGNOSIS — Z23 Encounter for immunization: Secondary | ICD-10-CM

## 2023-08-17 DIAGNOSIS — M25561 Pain in right knee: Secondary | ICD-10-CM

## 2023-08-17 DIAGNOSIS — R7301 Impaired fasting glucose: Secondary | ICD-10-CM

## 2023-08-17 DIAGNOSIS — E7849 Other hyperlipidemia: Secondary | ICD-10-CM

## 2023-08-17 DIAGNOSIS — E559 Vitamin D deficiency, unspecified: Secondary | ICD-10-CM

## 2023-08-17 DIAGNOSIS — E038 Other specified hypothyroidism: Secondary | ICD-10-CM

## 2023-08-17 DIAGNOSIS — G8929 Other chronic pain: Secondary | ICD-10-CM | POA: Insufficient documentation

## 2023-08-17 DIAGNOSIS — G43909 Migraine, unspecified, not intractable, without status migrainosus: Secondary | ICD-10-CM | POA: Insufficient documentation

## 2023-08-17 DIAGNOSIS — R519 Headache, unspecified: Secondary | ICD-10-CM | POA: Insufficient documentation

## 2023-08-17 NOTE — Patient Instructions (Addendum)
I appreciate the opportunity to provide care to you today!    Follow up:  4 months  Labs: please stop by the lab during the week to get your blood drawn ( TSH, Lipid profile, HgA1c, Vit D)  Screening: HIV and Hep C  Migraine Prevention and Management  Lifestyle Changes: -Decrease or Avoid Caffeine and Alcohol: Reducing intake can help minimize migraine triggers. Eat and Sleep on a Regular Schedule: Consistency in meal and sleep times can reduce the frequency of migraines. Exercise Several Times per Week: Regular physical activity can help reduce migraine frequency and severity. Supplements to Prevent Migraines: Riboflavin (Vitamin B2): Take 400 mg daily to help prevent migraines.  Migraine Rescue: Will consider starting you on sumatriptan once your insurance coverage is confirmed. Sumatriptan: Take 25 mg at the onset of a migraine with a glass of liquid. Wait at least 2 hours between doses. The maximum dose is 200 mg within a 24-hour period.  For optimal results with weight loss, I recommend:  Decreasing portion sizes. Reducing sugar, sodium, and carbohydrate intake, and limiting saturated fats in your diet. Increasing your fiber intake by incorporating more whole grains, fruits, and vegetables. Setting healthy goals and focusing on lowering carbs, sugar, and fat. Increasing the variety of fruits and vegetables in your diet. Reducing soda consumption and limiting processed foods. In addition to taking your weight loss medication, engage in moderate-intensity physical activity for at least 150 minutes per week for the best results.   Please continue to follow up with the orthopedic surgeon and the gynecologist.     Attached with your AVS, you will find valuable resources for self-education. I highly recommend dedicating some time to thoroughly examine them.   Please continue to a heart-healthy diet and increase your physical activities. Try to exercise for at least five  days a week.    It was a pleasure to see you and I look forward to continuing to work together on your health and well-being. Please do not hesitate to call the office if you need care or have questions about your care.  In case of emergency, please visit the Emergency Department for urgent care, or contact our clinic at [phone] to schedule an appointment. We're here to help you!   Have a wonderful day and week. With Gratitude, Gilmore Laroche MSN, FNP-BC

## 2023-08-17 NOTE — Assessment & Plan Note (Signed)
The patient is encouraged to follow up with orthopedic surgery scheduled

## 2023-08-17 NOTE — Assessment & Plan Note (Signed)
 Patient educated on CDC recommendation for the vaccine. Verbal consent was obtained from the patient, vaccine administered by nurse, no sign of adverse reactions noted at this time. Patient education on arm soreness and use of tylenol or ibuprofen for this patient  was discussed. Patient educated on the signs and symptoms of adverse effect and advise to contact the office if they occur.

## 2023-08-17 NOTE — Assessment & Plan Note (Signed)
The patient is currently on a combined oral contraceptive and reports that her last menstrual cycle started on January 4 and ended on August 15, 2023. She states that her flow was regular, with no abdominal pain or blood clots reported.  She denies symptoms of dizziness, lightheadedness, or fatigue. Her recent iron panel was stable, with stable hemoglobin and hematocrit levels.  The patient is encouraged to follow up with her gynecologist, Dr. Cyril Mourning. She reports that she will call to schedule an appointment.

## 2023-08-17 NOTE — Assessment & Plan Note (Signed)
  The patient would like to discuss weight loss options today. She reports minimal physical activity due to chronic right knee pain but has been attempting to implement a heart-healthy diet.  I reviewed my weight management plan with the patient, encouraging her to reduce portion sizes, increase protein and fiber intake, and decrease consumption of saturated and trans fats, carbohydrates, sugar, and sodium. She is also encouraged to increase physical activity to at least 150 minutes of moderate-intensity exercise per week.  I recommended low-impact exercise, such as swimming or cycling that she can participate in, and the patient verbalized understanding.  The patient is currently self-pay and does not have insurance. However, I discussed both oral and injectable weight loss treatment options with her. She reports that she will call her insurance provider to verify coverage for [facility]. Wt Readings from Last 3 Encounters:  08/17/23 (!) 357 lb 0.6 oz (162 kg)  03/12/23 (!) 325 lb 1.6 oz (147.5 kg)  03/06/23 (!) 331 lb 12.8 oz (150.5 kg)
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
## 2023-08-17 NOTE — Assessment & Plan Note (Signed)
The patient reports experiencing 5 to 6 migraine headaches per month. She recently had a headache that lasted about five days, accompanied by photosensitivity and nausea. She has been taking over-the-counter excedrin migraine medication and reports some relief. Today, she rates her headache as 2/10, which she attributes to lack of sleep due to working extra shifts. The patient is currently self-pay and prefers to defer initiation of prescription therapy until she has insurance coverage. She is encouraged to take over-the-counter vitamin B2 (400 mg daily) for prophylactic migraine prevention. No red flag symptoms reported. Preventive measures for migraines were discussed with the patient, including:  Decreasing or avoiding caffeine and alcohol to minimize migraine triggers. Maintaining a regular eating and sleeping schedule to help reduce migraine frequency. Engaging in regular physical activity several times per week to help decrease migraine frequency and severity. The patient verbalized understanding.

## 2023-08-19 ENCOUNTER — Telehealth: Payer: Self-pay | Admitting: Genetic Counselor

## 2023-08-19 NOTE — Telephone Encounter (Signed)
 LM that she has not had her blood drawn for genetic testing.  I called to ask if she wanted to come in for testing.  Left CB instructions.

## 2023-08-20 ENCOUNTER — Encounter: Payer: Self-pay | Admitting: Genetic Counselor

## 2023-08-21 ENCOUNTER — Other Ambulatory Visit: Payer: Self-pay | Admitting: Adult Health

## 2023-09-04 ENCOUNTER — Encounter: Payer: Self-pay | Admitting: Hematology

## 2023-09-22 ENCOUNTER — Telehealth: Payer: Self-pay | Admitting: Orthopaedic Surgery

## 2023-09-22 NOTE — Telephone Encounter (Signed)
 Dr. Sanjuan Dame pt - returned a call to the pt, lvm for her to call me back.

## 2023-09-23 ENCOUNTER — Encounter: Payer: Self-pay | Admitting: Orthopaedic Surgery

## 2023-09-23 ENCOUNTER — Ambulatory Visit: Admitting: Orthopaedic Surgery

## 2023-09-23 VITALS — BP 165/131 | HR 82 | Ht 68.5 in | Wt 348.0 lb

## 2023-09-23 DIAGNOSIS — G8929 Other chronic pain: Secondary | ICD-10-CM | POA: Diagnosis not present

## 2023-09-23 DIAGNOSIS — M25561 Pain in right knee: Secondary | ICD-10-CM

## 2023-09-23 DIAGNOSIS — M5441 Lumbago with sciatica, right side: Secondary | ICD-10-CM

## 2023-09-23 DIAGNOSIS — Z6841 Body Mass Index (BMI) 40.0 and over, adult: Secondary | ICD-10-CM

## 2023-09-23 MED ORDER — TRAMADOL HCL 50 MG PO TABS
50.0000 mg | ORAL_TABLET | Freq: Four times a day (QID) | ORAL | 3 refills | Status: AC | PRN
Start: 2023-09-23 — End: 2023-10-23

## 2023-09-23 NOTE — Progress Notes (Signed)
 My knee hurts again.  She has had more pain with the right knee.  She has no giving way but medial pain.  She has no giving way.  She has no trauma.  She had epidural but her back pain has returned.  I told her to call Dr. Alvester Morin again.  Right knee has effusion, crepitus, ROM 0 to 110, no limp, stable knee, no distal distal edema.  Encounter Diagnoses  Name Primary?   Chronic pain of right knee Yes   Chronic bilateral low back pain with right-sided sciatica    Body mass index 50.0-59.9, adult (HCC)    Morbid obesity (HCC)    PROCEDURE NOTE:  The patient requests injections of the right knee , verbal consent was obtained.  The right knee was prepped appropriately after time out was performed.   Sterile technique was observed and injection of 1 cc of DepoMedrol 40mg  with several cc's of plain xylocaine. Anesthesia was provided by ethyl chloride and a 20-gauge needle was used to inject the knee area. The injection was tolerated well.  A band aid dressing was applied.  The patient was advised to apply ice later today and tomorrow to the injection sight as needed.  Return prn.  Call if any problem.  Precautions discussed.  Electronically Signed Darreld Mclean, MD 3/12/202510:35 AM

## 2023-09-24 ENCOUNTER — Ambulatory Visit: Admitting: Adult Health

## 2023-10-06 ENCOUNTER — Other Ambulatory Visit: Payer: Self-pay

## 2023-10-06 DIAGNOSIS — D509 Iron deficiency anemia, unspecified: Secondary | ICD-10-CM

## 2023-10-06 DIAGNOSIS — Z8489 Family history of other specified conditions: Secondary | ICD-10-CM

## 2023-10-06 DIAGNOSIS — R718 Other abnormality of red blood cells: Secondary | ICD-10-CM

## 2023-10-06 DIAGNOSIS — Z8041 Family history of malignant neoplasm of ovary: Secondary | ICD-10-CM

## 2023-10-06 DIAGNOSIS — Z803 Family history of malignant neoplasm of breast: Secondary | ICD-10-CM

## 2023-10-06 DIAGNOSIS — N6322 Unspecified lump in the left breast, upper inner quadrant: Secondary | ICD-10-CM

## 2023-10-06 DIAGNOSIS — Z808 Family history of malignant neoplasm of other organs or systems: Secondary | ICD-10-CM

## 2023-10-07 ENCOUNTER — Inpatient Hospital Stay: Payer: 59 | Attending: Hematology

## 2023-10-07 ENCOUNTER — Other Ambulatory Visit: Payer: Self-pay | Admitting: Oncology

## 2023-10-07 DIAGNOSIS — R790 Abnormal level of blood mineral: Secondary | ICD-10-CM | POA: Diagnosis not present

## 2023-10-07 DIAGNOSIS — Z803 Family history of malignant neoplasm of breast: Secondary | ICD-10-CM

## 2023-10-07 DIAGNOSIS — N6322 Unspecified lump in the left breast, upper inner quadrant: Secondary | ICD-10-CM

## 2023-10-07 DIAGNOSIS — Z8489 Family history of other specified conditions: Secondary | ICD-10-CM

## 2023-10-07 DIAGNOSIS — D509 Iron deficiency anemia, unspecified: Secondary | ICD-10-CM

## 2023-10-07 DIAGNOSIS — Z8041 Family history of malignant neoplasm of ovary: Secondary | ICD-10-CM

## 2023-10-07 DIAGNOSIS — Z808 Family history of malignant neoplasm of other organs or systems: Secondary | ICD-10-CM

## 2023-10-07 DIAGNOSIS — R718 Other abnormality of red blood cells: Secondary | ICD-10-CM

## 2023-10-07 LAB — COMPREHENSIVE METABOLIC PANEL
ALT: 15 U/L (ref 0–44)
AST: 18 U/L (ref 15–41)
Albumin: 3.4 g/dL — ABNORMAL LOW (ref 3.5–5.0)
Alkaline Phosphatase: 26 U/L — ABNORMAL LOW (ref 38–126)
Anion gap: 10 (ref 5–15)
BUN: 14 mg/dL (ref 6–20)
CO2: 22 mmol/L (ref 22–32)
Calcium: 9.1 mg/dL (ref 8.9–10.3)
Chloride: 105 mmol/L (ref 98–111)
Creatinine, Ser: 0.71 mg/dL (ref 0.44–1.00)
GFR, Estimated: >60 mL/min (ref >60)
Glucose, Bld: 93 mg/dL (ref 70–99)
Potassium: 4.2 mmol/L (ref 3.5–5.1)
Sodium: 137 mmol/L (ref 135–145)
Total Bilirubin: 0.4 mg/dL (ref 0.0–1.2)
Total Protein: 7.3 g/dL (ref 6.5–8.1)

## 2023-10-07 LAB — CBC WITH DIFFERENTIAL/PLATELET
Abs Immature Granulocytes: 0.03 10*3/uL (ref 0.00–0.07)
Basophils Absolute: 0.1 10*3/uL (ref 0.0–0.1)
Basophils Relative: 1 %
Eosinophils Absolute: 0.2 10*3/uL (ref 0.0–0.5)
Eosinophils Relative: 2 %
HCT: 36.5 % (ref 36.0–46.0)
Hemoglobin: 11.2 g/dL — ABNORMAL LOW (ref 12.0–15.0)
Immature Granulocytes: 0 %
Lymphocytes Relative: 27 %
Lymphs Abs: 2.7 10*3/uL (ref 0.7–4.0)
MCH: 22.5 pg — ABNORMAL LOW (ref 26.0–34.0)
MCHC: 30.7 g/dL (ref 30.0–36.0)
MCV: 73.3 fL — ABNORMAL LOW (ref 80.0–100.0)
Monocytes Absolute: 0.7 10*3/uL (ref 0.1–1.0)
Monocytes Relative: 7 %
Neutro Abs: 6.5 10*3/uL (ref 1.7–7.7)
Neutrophils Relative %: 63 %
Platelets: 266 10*3/uL (ref 150–400)
RBC: 4.98 MIL/uL (ref 3.87–5.11)
RDW: 16 % — ABNORMAL HIGH (ref 11.5–15.5)
WBC: 10.2 10*3/uL (ref 4.0–10.5)
nRBC: 0 % (ref 0.0–0.2)

## 2023-10-07 LAB — IRON AND TIBC
Iron: 60 ug/dL (ref 28–170)
Saturation Ratios: 15 % (ref 10.4–31.8)
TIBC: 401 ug/dL (ref 250–450)
UIBC: 341 ug/dL

## 2023-10-07 LAB — VITAMIN B12: Vitamin B-12: 270 pg/mL (ref 180–914)

## 2023-10-07 LAB — GENETIC SCREENING ORDER

## 2023-10-07 LAB — FERRITIN: Ferritin: 115 ng/mL (ref 11–307)

## 2023-10-11 LAB — ZINC: Zinc: 58 ug/dL (ref 44–115)

## 2023-10-22 ENCOUNTER — Encounter: Payer: Self-pay | Admitting: Hematology

## 2023-10-22 NOTE — Progress Notes (Deleted)
 Virtual Visit via Telephone Note  I connected with Jacqueline Alvarado on 10/22/23 at  8:30 AM EDT by telephone and verified that I am speaking with the correct person using two identifiers.  Location: Patient: Home  Provider: Clinic    I discussed the limitations, risks, security and privacy concerns of performing an evaluation and management service by telephone and the availability of in person appointments. I also discussed with the patient that there may be a patient responsible charge related to this service. The patient expressed understanding and agreed to proceed.   History of Present Illness: Patient is a 28 year old female with past medical history significant for iron deficiency anemia secondary to heavy menstrual cycles which improved when she was started on birth control.  She has occasional GI bleeding due to hemorrhoids.  She received 1 dose of INFeD on 01/19/2023. Energy levels improved briefly but slowly started to trend back down.  Incidentally, during workup for anemia was found to have an elevated copper level.  Workup included negative UA for hematuria, normal serum copper levels, uric acid and phosphorus levels.  She had normal LFTs.  Serum ceruloplasmin was elevated at 52.7.  She was started on zinc 50 mg 3 times daily.  Reports she was only taking zinc 10 mg twice daily.  Patient had genetic testing completed on 05/21/2023 given family history of breast cancer per mom at a young age.  Genetic analysis overall was negative.  Patient had a a diagnostic mammogram on 04/28/2023 for reported left breast lump and tenderness.  Ultrasound did not reveal any evidence of abnormality.  BI-RADS Category 1 negative.  Having some abdominal pain that is radiating to back. Worsening over the past 2 months. Occasiaonl N/V. Vomiting 4 times that's week. Feels like she may be pregnant but has had 7  negative pregnancy tests. Having some RUQ swelling and discomfort.  Denies any dysuria, hematuria or  increased urinary frequency.  Denies history of similar complaints prior to starting her menstrual cycle.  She has not had a cycle in approximately 6 weeks.  Reports she was told she had elevated liver enzymes in the past and is concerned about her liver given current elevated copper levels.  Would like to have imaging of her liver.  Observations/Objective: Review of Systems  Constitutional:  Positive for malaise/fatigue.  Respiratory:  Negative for cough and wheezing.   Gastrointestinal:  Positive for abdominal pain, nausea and vomiting. Negative for blood in stool, constipation and diarrhea.  Musculoskeletal:  Positive for joint pain.  Neurological:  Negative for dizziness, sensory change and weakness.  Psychiatric/Behavioral:  Negative for depression. The patient is nervous/anxious.     Physical Exam Neurological:     Mental Status: She is alert and oriented to person, place, and time.     Assessment and Plan: 1. High serum copper level (Primary) -Workup for Wilson's disease was essentially negative.  UA was negative for hematuria.  Uric acid and direct antiglobulin test were all WNL.  Phosphorus level slightly low at 2.5.  Ceruloplasmin level elevated at 52.7.  Serum zinc level was normal at 62.  LFTs were WNL. -She is only taking zinc 10 mg 2 times daily. -Again we discussed that Wilson's disease typically would reveal low serum ceruloplasmin levels less than 200 mg/L, abnormal liver function test noting AST is typically higher than ALT, low serum uric acid and phosphorus levels.  Abnormal urinalysis typically with blood present.  Coombs testing was negative as well as molecular genetic testing for  mutations ATP 7B gene. -Discussed increasing OTC zinc 50 mg 3 times daily.  -Recheck labs in 3 months.   2. Iron deficiency anemia, unspecified iron deficiency anemia type -Received 1 g INFeD on 01/19/2023. -Labs from 03/12/23 show hemoglobin of 11.6, MCV 72.5, ferritin 224 with an iron  saturation of 21%.  TIBC 399. -We do not have any more up-to-date labs.  Repeat those today.  Will call with results.   3. Mass of upper inner quadrant of left breast -Has strong family history of breast cancer (mom at age 36). -Recently had genetic testing (helix 1) which was negative.   4.  Abdominal pain -This is a new complaint. -Had CT abdominal pelvis without contrast on 01/22/2022 for flank pain, periumbilical abdominal pain and nausea.  Findings revealed small epigastric ventral hernia without evidence of urolithiasis, hydronephrosis or other acute findings. -Status post cholecystectomy. -Reports symptoms feel similar to prior to her gallbladder being removed. -Discussed abdominal ultrasound and will call with results.  Follow Up Instructions: Discussed complete ultrasound of her abdomen.  Will call with results. Increase zinc to 50 mg 3 times per day. Repeat iron levels today along with a CBC. Recheck labs in approximately 3 months.   I discussed the assessment and treatment plan with the patient. The patient was provided an opportunity to ask questions and all were answered. The patient agreed with the plan and demonstrated an understanding of the instructions.   The patient was advised to call back or seek an in-person evaluation if the symptoms worsen or if the condition fails to improve as anticipated.  I provided 22 minutes of non-face-to-face time during this encounter.   Mauro Kaufmann, NP

## 2023-10-23 ENCOUNTER — Inpatient Hospital Stay: Payer: 59 | Attending: Hematology | Admitting: Oncology

## 2023-11-05 ENCOUNTER — Telehealth: Payer: Self-pay | Admitting: Genetic Counselor

## 2023-11-05 ENCOUNTER — Encounter: Payer: Self-pay | Admitting: Genetic Counselor

## 2023-11-05 DIAGNOSIS — Z1379 Encounter for other screening for genetic and chromosomal anomalies: Secondary | ICD-10-CM | POA: Insufficient documentation

## 2023-11-05 NOTE — Telephone Encounter (Signed)
 LM on VM that results are back and to please call.  Left CB instructions.

## 2023-11-09 ENCOUNTER — Encounter: Payer: Self-pay | Admitting: Hematology

## 2023-11-09 ENCOUNTER — Ambulatory Visit: Payer: Self-pay | Admitting: Genetic Counselor

## 2023-11-09 ENCOUNTER — Encounter: Payer: Self-pay | Admitting: Genetic Counselor

## 2023-11-09 DIAGNOSIS — Z1379 Encounter for other screening for genetic and chromosomal anomalies: Secondary | ICD-10-CM

## 2023-11-09 NOTE — Telephone Encounter (Signed)
 I sent a patient message to [name] based on notes I had made when I saw her in February.  Please see patient message to her.

## 2023-11-09 NOTE — Progress Notes (Signed)
 HPI:  Ms. 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 Please refer to our prior cancer genetics clinic note for more information regarding our discussion, assessment and recommendations, at the time. Ms. Jacqueline Alvarado recent genetic test results were disclosed to her, as were recommendations warranted by these results. These results and recommendations are discussed in more detail below.  CANCER HISTORY:  Oncology History   No history exists.    FAMILY HISTORY:  We obtained a detailed, 4-generation family history.  Significant diagnoses are listed below: Family History  Problem Relation Age of Onset   Arthritis Mother    Breast cancer Mother 20       second dx age 64;   BRCA 1/2 Mother        BRCA1 pos   Hypertension Father    COPD Father    Melanoma Father        X2   Breast cancer Maternal Aunt 31   Breast cancer Maternal Aunt 40   Ovarian cancer Maternal Aunt 48   Stroke Maternal Uncle 63   Breast cancer Maternal Grandmother        dx. 60s   Stroke Paternal Grandmother    Stroke Paternal Grandfather    Cancer - Cervical Other    Diabetes Other    Stroke Other        The patient does not have children.  She has a full sister who had negative genetic testing for BRCA1 through [employer] [employer]  The patient's parents are living.   The patient's mother had breast cancer at 32 and 77 and had tested positive for a hereditary BRCA1 mutation through [employer]  The patient's mother has three maternal half sisters and three maternal half brothers.  Two sisters had breast cancer, one died at 25 and the second had it 25 and then ovarian cancer at 29.  The maternal grandmother had breast cancer in her 41's and has a brother with breast cancer.   The patient's father had melanoma on his foot and leg.  There is no other family history of cancer reported.   Ms. Jacqueline Alvarado is aware of previous  family history of genetic testing for hereditary cancer risks. Patient's maternal ancestors are of African American descent, and paternal ancestors are of African American descent. There is no reported Ashkenazi Jewish ancestry. There is no known consanguinity.  GENETIC TEST RESULTS: Genetic testing reported out on November 05, 2023 through the CancerNext-Expanded+RNAinsight cancer panel found no pathogenic mutations. Ms. Jacqueline Alvarado test was normal and did not reveal the familial mutation. We call this result a true negative result because the cancer-causing mutation was identified in Ms. Jacqueline Alvarado's family, and she did not inherit it.  Given this negative result, Ms. Jacqueline Alvarado's chances of developing BRCA-related cancers are the same as they are in the general population.  The CancerNext-Expanded gene panel offered by [facility] and includes sequencing, rearrangement, and RNA analysis for the following 76 genes: AIP, ALK, APC, ATM, AXIN2, BAP1, BARD1, BMPR1A, BRCA1, BRCA2, BRIP1, CDC73, CDH1, CDK4, CDKN1B, CDKN2A, CEBPA, CHEK2, CTNNA1, DDX41, DICER1, ETV6, FH, FLCN, GATA2, LZTR1, MAX, MBD4, MEN1, MET, MLH1, MSH2, MSH3, MSH6, MUTYH, NF1, NF2, NTHL1, PALB2, PHOX2B, PMS2, POT1, PRKAR1A, PTCH1, PTEN, RAD51C, RAD51D, RB1, RET, RUNX1, SDHA, SDHAF2, SDHB, SDHC, SDHD, SMAD4, SMARCA4, SMARCB1, SMARCE1, STK11, SUFU, TMEM127, TP53, TSC1, TSC2, VHL, and WT1 (sequencing and deletion/duplication); EGFR, HOXB13, KIT, MITF, PDGFRA, POLD1, and POLE (  sequencing only); EPCAM and GREM1 (deletion/duplication only). The test report has been scanned into EPIC and is located under the Molecular Pathology section of the Results Review tab.  A portion of the result report is included below for reference.     We discussed with Ms. Jacqueline Alvarado that because current genetic testing is not perfect, it is possible there may be a gene mutation in one of these genes that current testing cannot detect, but that chance is small.  We also discussed, that  there could be another gene that has not yet been discovered, or that we have not yet tested, that is responsible for the cancer diagnoses in the family. It is also possible there is a hereditary cause for the cancer in the family that Ms. Jacqueline Alvarado did not inherit and therefore was not identified in her testing.  Therefore, it is important to remain in touch with cancer genetics in the future so that we can continue to offer Ms. Jacqueline Alvarado the most up to date genetic testing.   ADDITIONAL GENETIC TESTING: We discussed with Ms. Jacqueline Alvarado that her genetic testing was fairly extensive.  If there are genes identified to increase cancer risk that can be analyzed in the future, we would be happy to discuss and coordinate this testing at that time.    CANCER SCREENING RECOMMENDATIONS: Ms. Jacqueline Alvarado test result is considered negative (normal).  This is consistent with Ms. Jacqueline Alvarado's genetic testing through the [place] program which was also negative.   Therefore, it is recommended she continue to follow the cancer management and screening guidelines provided by her primary healthcare provider. An individual's cancer risk and medical management are not determined by genetic test results alone. Overall cancer risk assessment incorporates additional factors, including personal medical history, family history, and any available genetic information that may result in a personalized plan for cancer prevention and surveillance  An individual's cancer risk and medical management are not determined by genetic test results alone. Overall cancer risk assessment incorporates additional factors, including personal medical history, family history, and any available genetic information that may result in a personalized plan for cancer prevention and surveillance.  RECOMMENDATIONS FOR FAMILY MEMBERS:   Since she did not inherit a identifiable mutation in a cancer predisposition gene included on this panel, her children could not have  inherited a known mutation from her in one of these genes. Individuals in this family might be at some increased risk of developing cancer, over the general population risk, simply due to the family history of cancer.  We recommended women in this family have a yearly mammogram beginning at age 73, or 67 years younger than the earliest onset of cancer, an annual clinical breast exam, and perform monthly breast self-exams. Women in this family should also have a gynecological exam as recommended by their primary provider. All family members should be referred for colonoscopy starting at age 43, or 79 years younger than the earliest onset of cancer.  FOLLOW-UP: Lastly, we discussed with Ms. Jacqueline Alvarado that cancer genetics is a rapidly advancing field and it is possible that new genetic tests will be appropriate for her and/or her family members in the future. We encouraged her to remain in contact with cancer genetics on an annual basis so we can update her personal and family histories and let her know of advances in cancer genetics that may benefit this family.   Our contact number was provided. Ms. Jacqueline Alvarado questions were answered to her satisfaction, and she knows she is welcome  to call us  at anytime with additional questions or concerns.   Marijo Shove, MS, [facility] Licensed, Certified Genetic Counselor [email or web address]

## 2023-11-26 NOTE — Addendum Note (Signed)
 Addended by: CHART CORRECTION ANALYST SIX, IDENTITY on: 11/26/2023 01:58 PM   Modules accepted: Orders, Level of Service

## 2023-11-26 NOTE — Progress Notes (Signed)
 This encounter was created in error - please disregard.

## 2023-12-17 ENCOUNTER — Ambulatory Visit: Payer: Self-pay | Admitting: Family Medicine

## 2023-12-23 ENCOUNTER — Encounter: Payer: Self-pay | Admitting: Family Medicine

## 2024-02-25 ENCOUNTER — Other Ambulatory Visit: Payer: Self-pay | Admitting: *Deleted

## 2024-03-04 ENCOUNTER — Encounter: Payer: Self-pay | Admitting: Radiology

## 2024-05-16 ENCOUNTER — Encounter: Payer: Self-pay | Admitting: Radiology

## 2024-06-22 ENCOUNTER — Encounter: Payer: Self-pay | Admitting: Internal Medicine

## 2024-06-22 ENCOUNTER — Ambulatory Visit: Admitting: Internal Medicine

## 2024-06-22 VITALS — BP 131/88 | HR 60 | Ht 68.5 in | Wt 370.4 lb

## 2024-06-22 DIAGNOSIS — G43819 Other migraine, intractable, without status migrainosus: Secondary | ICD-10-CM

## 2024-06-22 DIAGNOSIS — N6322 Unspecified lump in the left breast, upper inner quadrant: Secondary | ICD-10-CM | POA: Diagnosis not present

## 2024-06-22 DIAGNOSIS — K219 Gastro-esophageal reflux disease without esophagitis: Secondary | ICD-10-CM | POA: Insufficient documentation

## 2024-06-22 DIAGNOSIS — R7879 Finding of abnormal level of heavy metals in blood: Secondary | ICD-10-CM | POA: Insufficient documentation

## 2024-06-22 DIAGNOSIS — K649 Unspecified hemorrhoids: Secondary | ICD-10-CM | POA: Diagnosis not present

## 2024-06-22 MED ORDER — RIZATRIPTAN BENZOATE 10 MG PO TABS: 10.0000 mg | ORAL_TABLET | ORAL | 2 refills | Status: AC | PRN

## 2024-06-22 MED ORDER — OMEPRAZOLE 40 MG PO CPDR: 40.0000 mg | DELAYED_RELEASE_CAPSULE | Freq: Every day | ORAL | 5 refills | Status: AC

## 2024-06-22 NOTE — Assessment & Plan Note (Signed)
 Epigastric pain likely due to GERD Started omeprazole 40 mg QD Advised to avoid hot and spicy food Has history of gallstones, s/p cholecystectomy - last US  of abdomen in 01/25 was unremarkable

## 2024-06-22 NOTE — Assessment & Plan Note (Addendum)
 Has history of intermittent rectal bleeding Advised to maintain adequate hydration to avoid constipation, although she reports a loose BM usually If recurrent rectal bleeding or discomfort, needs to contact GI for follow-up

## 2024-06-22 NOTE — Patient Instructions (Signed)
 Please take Maxalt as needed for migraine, maximum 2 tablets in a day.  Please take Omeprazole as prescribed for acid reflux.  Please maintain at least 64 ounces of fluid intake in a day.

## 2024-06-22 NOTE — Assessment & Plan Note (Signed)
 Reports history of left breast mass Had US  of breast in 2024, ordered diagnostic mammogram as follow-up

## 2024-06-22 NOTE — Progress Notes (Signed)
 Acute Office Visit  Subjective:    Patient ID: Jacqueline Alvarado, female    DOB: 04-Sep-1995, 28 y.o.   MRN: [id number]  Chief Complaint  Patient presents with   Migraine    Reports lump behind left ear, having migraines all her life have progressively worsened in the last 3 weeks.   Abdominal Pain    Reports side pain since she had her gallbladder removed in 2018. Concerns about this pain.     HPI Patient is in today for complaint of episodes of headache, associated with nausea and photophobia.  She has a history of migraine, but has tried taking Tylenol  and Excedrin Migraine without much relief.  She used to have only 1-2 episodes of migraine a month, but he has about 2-3 episodes in a week for the last 1 month.  Denies any visual disturbance at baseline.  Denies any numbness or tingling of the UE.  She also reports feeling a hard structure behind her left ear, which appears to be a temporal bone marking.  She has a scalp cyst in the occipital area, has been evaluated by dermatology for it.  She also reports epigastric and right upper quadrant pain, which is intermittent.  She has had cholecystectomy for gallstones in 2018.  She reports chronic loose BM, but denies any watery diarrhea, melena or hematochezia.  She also reports bilateral ear fullness and muffled sounds at times.  Denies any ear discharge, fever or chills.  Past Medical History:  Diagnosis Date   Abdominal pain, chronic, right upper quadrant    Abnormal Pap smear of cervix    Anemia    iron  deficiency   Anxiety    Depression    Family history of breast cancer    Family history of melanoma    Family history of neoplasm of breast 05/12/2022   Family history of ovarian cancer    Headache(784.0)    Hypertension    Papanicolaou smear of cervix with positive high risk human papilloma virus (HPV) test 05/31/2019   05/2019 repeat HPV in 1 year    PONV (postoperative nausea and vomiting)    Hx: of nausea only at age 37     Past Surgical History:  Procedure Laterality Date   CHOLECYSTECTOMY N/A 02/02/2017   Procedure: LAPAROSCOPIC CHOLECYSTECTOMY;  Surgeon: Mavis Anes, MD;  Location: [facility];  Service: General;  Laterality: N/A;   COLONOSCOPY N/A 01/07/2016   Dr. harvey: Nonbleeding internal hemorrhoids, large. 3 bands successfully placed. Distal ileum containing multiple ulcers which she felt was related to NSAIDs. Biopsies nonspecific.   HEMORRHOID BANDING N/A 01/07/2016   Procedure: HEMORRHOID BANDING;  Surgeon: Margo LITTIE Harvey, MD;  Location: [facility];  Service: Endoscopy;  Laterality: N/A;   MASS EXCISION N/A 11/26/2016   Procedure: EXCISION OF ABDOMINAL CICATRIX 4CM;  Surgeon: Mavis Anes, MD;  Location: [facility];  Service: General;  Laterality: N/A;  p knows to arrive at 7:50   NASAL SEPTOPLASTY W/ TURBINOPLASTY Bilateral 06/24/2013   Procedure: TURBINATE REDUCTION;  Surgeon: Marlyce Finer, MD;  Location: [facility] OR;  Service: ENT;  Laterality: Bilateral;   TONSILLECTOMY AND ADENOIDECTOMY Bilateral 06/24/2013   Procedure: TONSILLECTOMY ;  Surgeon: Marlyce Finer, MD;  Location: [facility] OR;  Service: ENT;  Laterality: Bilateral;   TUMOR EXCISION     Hx: of right side of neck   WISDOM TOOTH EXTRACTION      Family History  Problem Relation Age of Onset   Arthritis Mother    Breast  cancer Mother 44       second dx age 6;   BRCA 1/2 Mother        BRCA1 pos   Hypertension Father    COPD Father    Melanoma Father        X2   Breast cancer Maternal Aunt 60   Breast cancer Maternal Aunt 40   Ovarian cancer Maternal Aunt 48   Stroke Maternal Uncle 70   Breast cancer Maternal Grandmother        dx. 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  Tobacco Use   Smoking status: Former    Current packs/day: 0.00    Types: Cigarettes    Start date: 10/23/2015    Quit date: 10/22/2016    Years since quitting: 7.6   Smokeless tobacco: Never   Tobacco comments:    smoked only 1-2 cig weekly for 1 year  Vaping Use   Vaping status: Some Days   Substances: Nicotine, THC, Flavoring  Substance and Sexual Activity   Alcohol use: Yes    Alcohol/week: 0.0 standard drinks of alcohol    Comment: occasional   Drug use: Yes    Frequency: 7.0 times per week    Types: Marijuana    Comment: 3x/week   Sexual activity: Yes    Birth control/protection: Condom  Other Topics Concern   Not on file  Social History Narrative   Not on file   Social Drivers of Health   Financial Resource Strain: High Risk (05/12/2022)   Overall Financial Resource Strain (CARDIA)    Difficulty of Paying Living Expenses: Hard  Food Insecurity: Food Insecurity Present (05/12/2022)   Hunger Vital Sign    Worried About Running Out of Food in the Last Year: Never true    Ran Out of Food in the Last Year: Sometimes true  Transportation Needs: Unmet Transportation Needs (05/12/2022)   PRAPARE - Administrator, Civil Service (Medical): Yes    Lack of Transportation (Non-Medical): Yes  Physical Activity: Insufficiently Active (05/12/2022)   Exercise Vital Sign    Days of Exercise per Week: 1 day    Minutes of Exercise per Session: 30 min  Stress: Stress Concern Present (05/12/2022)   [employer] of Occupational Health - Occupational Stress Questionnaire    Feeling of Stress : Very much  Social Connections: Moderately Integrated (05/12/2022)   Social Connection and Isolation Panel    Frequency of Communication with Friends and Family: More than three times a week    Frequency of Social Gatherings with Friends and Family: Once a week     Attends Religious Services: 1 to 4 times per year    Active Member of [employer] or Organizations: Yes    Attends Banker Meetings: 1 to 4 times per year    Marital Status: Never married  Intimate Partner Violence: At Risk (05/12/2022)   Humiliation, Afraid, Rape, and Kick questionnaire    Fear of Current or Ex-Partner: No    Emotionally Abused: Yes  Physically Abused: No    Sexually Abused: No    Outpatient Medications Prior to Visit  Medication Sig Dispense Refill   ACCUTANE 40 MG capsule      cholecalciferol (VITAMIN D3) 25 MCG (1000 UNIT) tablet      HYDROcodone -acetaminophen  (NORCO/VICODIN) 5-325 MG tablet One tablet every six hours for pain.  Limit 7 days. 28 tablet 0   lisinopril  (ZESTRIL ) 20 MG tablet Take 1 tablet by mouth once daily 30 tablet 3   LORYNA 3-0.02 MG tablet Take 1 tablet by mouth daily.     sertraline  (ZOLOFT ) 25 MG tablet Take 3 tablets by mouth once daily 90 tablet 1   tretinoin (RETIN-A) 0.025 % cream SMARTSIG:sparingly Topical Daily PRN     No facility-administered medications prior to visit.    No Known Allergies  Review of Systems  Constitutional:  Negative for chills and fever.  HENT:  Negative for congestion and sore throat.   Eyes:  Negative for pain and discharge.  Respiratory:  Negative for cough and shortness of breath.   Cardiovascular:  Negative for chest pain and palpitations.  Gastrointestinal:  Positive for abdominal pain and nausea. Negative for constipation, diarrhea and vomiting.  Endocrine: Negative for polydipsia and polyuria.  Genitourinary:  Negative for dysuria and hematuria.  Musculoskeletal:  Negative for neck pain and neck stiffness.  Skin:  Negative for rash.  Neurological:  Positive for headaches. Negative for dizziness and weakness.  Psychiatric/Behavioral:  Negative for agitation and behavioral problems.        Objective:    Physical Exam Vitals reviewed.  Constitutional:      General: She is not in acute  distress.    Appearance: She is not diaphoretic.  HENT:     Head: Normocephalic and atraumatic.     Right Ear: There is impacted cerumen.     Left Ear: There is impacted cerumen.     Nose: Nose normal.     Mouth/Throat:     Mouth: Mucous membranes are moist.  Eyes:     General: No scleral icterus.    Extraocular Movements: Extraocular movements intact.     Pupils: Pupils are equal, round, and reactive to light.  Cardiovascular:     Rate and Rhythm: Normal rate and regular rhythm.     Heart sounds: Normal heart sounds. No murmur heard. Pulmonary:     Breath sounds: Normal breath sounds. No wheezing or rales.  Abdominal:     Palpations: Abdomen is soft.     Tenderness: There is abdominal tenderness (Mild, epigastric and RUQ).  Musculoskeletal:     Cervical back: Neck supple. No tenderness.     Right lower leg: No edema.     Left lower leg: No edema.  Skin:    General: Skin is warm.     Findings: No rash.  Neurological:     General: No focal deficit present.     Mental Status: She is alert and oriented to person, place, and time.  Psychiatric:        Mood and Affect: Mood normal.        Behavior: Behavior normal.     BP 131/88   Pulse 60   Ht 5' 8.5 (1.74 m)   Wt (!) 370 lb 6.4 oz (168 kg)   SpO2 100%   BMI 55.50 kg/m  Wt Readings from Last 3 Encounters:  06/22/24 (!) 370 lb 6.4 oz (168 kg)  09/23/23 (!) 348 lb (157.9 kg)  08/17/23 (!) 357 lb  0.6 oz (162 kg)        Assessment & Plan:   Problem List Items Addressed This Visit       Cardiovascular and Mediastinum   Migraines - Primary   Her headache description appears to be migraine Maxalt as needed for migraine If frequent migraine episodes, will consider prophylactic treatment      Relevant Medications   rizatriptan (MAXALT) 10 MG tablet     Digestive   Gastroesophageal reflux disease   Epigastric pain likely due to GERD Started omeprazole 40 mg QD Advised to avoid hot and spicy food Has history  of gallstones, s/p cholecystectomy - last US  of abdomen in 01/25 was unremarkable      Relevant Medications   omeprazole (PRILOSEC) 40 MG capsule     Other   Mass of upper inner quadrant of left breast   Reports history of left breast mass Had US  of breast in 2024, ordered diagnostic mammogram as follow-up      Relevant Orders   MM 3D DIAGNOSTIC MAMMOGRAM BILATERAL BREAST   High blood copper  level   She had concerns about high blood copper  level, was evaluated by hematology-ceruloplasmin level was elevated instead of being low Has been told to take zinc  supplement Needs to follow-up with hematology Check CMP      Relevant Orders   CMP14+EGFR     Meds ordered this encounter  Medications   rizatriptan (MAXALT) 10 MG tablet    Sig: Take 1 tablet (10 mg total) by mouth as needed for migraine. May repeat in 2 hours if needed    Dispense:  10 tablet    Refill:  2   omeprazole (PRILOSEC) 40 MG capsule    Sig: Take 1 capsule (40 mg total) by mouth daily.    Dispense:  30 capsule    Refill:  5     Charnese Federici MARLA Blanch, MD

## 2024-06-22 NOTE — Assessment & Plan Note (Signed)
 She had concerns about high blood copper  level, was evaluated by hematology-ceruloplasmin level was elevated instead of being low Has been told to take zinc  supplement Needs to follow-up with hematology Check CMP

## 2024-06-22 NOTE — Assessment & Plan Note (Signed)
 Her headache description appears to be migraine Maxalt as needed for migraine If frequent migraine episodes, will consider prophylactic treatment

## 2024-06-23 ENCOUNTER — Other Ambulatory Visit (HOSPITAL_COMMUNITY): Payer: Self-pay | Admitting: Internal Medicine

## 2024-06-23 ENCOUNTER — Ambulatory Visit: Payer: Self-pay | Admitting: Internal Medicine

## 2024-06-23 DIAGNOSIS — N63 Unspecified lump in unspecified breast: Secondary | ICD-10-CM

## 2024-06-23 LAB — CMP14+EGFR
ALT: 17 IU/L (ref 0–32)
AST: 18 IU/L (ref 0–40)
Albumin: 3.7 g/dL — ABNORMAL LOW (ref 4.0–5.0)
Alkaline Phosphatase: 55 IU/L (ref 41–116)
BUN/Creatinine Ratio: 10 (ref 9–23)
BUN: 8 mg/dL (ref 6–20)
Bilirubin Total: <0.2 mg/dL (ref 0.0–1.2)
CO2: 24 mmol/L (ref 20–29)
Calcium: 9.2 mg/dL (ref 8.7–10.2)
Chloride: 103 mmol/L (ref 96–106)
Creatinine, Ser: 0.78 mg/dL (ref 0.57–1.00)
Globulin, Total: 2.4 g/dL (ref 1.5–4.5)
Glucose: 88 mg/dL (ref 70–99)
Potassium: 4.3 mmol/L (ref 3.5–5.2)
Sodium: 139 mmol/L (ref 134–144)
Total Protein: 6.1 g/dL (ref 6.0–8.5)
eGFR: 105 mL/min/1.73 (ref >59)

## 2024-07-03 ENCOUNTER — Telehealth: Admitting: Physician Assistant

## 2024-07-03 DIAGNOSIS — J069 Acute upper respiratory infection, unspecified: Secondary | ICD-10-CM

## 2024-07-03 DIAGNOSIS — L299 Pruritus, unspecified: Secondary | ICD-10-CM

## 2024-07-03 NOTE — Progress Notes (Signed)
" °  Because I am worried about something more substantial going on as itching is not common along with the other symptoms, I feel your condition warrants further evaluation and I recommend that you be seen in a face-to-face visit.   NOTE: There will be NO CHARGE for this E-Visit   If you are having a true medical emergency, please call 911.     For an urgent face to face visit, [place] has multiple urgent care centers for your convenience.  Click the link below for the full list of locations and hours, walk-in wait times, appointment scheduling options and driving directions:  Urgent Care - [place], [place], [place], [place], [place], [place]       Your MyChart E-visit questionnaire answers were reviewed by a board certified advanced clinical practitioner to complete your personal care plan based on your specific symptoms.    Thank you for using e-Visits.    "

## 2024-07-03 NOTE — Progress Notes (Signed)
 Message sent to patient requesting further input regarding current symptoms. Awaiting patient response.

## 2024-07-30 ENCOUNTER — Encounter

## 2024-08-01 ENCOUNTER — Telehealth: Admitting: Family Medicine

## 2024-08-01 DIAGNOSIS — L02214 Cutaneous abscess of groin: Secondary | ICD-10-CM | POA: Diagnosis not present

## 2024-08-01 NOTE — Progress Notes (Signed)
 " Virtual Visit Consent   Jacqueline Alvarado, you are scheduled for a virtual visit with a [facility] provider today. Just as with appointments in the office, your consent must be obtained to participate. Your consent will be active for this visit and any virtual visit you may have with one of our providers in the next 365 days. If you have a MyChart account, a copy of this consent can be sent to you electronically.  As this is a virtual visit, video technology does not allow for your provider to perform a traditional examination. This may limit your provider's ability to fully assess your condition. If your provider identifies any concerns that need to be evaluated in person or the need to arrange testing (such as labs, EKG, etc.), we will make arrangements to do so. Although advances in technology are sophisticated, we cannot ensure that it will always work on either your end or our end. If the connection with a video visit is poor, the visit may have to be switched to a telephone visit. With either a video or telephone visit, we are not always able to ensure that we have a secure connection.  By engaging in this virtual visit, you consent to the provision of healthcare and authorize for your insurance to be billed (if applicable) for the services provided during this visit. Depending on your insurance coverage, you may receive a charge related to this service.  I need to obtain your verbal consent now. Are you willing to proceed with your visit today? Jacqueline Alvarado has provided verbal consent on 08/01/2024 for a virtual visit (video or telephone). Jacqueline Alvarado, [place]  Date: 08/01/2024 4:23 PM   Virtual Visit via Video Note   I, Jacqueline Alvarado, connected with  LEIYAH MAULTSBY  ([id number], 03-Dec-1995) on 08/01/24 at  4:15 PM EST by a video-enabled telemedicine application and verified that I am speaking with the correct person using two identifiers.  Location: Patient: Virtual Visit Location Patient:  Home Provider: Virtual Visit Location Provider: Home Office   I discussed the limitations of evaluation and management by telemedicine and the availability of in person appointments. The patient expressed understanding and agreed to proceed.    History of Present Illness: Jacqueline Alvarado is a 29 y.o. who identifies as a female who was assigned female at birth, and is being seen today for c/o wanting to know if she needs to go to the emergency room.  Pt states she has what she thinks is HS and has an abscess in her groin area and states the abscess has been growing and is bigger than when it first started in December. Pt states she was treated with two rounds of antibiotics orally which she felt might have made the abscess worse, back in December. Pt states the area is draining because it popped two days ago. Pt states she is not feeling well.  Pt states feels like she is coming down with a cold and just feeling fatigued.  Pt states she has a history of sepsis.  Pt states when she was septic it was due to something similar.  HPI: HPI  Problems:  Patient Active Problem List   Diagnosis Date Noted   High blood copper  level 06/22/2024   Gastroesophageal reflux disease 06/22/2024   Genetic testing 11/05/2023   Migraines 08/17/2023   Chronic pain of right knee 08/17/2023   Family history of ovarian cancer    Family history of melanoma    Iron  deficiency  anemia 01/07/2023   RBC microcytosis 10/28/2022   Breast pain, left 05/12/2022   Irregular periods 05/12/2022   Anxiety and depression 05/12/2022   Pregnancy examination or test, negative result 05/12/2022   Encounter for immunization 05/12/2022   Mass of upper inner quadrant of left breast 05/12/2022   Hidradenitis suppurativa 05/12/2022   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   Family history of neoplasm of breast 05/12/2022   Cellulitis of right thigh 09/06/2021   Elevated LFTs 09/06/2021   Essential  hypertension 11/07/2020   Papanicolaou smear of cervix with positive high risk human papilloma virus (HPV) test 05/31/2019   Menorrhagia with irregular cycle 05/24/2019   Acute cholecystitis 02/01/2017   Calculus of gallbladder with acute cholecystitis without obstruction    Biliary colic    Abdominal pain 01/29/2017   Gallstones    Inflammation of the retroperitoneum    Cicatrix    Rectal bleeding 12/20/2015   Hemorrhoids 12/20/2015   Hypertrophy of nasal turbinates 06/24/2013   Tonsillar hypertrophy 06/24/2013   Obstructive sleep apnea (adult) (pediatric) 06/24/2013   Morbid obesity (HCC) 06/24/2013    Allergies: Allergies[1] Medications: Current Medications[2]  Observations/Objective: Patient is well-developed, well-nourished in no acute distress.  Resting comfortably  at home.  Head is normocephalic, atraumatic.  No labored breathing.  Speech is clear and coherent with logical content.  Patient is alert and oriented at baseline.    Assessment and Plan: 1. Abscess of groin (Primary)  -I am unable to visualize the area and given that the patient is feeling unwell, the abscess is draining, growing in size and she has a history of having sepsis, Pt was recommended to be seen in person at the hospital for further evaluation  -Pt verbalized understanding.   Follow Up Instructions: I discussed the assessment and treatment plan with the patient. The patient was provided an opportunity to ask questions and all were answered. The patient agreed with the plan and demonstrated an understanding of the instructions.  A copy of instructions were sent to the patient via MyChart unless otherwise noted below.    The patient was advised to call back or seek an in-person evaluation if the symptoms worsen or if the condition fails to improve as anticipated.    Jacqueline Mater, PA-C     [1] No Known Allergies [2]  Current Outpatient Medications:    ACCUTANE 40 MG capsule, , Disp: , Rfl:     cholecalciferol (VITAMIN D3) 25 MCG (1000 UNIT) tablet, , Disp: , Rfl:    HYDROcodone -acetaminophen  (NORCO/VICODIN) 5-325 MG tablet, One tablet every six hours for pain.  Limit 7 days., Disp: 28 tablet, Rfl: 0   lisinopril  (ZESTRIL ) 20 MG tablet, Take 1 tablet by mouth once daily, Disp: 30 tablet, Rfl: 3   LORYNA 3-0.02 MG tablet, Take 1 tablet by mouth daily., Disp: , Rfl:    omeprazole  (PRILOSEC) 40 MG capsule, Take 1 capsule (40 mg total) by mouth daily., Disp: 30 capsule, Rfl: 5   rizatriptan  (MAXALT ) 10 MG tablet, Take 1 tablet (10 mg total) by mouth as needed for migraine. May repeat in 2 hours if needed, Disp: 10 tablet, Rfl: 2   sertraline  (ZOLOFT ) 25 MG tablet, Take 3 tablets by mouth once daily, Disp: 90 tablet, Rfl: 1   tretinoin (RETIN-A) 0.025 % cream, SMARTSIG:sparingly Topical Daily PRN, Disp: , Rfl:   "

## 2024-08-02 ENCOUNTER — Ambulatory Visit (HOSPITAL_COMMUNITY)
Admission: RE | Admit: 2024-08-02 | Discharge: 2024-08-02 | Disposition: A | Source: Ambulatory Visit | Attending: Internal Medicine

## 2024-08-02 ENCOUNTER — Ambulatory Visit: Admitting: Internal Medicine

## 2024-08-02 ENCOUNTER — Ambulatory Visit (HOSPITAL_COMMUNITY)
Admission: RE | Admit: 2024-08-02 | Discharge: 2024-08-02 | Disposition: A | Discharge status: Home / Self Care | Source: Ambulatory Visit | Attending: Internal Medicine | Admitting: Internal Medicine

## 2024-08-02 ENCOUNTER — Other Ambulatory Visit (HOSPITAL_COMMUNITY): Payer: Self-pay | Admitting: Internal Medicine

## 2024-08-02 DIAGNOSIS — N63 Unspecified lump in unspecified breast: Secondary | ICD-10-CM | POA: Insufficient documentation

## 2024-08-02 DIAGNOSIS — R928 Other abnormal and inconclusive findings on diagnostic imaging of breast: Secondary | ICD-10-CM

## 2024-08-02 DIAGNOSIS — N6322 Unspecified lump in the left breast, upper inner quadrant: Secondary | ICD-10-CM | POA: Insufficient documentation

## 2024-08-16 ENCOUNTER — Ambulatory Visit (HOSPITAL_COMMUNITY): Admission: RE | Admit: 2024-08-16 | Source: Ambulatory Visit

## 2024-08-16 ENCOUNTER — Other Ambulatory Visit (HOSPITAL_COMMUNITY): Payer: Self-pay | Admitting: Internal Medicine

## 2024-08-16 ENCOUNTER — Ambulatory Visit (HOSPITAL_COMMUNITY)
Admission: RE | Admit: 2024-08-16 | Discharge: 2024-08-16 | Disposition: A | Source: Ambulatory Visit | Attending: Internal Medicine

## 2024-08-16 ENCOUNTER — Encounter (HOSPITAL_COMMUNITY): Payer: Self-pay

## 2024-08-16 DIAGNOSIS — R928 Other abnormal and inconclusive findings on diagnostic imaging of breast: Secondary | ICD-10-CM

## 2024-08-16 DIAGNOSIS — D241 Benign neoplasm of right breast: Secondary | ICD-10-CM | POA: Insufficient documentation

## 2024-08-16 MED ORDER — LIDOCAINE HCL (PF) 2 % IJ SOLN
INTRAMUSCULAR | Status: AC
  Filled 2024-08-16: qty 10

## 2024-08-16 MED ORDER — LIDOCAINE-EPINEPHRINE (PF) 1 %-1:200000 IJ SOLN
INTRAMUSCULAR | Status: AC
  Filled 2024-08-16: qty 30

## 2024-08-16 MED ORDER — LIDOCAINE-EPINEPHRINE (PF) 1 %-1:200000 IJ SOLN
30.0000 mL | Freq: Once | INTRAMUSCULAR | Status: AC
  Administered 2024-08-16: 10 mL via INTRADERMAL

## 2024-08-16 MED ORDER — LIDOCAINE HCL (PF) 2 % IJ SOLN
10.0000 mL | Freq: Once | INTRAMUSCULAR | Status: AC
  Administered 2024-08-16: 10 mL via INTRADERMAL

## 2024-08-16 NOTE — Progress Notes (Signed)
 Patient brought rto US  Rm3 in no acute distress. Breast biopsy explained. Post procedure care, using ice packs discussed. Consent obtained. Prepped and draped with sterile technique. US  imaging obtained. Local anesthetic admin without adverse effect. Access obtained, samples collected and preserved. Access removed, bandage placed. No bleeding/hematoma evident. Transported to mammogram for imaging.

## 2024-08-17 LAB — SURGICAL PATHOLOGY

## 2024-08-24 ENCOUNTER — Ambulatory Visit: Admitting: Adult Health
